# Patient Record
Sex: Female | Born: 1965 | Race: Black or African American | Hispanic: No | Marital: Single | Smoking: Former smoker
Health system: Southern US, Community
[De-identification: ages and names within clinical notes are randomized; demographics above are authoritative.]

## PROBLEM LIST (undated history)

## (undated) DIAGNOSIS — K2970 Gastritis, unspecified, without bleeding: Secondary | ICD-10-CM

## (undated) DIAGNOSIS — K589 Irritable bowel syndrome without diarrhea: Secondary | ICD-10-CM

## (undated) DIAGNOSIS — D869 Sarcoidosis, unspecified: Secondary | ICD-10-CM

## (undated) DIAGNOSIS — B9681 Helicobacter pylori [H. pylori] as the cause of diseases classified elsewhere: Secondary | ICD-10-CM

## (undated) DIAGNOSIS — R319 Hematuria, unspecified: Secondary | ICD-10-CM

## (undated) DIAGNOSIS — R5383 Other fatigue: Secondary | ICD-10-CM

## (undated) DIAGNOSIS — K219 Gastro-esophageal reflux disease without esophagitis: Secondary | ICD-10-CM

## (undated) DIAGNOSIS — I639 Cerebral infarction, unspecified: Secondary | ICD-10-CM

## (undated) DIAGNOSIS — N898 Other specified noninflammatory disorders of vagina: Secondary | ICD-10-CM

## (undated) DIAGNOSIS — I10 Essential (primary) hypertension: Secondary | ICD-10-CM

## (undated) DIAGNOSIS — N760 Acute vaginitis: Secondary | ICD-10-CM

## (undated) DIAGNOSIS — N83209 Unspecified ovarian cyst, unspecified side: Principal | ICD-10-CM

## (undated) DIAGNOSIS — R87810 Cervical high risk human papillomavirus (HPV) DNA test positive: Principal | ICD-10-CM

## (undated) DIAGNOSIS — K649 Unspecified hemorrhoids: Secondary | ICD-10-CM

## (undated) DIAGNOSIS — N189 Chronic kidney disease, unspecified: Secondary | ICD-10-CM

## (undated) DIAGNOSIS — K5900 Constipation, unspecified: Secondary | ICD-10-CM

## (undated) DIAGNOSIS — B9689 Other specified bacterial agents as the cause of diseases classified elsewhere: Secondary | ICD-10-CM

## (undated) DIAGNOSIS — E1340 Other specified diabetes mellitus with diabetic neuropathy, unspecified: Secondary | ICD-10-CM

## (undated) DIAGNOSIS — N939 Abnormal uterine and vaginal bleeding, unspecified: Principal | ICD-10-CM

## (undated) DIAGNOSIS — D219 Benign neoplasm of connective and other soft tissue, unspecified: Secondary | ICD-10-CM

## (undated) HISTORY — DX: Other specified noninflammatory disorders of vagina: N89.8

## (undated) HISTORY — PX: CHOLECYSTECTOMY: SHX55

## (undated) HISTORY — DX: Chronic kidney disease, unspecified: N18.9

## (undated) HISTORY — DX: Acute vaginitis: N76.0

## (undated) HISTORY — DX: Constipation, unspecified: K59.00

## (undated) HISTORY — DX: Cerebral infarction, unspecified: I63.9

## (undated) HISTORY — PX: HEMORRHOID SURGERY: SHX153

## (undated) HISTORY — DX: Hematuria, unspecified: R31.9

## (undated) HISTORY — DX: Unspecified ovarian cyst, unspecified side: N83.209

## (undated) HISTORY — DX: Benign neoplasm of connective and other soft tissue, unspecified: D21.9

## (undated) HISTORY — PX: TUBAL LIGATION: SHX77

## (undated) HISTORY — DX: Abnormal uterine and vaginal bleeding, unspecified: N93.9

## (undated) HISTORY — PX: ENDOMETRIAL ABLATION: SHX621

## (undated) HISTORY — DX: Unspecified hemorrhoids: K64.9

## (undated) HISTORY — DX: Other specified bacterial agents as the cause of diseases classified elsewhere: B96.89

## (undated) HISTORY — DX: Other fatigue: R53.83

## (undated) HISTORY — DX: Cervical high risk human papillomavirus (HPV) DNA test positive: R87.810

---

## 1999-01-04 ENCOUNTER — Ambulatory Visit: Admission: RE | Admit: 1999-01-04 | Discharge: 1999-01-04 | Payer: Self-pay | Admitting: Pulmonary Disease

## 2000-11-20 ENCOUNTER — Ambulatory Visit (HOSPITAL_COMMUNITY): Admission: RE | Admit: 2000-11-20 | Discharge: 2000-11-20 | Payer: Self-pay | Admitting: Pulmonary Disease

## 2001-08-12 ENCOUNTER — Ambulatory Visit (HOSPITAL_COMMUNITY): Admission: RE | Admit: 2001-08-12 | Discharge: 2001-08-12 | Payer: Self-pay | Admitting: Pulmonary Disease

## 2001-08-12 ENCOUNTER — Encounter: Payer: Self-pay | Admitting: Pulmonary Disease

## 2001-10-14 ENCOUNTER — Ambulatory Visit (HOSPITAL_COMMUNITY): Admission: RE | Admit: 2001-10-14 | Discharge: 2001-10-14 | Payer: Self-pay | Admitting: Pulmonary Disease

## 2001-10-14 ENCOUNTER — Encounter: Payer: Self-pay | Admitting: Pulmonary Disease

## 2001-12-15 ENCOUNTER — Encounter: Payer: Self-pay | Admitting: *Deleted

## 2001-12-15 ENCOUNTER — Emergency Department (HOSPITAL_COMMUNITY): Admission: EM | Admit: 2001-12-15 | Discharge: 2001-12-15 | Payer: Self-pay | Admitting: Emergency Medicine

## 2004-10-01 ENCOUNTER — Emergency Department (HOSPITAL_COMMUNITY): Admission: EM | Admit: 2004-10-01 | Discharge: 2004-10-01 | Payer: Self-pay | Admitting: *Deleted

## 2004-10-06 ENCOUNTER — Emergency Department (HOSPITAL_COMMUNITY): Admission: EM | Admit: 2004-10-06 | Discharge: 2004-10-06 | Payer: Self-pay | Admitting: Emergency Medicine

## 2005-01-11 ENCOUNTER — Other Ambulatory Visit: Admission: RE | Admit: 2005-01-11 | Discharge: 2005-01-11 | Payer: Self-pay | Admitting: Obstetrics & Gynecology

## 2005-01-31 ENCOUNTER — Ambulatory Visit (HOSPITAL_COMMUNITY): Admission: RE | Admit: 2005-01-31 | Discharge: 2005-01-31 | Payer: Self-pay | Admitting: Obstetrics & Gynecology

## 2005-12-12 ENCOUNTER — Ambulatory Visit (HOSPITAL_COMMUNITY): Admission: RE | Admit: 2005-12-12 | Discharge: 2005-12-12 | Payer: Self-pay | Admitting: Obstetrics and Gynecology

## 2005-12-18 ENCOUNTER — Emergency Department (HOSPITAL_COMMUNITY): Admission: EM | Admit: 2005-12-18 | Discharge: 2005-12-18 | Payer: Self-pay | Admitting: Emergency Medicine

## 2006-03-20 ENCOUNTER — Encounter (INDEPENDENT_AMBULATORY_CARE_PROVIDER_SITE_OTHER): Payer: Self-pay | Admitting: *Deleted

## 2006-03-20 ENCOUNTER — Ambulatory Visit (HOSPITAL_COMMUNITY): Admission: RE | Admit: 2006-03-20 | Discharge: 2006-03-20 | Payer: Self-pay | Admitting: Obstetrics & Gynecology

## 2006-12-16 ENCOUNTER — Ambulatory Visit (HOSPITAL_COMMUNITY): Admission: RE | Admit: 2006-12-16 | Discharge: 2006-12-16 | Payer: Self-pay | Admitting: Pulmonary Disease

## 2007-04-29 ENCOUNTER — Ambulatory Visit (HOSPITAL_COMMUNITY): Admission: RE | Admit: 2007-04-29 | Discharge: 2007-04-29 | Payer: Self-pay | Admitting: Optometry

## 2007-09-15 ENCOUNTER — Emergency Department (HOSPITAL_COMMUNITY): Admission: EM | Admit: 2007-09-15 | Discharge: 2007-09-15 | Payer: Self-pay | Admitting: Emergency Medicine

## 2007-11-03 ENCOUNTER — Other Ambulatory Visit: Admission: RE | Admit: 2007-11-03 | Discharge: 2007-11-03 | Payer: Self-pay | Admitting: Obstetrics and Gynecology

## 2007-12-17 ENCOUNTER — Ambulatory Visit (HOSPITAL_COMMUNITY): Admission: RE | Admit: 2007-12-17 | Discharge: 2007-12-17 | Payer: Self-pay | Admitting: Obstetrics and Gynecology

## 2008-12-21 ENCOUNTER — Ambulatory Visit (HOSPITAL_COMMUNITY): Admission: RE | Admit: 2008-12-21 | Discharge: 2008-12-21 | Payer: Self-pay | Admitting: Obstetrics and Gynecology

## 2009-01-31 ENCOUNTER — Other Ambulatory Visit: Admission: RE | Admit: 2009-01-31 | Discharge: 2009-01-31 | Payer: Self-pay | Admitting: Obstetrics & Gynecology

## 2009-12-22 ENCOUNTER — Ambulatory Visit (HOSPITAL_COMMUNITY): Admission: RE | Admit: 2009-12-22 | Discharge: 2009-12-22 | Payer: Self-pay | Admitting: Obstetrics & Gynecology

## 2010-02-06 ENCOUNTER — Other Ambulatory Visit: Admission: RE | Admit: 2010-02-06 | Discharge: 2010-02-06 | Payer: Self-pay | Admitting: Obstetrics & Gynecology

## 2010-05-16 ENCOUNTER — Emergency Department (HOSPITAL_COMMUNITY)
Admission: EM | Admit: 2010-05-16 | Discharge: 2010-05-16 | Disposition: A | Discharge status: Home / Self Care | Payer: Medicare Other | Attending: Emergency Medicine | Admitting: Emergency Medicine

## 2010-05-16 DIAGNOSIS — E7800 Pure hypercholesterolemia, unspecified: Secondary | ICD-10-CM | POA: Insufficient documentation

## 2010-05-16 DIAGNOSIS — R109 Unspecified abdominal pain: Secondary | ICD-10-CM | POA: Insufficient documentation

## 2010-05-16 DIAGNOSIS — K5289 Other specified noninfective gastroenteritis and colitis: Secondary | ICD-10-CM | POA: Insufficient documentation

## 2010-05-16 DIAGNOSIS — J99 Respiratory disorders in diseases classified elsewhere: Secondary | ICD-10-CM | POA: Insufficient documentation

## 2010-05-16 DIAGNOSIS — Z79899 Other long term (current) drug therapy: Secondary | ICD-10-CM | POA: Insufficient documentation

## 2010-05-16 DIAGNOSIS — R110 Nausea: Secondary | ICD-10-CM | POA: Insufficient documentation

## 2010-05-16 DIAGNOSIS — I10 Essential (primary) hypertension: Secondary | ICD-10-CM | POA: Insufficient documentation

## 2010-05-16 DIAGNOSIS — D869 Sarcoidosis, unspecified: Secondary | ICD-10-CM | POA: Insufficient documentation

## 2010-05-16 LAB — DIFFERENTIAL
Lymphs Abs: 2.1 10*3/uL (ref 0.7–4.0)
Monocytes Absolute: 0.7 10*3/uL (ref 0.1–1.0)
Monocytes Relative: 10 % (ref 3–12)
Neutro Abs: 4.5 10*3/uL (ref 1.7–7.7)
Neutrophils Relative %: 61 % (ref 43–77)

## 2010-05-16 LAB — URINALYSIS, ROUTINE W REFLEX MICROSCOPIC
Bilirubin Urine: NEGATIVE
Ketones, ur: NEGATIVE mg/dL
Nitrite: NEGATIVE
Specific Gravity, Urine: 1.025 (ref 1.005–1.030)
Urobilinogen, UA: 0.2 mg/dL (ref 0.0–1.0)

## 2010-05-16 LAB — BASIC METABOLIC PANEL
BUN: 6 mg/dL (ref 6–23)
CO2: 25 mEq/L (ref 19–32)
Chloride: 104 mEq/L (ref 96–112)
GFR calc Af Amer: >60 mL/min (ref >60)
Potassium: 3.7 mEq/L (ref 3.5–5.1)

## 2010-05-16 LAB — CBC
Hemoglobin: 13.6 g/dL (ref 12.0–15.0)
MCH: 29.1 pg (ref 26.0–34.0)
MCHC: 34.3 g/dL (ref 30.0–36.0)
MCV: 84.8 fL (ref 78.0–100.0)
RBC: 4.68 MIL/uL (ref 3.87–5.11)

## 2010-05-16 LAB — URINE MICROSCOPIC-ADD ON

## 2010-07-29 ENCOUNTER — Emergency Department (HOSPITAL_COMMUNITY)
Admission: EM | Admit: 2010-07-29 | Discharge: 2010-07-29 | Disposition: A | Discharge status: Home / Self Care | Payer: Medicare Other | Attending: Emergency Medicine | Admitting: Emergency Medicine

## 2010-07-29 DIAGNOSIS — IMO0002 Reserved for concepts with insufficient information to code with codable children: Secondary | ICD-10-CM | POA: Insufficient documentation

## 2010-07-29 DIAGNOSIS — I10 Essential (primary) hypertension: Secondary | ICD-10-CM | POA: Insufficient documentation

## 2010-07-29 DIAGNOSIS — E7800 Pure hypercholesterolemia, unspecified: Secondary | ICD-10-CM | POA: Insufficient documentation

## 2010-07-29 DIAGNOSIS — Z79899 Other long term (current) drug therapy: Secondary | ICD-10-CM | POA: Insufficient documentation

## 2010-07-29 DIAGNOSIS — D869 Sarcoidosis, unspecified: Secondary | ICD-10-CM | POA: Insufficient documentation

## 2010-07-29 DIAGNOSIS — N959 Unspecified menopausal and perimenopausal disorder: Secondary | ICD-10-CM | POA: Insufficient documentation

## 2010-07-29 DIAGNOSIS — N760 Acute vaginitis: Secondary | ICD-10-CM | POA: Insufficient documentation

## 2010-07-29 LAB — URINALYSIS, ROUTINE W REFLEX MICROSCOPIC
Bilirubin Urine: NEGATIVE
Glucose, UA: NEGATIVE mg/dL
Ketones, ur: NEGATIVE mg/dL
pH: 5.5 (ref 5.0–8.0)

## 2010-07-29 LAB — BASIC METABOLIC PANEL
BUN: 8 mg/dL (ref 6–23)
GFR calc non Af Amer: >60 mL/min (ref >60)
Potassium: 3.2 mEq/L — ABNORMAL LOW (ref 3.5–5.1)

## 2010-07-29 LAB — CBC
HCT: 36.5 % (ref 36.0–46.0)
Hemoglobin: 12.7 g/dL (ref 12.0–15.0)
MCV: 84.9 fL (ref 78.0–100.0)
Platelets: 228 10*3/uL (ref 150–400)
RBC: 4.3 MIL/uL (ref 3.87–5.11)
WBC: 6.9 10*3/uL (ref 4.0–10.5)

## 2010-07-29 LAB — DIFFERENTIAL
Eosinophils Absolute: 0 10*3/uL (ref 0.0–0.7)
Lymphocytes Relative: 24 % (ref 12–46)
Lymphs Abs: 1.7 10*3/uL (ref 0.7–4.0)
Neutro Abs: 4.5 10*3/uL (ref 1.7–7.7)
Neutrophils Relative %: 65 % (ref 43–77)

## 2010-07-29 LAB — RPR: RPR Ser Ql: NONREACTIVE

## 2010-07-29 LAB — WET PREP, GENITAL: WBC, Wet Prep HPF POC: NONE SEEN

## 2010-07-29 LAB — URINE MICROSCOPIC-ADD ON

## 2010-07-31 ENCOUNTER — Other Ambulatory Visit (HOSPITAL_COMMUNITY): Payer: Self-pay | Admitting: Optometry

## 2010-07-31 DIAGNOSIS — IMO0002 Reserved for concepts with insufficient information to code with codable children: Secondary | ICD-10-CM

## 2010-08-07 ENCOUNTER — Encounter (HOSPITAL_COMMUNITY)
Admission: RE | Admit: 2010-08-07 | Discharge: 2010-08-07 | Disposition: A | Discharge status: Home / Self Care | Payer: Medicare Other | Source: Ambulatory Visit | Attending: Optometry | Admitting: Optometry

## 2010-08-07 ENCOUNTER — Other Ambulatory Visit (HOSPITAL_COMMUNITY): Payer: Self-pay | Admitting: Optometry

## 2010-08-07 DIAGNOSIS — R222 Localized swelling, mass and lump, trunk: Secondary | ICD-10-CM | POA: Insufficient documentation

## 2010-08-07 DIAGNOSIS — J984 Other disorders of lung: Secondary | ICD-10-CM | POA: Insufficient documentation

## 2010-08-07 DIAGNOSIS — IMO0002 Reserved for concepts with insufficient information to code with codable children: Secondary | ICD-10-CM

## 2010-08-07 DIAGNOSIS — Z79899 Other long term (current) drug therapy: Secondary | ICD-10-CM | POA: Insufficient documentation

## 2010-08-07 DIAGNOSIS — M899 Disorder of bone, unspecified: Secondary | ICD-10-CM | POA: Insufficient documentation

## 2010-08-07 DIAGNOSIS — M7989 Other specified soft tissue disorders: Secondary | ICD-10-CM | POA: Insufficient documentation

## 2010-08-07 LAB — GLUCOSE, CAPILLARY: Glucose-Capillary: 97 mg/dL (ref 70–99)

## 2010-08-07 MED ORDER — FLUDEOXYGLUCOSE F - 18 (FDG) INJECTION
18.3000 | Freq: Once | INTRAVENOUS | Status: AC | PRN
  Administered 2010-08-07: 18.3 via INTRAVENOUS

## 2010-08-18 NOTE — Op Note (Signed)
[name], [name]            ACCOUNT NO.:  [id number]   MEDICAL RECORD NO.:  [id number]          PATIENT TYPE:  AMB   LOCATION:  DAY                           FACILITY:  [facility]   PHYSICIAN:  Lazaro Arms, M.D.   DATE OF BIRTH:  Oct 22, 1965   DATE OF PROCEDURE:  03/20/2006  DATE OF DISCHARGE:                               OPERATIVE REPORT   PREOPERATIVE DIAGNOSES:  1. Menometrorrhagia.  2. Dysmenorrhea.   POSTOPERATIVE DIAGNOSES:  1. Menometrorrhagia.  2. Dysmenorrhea.   PROCEDURE:  1. Hysteroscopy.  2. Dilatation and curettage.  3. Endometrial ablation.   SURGEON:  Lazaro Arms, M.D.   ANESTHESIA:  General endotracheal.   FINDINGS:  The patient had a normal endometrial cavity.  No fibroids, no  polyps.  No abnormalities.   DESCRIPTION OF PROCEDURE:  The patient was taken to the operating room  and placed in the supine position.  She underwent general endotracheal  anesthesia.  She was then placed in the dorsal lithotomy position and  prepped and draped in the usual sterile fashion.  A graded speculum was  placed.  The cervix was grasped.  The cervix was dilated serially to  __________ with the hysteroscope.  Hysteroscopy was performed and there  was a completely normal endometrial cavity.  A vigorous uterine  curettage was then performed and there was good uterine cry obtained in  all areas.  The ThermaChoice-3 endometrial ablation balloon was then  placed.  It required 32 mL of D-5-W to maintain a pressure to 192 mmHg.  It was heated to 87 degrees Celsius.  The total therapy time was 10  minutes and 9 seconds.  Fluid was removed at the end of the procedure.   The patient tolerated the procedure well.  She experienced minimal blood  loss and was taken to the recovery room in good stable condition.  All  counts were correct x3.      Lazaro Arms, M.D.  Electronically Signed     LHE/MEDQ  D:  03/20/2006  T:  03/20/2006  Job:  [id number]

## 2010-08-18 NOTE — Op Note (Signed)
[name], [name]            ACCOUNT NO.:  [id number]   MEDICAL RECORD NO.:  [id number]          PATIENT TYPE:  AMB   LOCATION:  DAY                           FACILITY:  [facility]   PHYSICIAN:  Lazaro Arms, M.D.   DATE OF BIRTH:  [birth year]   DATE OF PROCEDURE:  01/31/2005  DATE OF DISCHARGE:                                 OPERATIVE REPORT   PREOPERATIVE DIAGNOSIS:  Moderate cervical dysplasia.   POSTOPERATIVE DIAGNOSIS:  Moderate cervical dysplasia.   OPERATION PERFORMED:  Laser ablation of the cervix.   SURGEON:  Lazaro Arms, M.D.   ANESTHESIA:  MAC.   FINDINGS:  The patient had a colposcopic directed biopsy in the office that  came back moderate dysplasia. This certainly looked high grade and her Pap  smear showed high grade.  As a result we proceeded with laser ablation to be  most conservative.   DESCRIPTION OF PROCEDURE:  The patient was taken to the operating room and  placed in supine position where she underwent IV sedation. She was placed in  dorsal lithotomy position.  I did a paracervical block using 0.5% Marcaine.  3% acetic acid was placed.  The microscope was used and the holmium laser  was used to ablate the cervix to a depth of 5 to 7 mm laterally and 7 to 9  mm centrally in conical fashion. There was no bleeding.  The patient  tolerated the procedure well.  She was taken to recovery room in good and  stable condition.  All counts were correct.      Lazaro Arms, M.D.  Electronically Signed     LHE/MEDQ  D:  01/31/2005  T:  01/31/2005  Job:  [id number]

## 2010-08-18 NOTE — H&P (Signed)
[name], [name] NO.:  [id number]   MEDICAL RECORD NO.:  [id number]          PATIENT TYPE:  AMB   LOCATION:                                FACILITY:  [facility]   PHYSICIAN:  Lazaro Arms, M.D.   DATE OF BIRTH:  December 10, 1965   DATE OF ADMISSION:  DATE OF DISCHARGE:  LH                                HISTORY & PHYSICAL   HISTORY OF PRESENT ILLNESS:  [name] is a 45 year old African American  female, gravida 3, para 2, status post tubal ligation who had an abnormal  Pap smear obtained in our office in September.  It was an ASCUS Pap smear  but could not exclude high-grade dysplasia with positive HPV.  Subsequently  I did a colposcopy with directed biopsies on December 12, 2004 which  returned as moderate grade dysplasia.  However, to me it actually looked  more severe with punctation and mosaicism  and dense acetowhite changes.  As  a result we are going to proceed with the laser ablation of the cervix.   PAST MEDICAL HISTORY:  Negative.   PAST SURGICAL HISTORY:  Tubal ligation.   OB HISTORY:  Vaginal deliveries and a miscarriage.   ALLERGIES:  CODEINE.   MEDICATIONS:  None listed.   REVIEW OF SYSTEMS:  Otherwise negative.   PHYSICAL EXAMINATION:  HEENT:  Unremarkable.  NECK:  Thyroid normal.  LUNGS:  Clear.  HEART:  Regular rate and rhythm.  No murmur, rub, or gallop.  BREASTS:  Deferred.  ABDOMEN:  Benign.  No hepatosplenomegaly or masses.  GU:  Normal external genitalia.  Vagina pink, moist  without discharge.  PELVIC:  Cervix parous without  lesions.  Uterus normal size, shape and  contour.  Ovaries normal, nontender.  EXTREMITIES:  Warm with no edema.  NEUROLOGIC:  Grossly intact.   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   PLAN:  The patient is admitted for laser ablation of the cervix.  She  understands the risks, benefits, indications, alternatives and will proceed.      Lazaro Arms, M.D.  Electronically Signed     LHE/MEDQ  D:  01/30/2005  T:  01/31/2005  Job:  [id number]

## 2010-10-19 ENCOUNTER — Encounter: Payer: Self-pay | Admitting: *Deleted

## 2010-10-19 DIAGNOSIS — K649 Unspecified hemorrhoids: Secondary | ICD-10-CM | POA: Insufficient documentation

## 2010-10-19 NOTE — ED Notes (Signed)
Hemorrhoid pain

## 2010-10-20 ENCOUNTER — Emergency Department (HOSPITAL_COMMUNITY)
Admission: EM | Admit: 2010-10-20 | Discharge: 2010-10-20 | Discharge status: Against Medical Advice | Payer: Medicare Other | Attending: Emergency Medicine | Admitting: Emergency Medicine

## 2010-10-20 DIAGNOSIS — K644 Residual hemorrhoidal skin tags: Secondary | ICD-10-CM

## 2010-10-20 HISTORY — DX: Sarcoidosis, unspecified: D86.9

## 2010-10-20 HISTORY — DX: Essential (primary) hypertension: I10

## 2010-10-20 MED ORDER — HYDROCODONE-ACETAMINOPHEN 5-325 MG PO TABS
1.0000 | ORAL_TABLET | Freq: Once | ORAL | Status: AC
  Administered 2010-10-20: 1 via ORAL
  Filled 2010-10-20: qty 1

## 2010-10-20 MED ORDER — HYDROCORTISONE ACETATE 25 MG RE SUPP
25.0000 mg | Freq: Two times a day (BID) | RECTAL | Status: DC
  Administered 2010-10-20: 25 mg via RECTAL
  Filled 2010-10-20: qty 1

## 2010-10-20 MED ORDER — HYDROCORTISONE ACETATE 25 MG RE SUPP
RECTAL | Status: AC
  Filled 2010-10-20: qty 1

## 2010-10-20 NOTE — ED Provider Notes (Signed)
History     Chief Complaint  Patient presents with  . Hemorrhoids   HPI Comments: Patient with a h/o hemorrhoids who is here with increased pain and bleeding of external hemorrhoids. She has a Rx for anusol suppositories however has not used them in several days. Denies fever, chills, abdominal pain. States stools are not hard but are not diarrhea. Patient with annual colonoscopies for until recently for continued c/o pain.  The history is provided by the patient.    Past Medical History  Diagnosis Date  . Sarcoidosis   . Diabetes mellitus   . Hypertension   . Asthma     Past Surgical History  Procedure Date  . Tubal ligation   . Endometrial ablation   . Cholecystectomy     Family History  Problem Relation Age of Onset  . Diabetes Father     History  Substance Use Topics  . Smoking status: Former Games developer  . Smokeless tobacco: Not on file  . Alcohol Use: No    OB History    Grav Para Term Preterm Abortions TAB SAB Ect Mult Living                  Review of Systems  Gastrointestinal: Positive for rectal pain.  All other systems reviewed and are negative.    Physical Exam  BP 126/81  Pulse 112  Temp(Src) 98.3 F (36.8 C) (Oral)  Resp 16  Ht 5\' 6"  (1.676 m)  Wt 189 lb (85.73 kg)  BMI 30.51 kg/m2  SpO2 99%  LMP 10/18/2005  Physical Exam  Nursing note and vitals reviewed. Constitutional: She is oriented to person, place, and time. She appears well-developed and well-nourished.  HENT:  Head: Normocephalic and atraumatic.  Right Ear: External ear normal.  Left Ear: External ear normal.  Eyes: EOM are normal. Pupils are equal, round, and reactive to light.  Neck: Normal range of motion. Neck supple.  Cardiovascular: Regular rhythm and normal heart sounds.        tachycardia  Pulmonary/Chest: Effort normal and breath sounds normal.  Abdominal: Soft. Bowel sounds are normal.       External hemorrhoids. Irritated, no thrombosis.  Musculoskeletal: Normal  range of motion.  Neurological: She is alert and oriented to person, place, and time. She has normal reflexes.  Skin: Skin is warm and dry.    ED Course  Procedures  MDM       Nicoletta Dress. Colon Branch, MD 10/20/10 [phone]

## 2010-11-16 ENCOUNTER — Other Ambulatory Visit: Payer: Self-pay | Admitting: Obstetrics & Gynecology

## 2010-11-16 DIAGNOSIS — Z139 Encounter for screening, unspecified: Secondary | ICD-10-CM

## 2010-12-25 ENCOUNTER — Ambulatory Visit (HOSPITAL_COMMUNITY)
Admission: RE | Admit: 2010-12-25 | Discharge: 2010-12-25 | Disposition: A | Discharge status: Home / Self Care | Payer: Medicare Other | Source: Ambulatory Visit | Attending: Obstetrics & Gynecology | Admitting: Obstetrics & Gynecology

## 2010-12-25 DIAGNOSIS — Z1231 Encounter for screening mammogram for malignant neoplasm of breast: Secondary | ICD-10-CM | POA: Insufficient documentation

## 2010-12-25 DIAGNOSIS — Z139 Encounter for screening, unspecified: Secondary | ICD-10-CM

## 2010-12-28 LAB — COMPREHENSIVE METABOLIC PANEL
ALT: 36 — ABNORMAL HIGH
AST: 42 — ABNORMAL HIGH
Albumin: 3.6
Alkaline Phosphatase: 59
CO2: 29
Chloride: 103
GFR calc Af Amer: >60
GFR calc non Af Amer: >60
Potassium: 3.7
Sodium: 138
Total Bilirubin: 0.7

## 2010-12-28 LAB — CBC
MCV: 89.3
RBC: 4.41
WBC: 8.7

## 2010-12-28 LAB — DIFFERENTIAL
Basophils Absolute: 0
Eosinophils Absolute: 0
Eosinophils Relative: 0
Monocytes Absolute: 0.3

## 2010-12-28 LAB — D-DIMER, QUANTITATIVE: D-Dimer, Quant: 0.33

## 2011-02-09 ENCOUNTER — Other Ambulatory Visit: Payer: Self-pay | Admitting: Adult Health

## 2011-02-09 ENCOUNTER — Other Ambulatory Visit (HOSPITAL_COMMUNITY)
Admission: RE | Admit: 2011-02-09 | Discharge: 2011-02-09 | Disposition: A | Discharge status: Home / Self Care | Payer: Medicare Other | Source: Ambulatory Visit | Attending: Obstetrics and Gynecology | Admitting: Obstetrics and Gynecology

## 2011-02-09 DIAGNOSIS — Z124 Encounter for screening for malignant neoplasm of cervix: Secondary | ICD-10-CM | POA: Insufficient documentation

## 2011-04-13 DIAGNOSIS — D869 Sarcoidosis, unspecified: Secondary | ICD-10-CM | POA: Diagnosis not present

## 2011-04-16 DIAGNOSIS — H04129 Dry eye syndrome of unspecified lacrimal gland: Secondary | ICD-10-CM | POA: Diagnosis not present

## 2011-04-19 DIAGNOSIS — R222 Localized swelling, mass and lump, trunk: Secondary | ICD-10-CM | POA: Diagnosis not present

## 2011-04-19 DIAGNOSIS — J984 Other disorders of lung: Secondary | ICD-10-CM | POA: Diagnosis not present

## 2011-04-19 DIAGNOSIS — D152 Benign neoplasm of mediastinum: Secondary | ICD-10-CM | POA: Diagnosis not present

## 2011-04-19 DIAGNOSIS — R0609 Other forms of dyspnea: Secondary | ICD-10-CM | POA: Diagnosis not present

## 2011-04-19 DIAGNOSIS — R0602 Shortness of breath: Secondary | ICD-10-CM | POA: Diagnosis not present

## 2011-04-19 DIAGNOSIS — D869 Sarcoidosis, unspecified: Secondary | ICD-10-CM | POA: Diagnosis not present

## 2011-05-18 DIAGNOSIS — IMO0001 Reserved for inherently not codable concepts without codable children: Secondary | ICD-10-CM | POA: Diagnosis not present

## 2011-05-18 DIAGNOSIS — D869 Sarcoidosis, unspecified: Secondary | ICD-10-CM | POA: Diagnosis not present

## 2011-05-18 DIAGNOSIS — E782 Mixed hyperlipidemia: Secondary | ICD-10-CM | POA: Diagnosis not present

## 2011-05-18 DIAGNOSIS — K2970 Gastritis, unspecified, without bleeding: Secondary | ICD-10-CM | POA: Diagnosis not present

## 2011-05-18 DIAGNOSIS — I209 Angina pectoris, unspecified: Secondary | ICD-10-CM | POA: Diagnosis not present

## 2011-05-18 DIAGNOSIS — G932 Benign intracranial hypertension: Secondary | ICD-10-CM | POA: Diagnosis not present

## 2011-05-18 DIAGNOSIS — K644 Residual hemorrhoidal skin tags: Secondary | ICD-10-CM | POA: Diagnosis not present

## 2011-05-18 DIAGNOSIS — K2990 Gastroduodenitis, unspecified, without bleeding: Secondary | ICD-10-CM | POA: Diagnosis not present

## 2011-05-18 DIAGNOSIS — J209 Acute bronchitis, unspecified: Secondary | ICD-10-CM | POA: Diagnosis not present

## 2011-05-18 DIAGNOSIS — J984 Other disorders of lung: Secondary | ICD-10-CM | POA: Diagnosis not present

## 2011-05-22 DIAGNOSIS — F39 Unspecified mood [affective] disorder: Secondary | ICD-10-CM | POA: Diagnosis not present

## 2011-05-29 DIAGNOSIS — N938 Other specified abnormal uterine and vaginal bleeding: Secondary | ICD-10-CM | POA: Diagnosis not present

## 2011-05-29 DIAGNOSIS — D259 Leiomyoma of uterus, unspecified: Secondary | ICD-10-CM | POA: Diagnosis not present

## 2011-05-29 DIAGNOSIS — N949 Unspecified condition associated with female genital organs and menstrual cycle: Secondary | ICD-10-CM | POA: Diagnosis not present

## 2011-06-08 DIAGNOSIS — D869 Sarcoidosis, unspecified: Secondary | ICD-10-CM | POA: Diagnosis not present

## 2011-06-12 DIAGNOSIS — N949 Unspecified condition associated with female genital organs and menstrual cycle: Secondary | ICD-10-CM | POA: Diagnosis not present

## 2011-06-12 DIAGNOSIS — Z1389 Encounter for screening for other disorder: Secondary | ICD-10-CM | POA: Diagnosis not present

## 2011-06-12 DIAGNOSIS — N938 Other specified abnormal uterine and vaginal bleeding: Secondary | ICD-10-CM | POA: Diagnosis not present

## 2011-07-07 DIAGNOSIS — E119 Type 2 diabetes mellitus without complications: Secondary | ICD-10-CM | POA: Diagnosis not present

## 2011-07-07 DIAGNOSIS — H5710 Ocular pain, unspecified eye: Secondary | ICD-10-CM | POA: Diagnosis not present

## 2011-07-07 DIAGNOSIS — H04129 Dry eye syndrome of unspecified lacrimal gland: Secondary | ICD-10-CM | POA: Diagnosis not present

## 2011-07-09 DIAGNOSIS — H04129 Dry eye syndrome of unspecified lacrimal gland: Secondary | ICD-10-CM | POA: Diagnosis not present

## 2011-07-20 DIAGNOSIS — E782 Mixed hyperlipidemia: Secondary | ICD-10-CM | POA: Diagnosis not present

## 2011-07-20 DIAGNOSIS — IMO0001 Reserved for inherently not codable concepts without codable children: Secondary | ICD-10-CM | POA: Diagnosis not present

## 2011-07-20 DIAGNOSIS — G608 Other hereditary and idiopathic neuropathies: Secondary | ICD-10-CM | POA: Diagnosis not present

## 2011-07-24 DIAGNOSIS — N764 Abscess of vulva: Secondary | ICD-10-CM | POA: Diagnosis not present

## 2011-07-24 DIAGNOSIS — R319 Hematuria, unspecified: Secondary | ICD-10-CM | POA: Diagnosis not present

## 2011-07-24 DIAGNOSIS — N920 Excessive and frequent menstruation with regular cycle: Secondary | ICD-10-CM | POA: Diagnosis not present

## 2011-08-07 DIAGNOSIS — K921 Melena: Secondary | ICD-10-CM | POA: Diagnosis not present

## 2011-08-13 DIAGNOSIS — H02209 Unspecified lagophthalmos unspecified eye, unspecified eyelid: Secondary | ICD-10-CM | POA: Diagnosis not present

## 2011-08-13 DIAGNOSIS — H04129 Dry eye syndrome of unspecified lacrimal gland: Secondary | ICD-10-CM | POA: Diagnosis not present

## 2011-08-17 DIAGNOSIS — F411 Generalized anxiety disorder: Secondary | ICD-10-CM | POA: Diagnosis not present

## 2011-08-18 DIAGNOSIS — Z5320 Procedure and treatment not carried out because of patient's decision for unspecified reasons: Secondary | ICD-10-CM | POA: Diagnosis not present

## 2011-08-18 DIAGNOSIS — R21 Rash and other nonspecific skin eruption: Secondary | ICD-10-CM | POA: Diagnosis not present

## 2011-08-30 DIAGNOSIS — H04129 Dry eye syndrome of unspecified lacrimal gland: Secondary | ICD-10-CM | POA: Diagnosis not present

## 2011-08-30 DIAGNOSIS — H0289 Other specified disorders of eyelid: Secondary | ICD-10-CM | POA: Diagnosis not present

## 2011-08-30 DIAGNOSIS — H02209 Unspecified lagophthalmos unspecified eye, unspecified eyelid: Secondary | ICD-10-CM | POA: Diagnosis not present

## 2011-08-30 DIAGNOSIS — H02429 Myogenic ptosis of unspecified eyelid: Secondary | ICD-10-CM | POA: Diagnosis not present

## 2011-09-24 DIAGNOSIS — H04569 Stenosis of unspecified lacrimal punctum: Secondary | ICD-10-CM | POA: Diagnosis not present

## 2011-09-24 DIAGNOSIS — H04129 Dry eye syndrome of unspecified lacrimal gland: Secondary | ICD-10-CM | POA: Diagnosis not present

## 2011-10-09 DIAGNOSIS — Z9889 Other specified postprocedural states: Secondary | ICD-10-CM | POA: Diagnosis not present

## 2011-10-09 DIAGNOSIS — H04209 Unspecified epiphora, unspecified lacrimal gland: Secondary | ICD-10-CM | POA: Diagnosis not present

## 2011-10-09 DIAGNOSIS — Z48810 Encounter for surgical aftercare following surgery on the sense organs: Secondary | ICD-10-CM | POA: Diagnosis not present

## 2011-10-14 DIAGNOSIS — R0602 Shortness of breath: Secondary | ICD-10-CM | POA: Diagnosis not present

## 2011-10-14 DIAGNOSIS — Z87891 Personal history of nicotine dependence: Secondary | ICD-10-CM | POA: Diagnosis not present

## 2011-10-14 DIAGNOSIS — Z79899 Other long term (current) drug therapy: Secondary | ICD-10-CM | POA: Diagnosis not present

## 2011-10-14 DIAGNOSIS — Z7982 Long term (current) use of aspirin: Secondary | ICD-10-CM | POA: Diagnosis not present

## 2011-10-14 DIAGNOSIS — L272 Dermatitis due to ingested food: Secondary | ICD-10-CM | POA: Diagnosis not present

## 2011-10-19 DIAGNOSIS — J309 Allergic rhinitis, unspecified: Secondary | ICD-10-CM | POA: Diagnosis not present

## 2011-10-24 DIAGNOSIS — I10 Essential (primary) hypertension: Secondary | ICD-10-CM | POA: Diagnosis not present

## 2011-10-24 DIAGNOSIS — F411 Generalized anxiety disorder: Secondary | ICD-10-CM | POA: Diagnosis not present

## 2011-10-24 DIAGNOSIS — E119 Type 2 diabetes mellitus without complications: Secondary | ICD-10-CM | POA: Diagnosis not present

## 2011-10-24 DIAGNOSIS — R0602 Shortness of breath: Secondary | ICD-10-CM | POA: Diagnosis not present

## 2011-10-24 DIAGNOSIS — L259 Unspecified contact dermatitis, unspecified cause: Secondary | ICD-10-CM | POA: Diagnosis not present

## 2011-10-24 DIAGNOSIS — Z7982 Long term (current) use of aspirin: Secondary | ICD-10-CM | POA: Diagnosis not present

## 2011-10-24 DIAGNOSIS — Z79899 Other long term (current) drug therapy: Secondary | ICD-10-CM | POA: Diagnosis not present

## 2011-11-16 DIAGNOSIS — R21 Rash and other nonspecific skin eruption: Secondary | ICD-10-CM | POA: Diagnosis not present

## 2011-11-27 ENCOUNTER — Other Ambulatory Visit: Payer: Self-pay | Admitting: Obstetrics and Gynecology

## 2011-11-27 DIAGNOSIS — IMO0001 Reserved for inherently not codable concepts without codable children: Secondary | ICD-10-CM

## 2011-11-30 DIAGNOSIS — H04129 Dry eye syndrome of unspecified lacrimal gland: Secondary | ICD-10-CM | POA: Diagnosis not present

## 2011-11-30 DIAGNOSIS — H02209 Unspecified lagophthalmos unspecified eye, unspecified eyelid: Secondary | ICD-10-CM | POA: Diagnosis not present

## 2011-12-04 DIAGNOSIS — F411 Generalized anxiety disorder: Secondary | ICD-10-CM | POA: Diagnosis not present

## 2011-12-06 ENCOUNTER — Ambulatory Visit (HOSPITAL_COMMUNITY)
Admission: RE | Admit: 2011-12-06 | Discharge: 2011-12-06 | Disposition: A | Discharge status: Home / Self Care | Payer: Medicare Other | Source: Ambulatory Visit | Attending: Obstetrics and Gynecology | Admitting: Obstetrics and Gynecology

## 2011-12-06 DIAGNOSIS — IMO0001 Reserved for inherently not codable concepts without codable children: Secondary | ICD-10-CM

## 2011-12-06 DIAGNOSIS — Z1231 Encounter for screening mammogram for malignant neoplasm of breast: Secondary | ICD-10-CM | POA: Diagnosis not present

## 2011-12-15 ENCOUNTER — Encounter (HOSPITAL_COMMUNITY): Payer: Self-pay | Admitting: *Deleted

## 2011-12-15 ENCOUNTER — Emergency Department (HOSPITAL_COMMUNITY)
Admission: EM | Admit: 2011-12-15 | Discharge: 2011-12-16 | Disposition: A | Discharge status: Home / Self Care | Payer: Medicare Other | Attending: Emergency Medicine | Admitting: Emergency Medicine

## 2011-12-15 DIAGNOSIS — M5430 Sciatica, unspecified side: Secondary | ICD-10-CM | POA: Insufficient documentation

## 2011-12-15 DIAGNOSIS — I10 Essential (primary) hypertension: Secondary | ICD-10-CM | POA: Insufficient documentation

## 2011-12-15 DIAGNOSIS — Z87891 Personal history of nicotine dependence: Secondary | ICD-10-CM | POA: Insufficient documentation

## 2011-12-15 DIAGNOSIS — M545 Low back pain, unspecified: Secondary | ICD-10-CM | POA: Diagnosis not present

## 2011-12-15 DIAGNOSIS — M5440 Lumbago with sciatica, unspecified side: Secondary | ICD-10-CM

## 2011-12-15 DIAGNOSIS — E119 Type 2 diabetes mellitus without complications: Secondary | ICD-10-CM | POA: Diagnosis not present

## 2011-12-15 DIAGNOSIS — D869 Sarcoidosis, unspecified: Secondary | ICD-10-CM | POA: Insufficient documentation

## 2011-12-15 LAB — CBC WITH DIFFERENTIAL/PLATELET
Eosinophils Absolute: 0.1 10*3/uL (ref 0.0–0.7)
Hemoglobin: 11.8 g/dL — ABNORMAL LOW (ref 12.0–15.0)
Lymphocytes Relative: 40 % (ref 12–46)
Lymphs Abs: 2.7 10*3/uL (ref 0.7–4.0)
MCH: 29.2 pg (ref 26.0–34.0)
MCV: 83.9 fL (ref 78.0–100.0)
Monocytes Relative: 8 % (ref 3–12)
Neutrophils Relative %: 50 % (ref 43–77)
Platelets: 252 10*3/uL (ref 150–400)
RBC: 4.04 MIL/uL (ref 3.87–5.11)
WBC: 6.7 10*3/uL (ref 4.0–10.5)

## 2011-12-15 LAB — URINALYSIS, ROUTINE W REFLEX MICROSCOPIC
Bilirubin Urine: NEGATIVE
Hgb urine dipstick: NEGATIVE
Nitrite: NEGATIVE
Specific Gravity, Urine: 1.016 (ref 1.005–1.030)
Urobilinogen, UA: 1 mg/dL (ref 0.0–1.0)
pH: 5.5 (ref 5.0–8.0)

## 2011-12-15 LAB — COMPREHENSIVE METABOLIC PANEL
ALT: 34 U/L (ref 0–35)
AST: 31 U/L (ref 0–37)
Albumin: 3.8 g/dL (ref 3.5–5.2)
Alkaline Phosphatase: 41 U/L (ref 39–117)
BUN: 8 mg/dL (ref 6–23)
CO2: 23 mEq/L (ref 19–32)
Calcium: 9.5 mg/dL (ref 8.4–10.5)
Chloride: 107 mEq/L (ref 96–112)
Creatinine, Ser: 0.72 mg/dL (ref 0.50–1.10)
GFR calc Af Amer: >90 mL/min (ref >90)
GFR calc non Af Amer: >90 mL/min (ref >90)
Glucose, Bld: 119 mg/dL — ABNORMAL HIGH (ref 70–99)
Potassium: 3.9 mEq/L (ref 3.5–5.1)
Sodium: 141 mEq/L (ref 135–145)
Total Bilirubin: 0.2 mg/dL — ABNORMAL LOW (ref 0.3–1.2)
Total Protein: 7.6 g/dL (ref 6.0–8.3)

## 2011-12-15 MED ORDER — OXYCODONE-ACETAMINOPHEN 5-325 MG PO TABS
2.0000 | ORAL_TABLET | Freq: Once | ORAL | Status: AC
  Administered 2011-12-15: 2 via ORAL
  Filled 2011-12-15: qty 2

## 2011-12-15 MED ORDER — DIAZEPAM 5 MG PO TABS
5.0000 mg | ORAL_TABLET | Freq: Once | ORAL | Status: AC
  Administered 2011-12-15: 5 mg via ORAL
  Filled 2011-12-15: qty 1

## 2011-12-15 NOTE — ED Notes (Signed)
MD at bedside. 

## 2011-12-15 NOTE — ED Notes (Signed)
The pt is c/o lt sided abd and flank pain for 2 weeks.  She has sarcoidosis.  lmp none

## 2011-12-16 MED ORDER — OXYCODONE-ACETAMINOPHEN 5-325 MG PO TABS: 2.0000 | ORAL_TABLET | ORAL | Status: AC | PRN

## 2011-12-16 MED ORDER — DIAZEPAM 5 MG PO TABS: 5.0000 mg | ORAL_TABLET | Freq: Three times a day (TID) | ORAL | Status: AC | PRN

## 2011-12-17 NOTE — ED Provider Notes (Signed)
History     CSN: [id number]  Arrival date & time 12/15/11  2009   First MD Initiated Contact with Patient 12/15/11 2301      Chief Complaint  Patient presents with  . Abdominal Pain    (Consider location/radiation/quality/duration/timing/severity/associated sxs/prior treatment) HPI 46 year old female presents to emergency room complaining of left lower back pain ongoing for last 2 weeks. She denies any injury to the area, no new activities. Patient reports pain radiates into her left leg. Pain is worse when going upstairs forward trying to get out of a chair. She denies any bowel or bladder dysfunction, no difficulties with walking, no loss of function. She denies any anterior abdominal pain, no nausea no vomiting no dysuria.  Past Medical History  Diagnosis Date  . Sarcoidosis   . Diabetes mellitus   . Hypertension   . Asthma     Past Surgical History  Procedure Date  . Tubal ligation   . Endometrial ablation   . Cholecystectomy     Family History  Problem Relation Age of Onset  . Diabetes Father     History  Substance Use Topics  . Smoking status: Former Games developer  . Smokeless tobacco: Not on file  . Alcohol Use: No    OB History    Grav Para Term Preterm Abortions TAB SAB Ect Mult Living                  Review of Systems  All other systems reviewed and are negative.    Allergies  Shellfish allergy  Home Medications   Current Outpatient Rx  Name Route Sig Dispense Refill  . ASPIRIN EC 81 MG PO TBEC Oral Take 81 mg by mouth daily.    Marland Kitchen BENAZEPRIL HCL 10 MG PO TABS Oral Take 10 mg by mouth daily.     Marland Kitchen DOXEPIN HCL 25 MG PO CAPS Oral Take 25 mg by mouth at bedtime.    . FENOFIBRATE 145 MG PO TABS Oral Take 145 mg by mouth daily.     Marland Kitchen HYDROXYZINE PAMOATE 50 MG PO CAPS Oral Take 50 mg by mouth 3 (three) times daily.    . MEGESTROL ACETATE 40 MG PO TABS Oral Take 40 mg by mouth daily.    Marland Kitchen METFORMIN HCL 1000 MG PO TABS Oral Take 1,000 mg by mouth 2  (two) times daily with a meal.     . PRESCRIPTION MEDICATION Both Eyes Place 4 drops into both eyes 4 (four) times daily. Eye drops compounded at [facility] from patient's blood    . RANITIDINE HCL 150 MG PO TABS Oral Take 150 mg by mouth 2 (two) times daily.    Marland Kitchen DIAZEPAM 5 MG PO TABS Oral Take 1 tablet (5 mg total) by mouth every 8 (eight) hours as needed for anxiety (muscle spasm). 15 tablet 0  . OXYCODONE-ACETAMINOPHEN 5-325 MG PO TABS Oral Take 2 tablets by mouth every 4 (four) hours as needed for pain. 20 tablet 0    BP 121/95  Pulse 64  Temp 98.4 F (36.9 C) (Oral)  Resp 16  SpO2 100%  Physical Exam  Nursing note and vitals reviewed. Constitutional: She is oriented to person, place, and time. She appears well-developed and well-nourished.  HENT:  Head: Normocephalic and atraumatic.  Nose: Nose normal.  Mouth/Throat: Oropharynx is clear and moist.  Eyes: Conjunctivae normal and EOM are normal. Pupils are equal, round, and reactive to light.  Neck: Normal range of motion. Neck supple. No JVD  present. No tracheal deviation present. No thyromegaly present.  Cardiovascular: Normal rate, regular rhythm, normal heart sounds and intact distal pulses.  Exam reveals no gallop and no friction rub.   No murmur heard. Pulmonary/Chest: Effort normal and breath sounds normal. No stridor. No respiratory distress. She has no wheezes. She has no rales. She exhibits no tenderness.  Abdominal: Soft. Bowel sounds are normal. She exhibits no distension and no mass. There is no tenderness. There is no rebound and no guarding.  Musculoskeletal: Normal range of motion. She exhibits tenderness (Patient with tenderness to palpation in left SI joint as well as paraspinal lumbar region. Straight leg raise is positive on the left). She exhibits no edema.  Lymphadenopathy:    She has no cervical adenopathy.  Neurological: She is alert and oriented to person, place, and time. She has normal reflexes. No  cranial nerve deficit. She exhibits normal muscle tone. Coordination normal.  Skin: Skin is warm and dry. No rash noted. No erythema. No pallor.  Psychiatric: She has a normal mood and affect. Her behavior is normal. Judgment and thought content normal.    ED Course  Procedures (including critical care time)  Labs Reviewed  CBC WITH DIFFERENTIAL - Abnormal; Notable for the following:    Hemoglobin 11.8 (*)     HCT 33.9 (*)     All other components within normal limits  COMPREHENSIVE METABOLIC PANEL - Abnormal; Notable for the following:    Glucose, Bld 119 (*)     Total Bilirubin 0.2 (*)     All other components within normal limits  URINALYSIS, ROUTINE W REFLEX MICROSCOPIC  LAB REPORT - SCANNED   No results found.   1. Acute back pain with sciatica       MDM  46 year old female with left lower back pain, no abdominal pain, suspect sciatica. To followup with her primary care Dr. and/or orthopedics as needed for back pain        Olivia Mackie, MD 12/17/11 [phone]

## 2011-12-20 DIAGNOSIS — M5430 Sciatica, unspecified side: Secondary | ICD-10-CM | POA: Diagnosis not present

## 2011-12-21 DIAGNOSIS — D869 Sarcoidosis, unspecified: Secondary | ICD-10-CM | POA: Diagnosis not present

## 2012-01-03 DIAGNOSIS — R0609 Other forms of dyspnea: Secondary | ICD-10-CM | POA: Diagnosis not present

## 2012-01-03 DIAGNOSIS — R0989 Other specified symptoms and signs involving the circulatory and respiratory systems: Secondary | ICD-10-CM | POA: Diagnosis not present

## 2012-01-03 DIAGNOSIS — R0602 Shortness of breath: Secondary | ICD-10-CM | POA: Diagnosis not present

## 2012-01-18 DIAGNOSIS — I10 Essential (primary) hypertension: Secondary | ICD-10-CM | POA: Diagnosis not present

## 2012-01-18 DIAGNOSIS — Z79899 Other long term (current) drug therapy: Secondary | ICD-10-CM | POA: Diagnosis not present

## 2012-01-18 DIAGNOSIS — E119 Type 2 diabetes mellitus without complications: Secondary | ICD-10-CM | POA: Diagnosis not present

## 2012-01-18 DIAGNOSIS — Z7982 Long term (current) use of aspirin: Secondary | ICD-10-CM | POA: Diagnosis not present

## 2012-01-18 DIAGNOSIS — R109 Unspecified abdominal pain: Secondary | ICD-10-CM | POA: Diagnosis not present

## 2012-01-18 DIAGNOSIS — K6289 Other specified diseases of anus and rectum: Secondary | ICD-10-CM | POA: Diagnosis not present

## 2012-01-18 DIAGNOSIS — K921 Melena: Secondary | ICD-10-CM | POA: Diagnosis not present

## 2012-01-22 DIAGNOSIS — J329 Chronic sinusitis, unspecified: Secondary | ICD-10-CM | POA: Diagnosis not present

## 2012-01-22 DIAGNOSIS — Z8249 Family history of ischemic heart disease and other diseases of the circulatory system: Secondary | ICD-10-CM | POA: Diagnosis not present

## 2012-01-22 DIAGNOSIS — E785 Hyperlipidemia, unspecified: Secondary | ICD-10-CM | POA: Diagnosis not present

## 2012-01-22 DIAGNOSIS — Z7982 Long term (current) use of aspirin: Secondary | ICD-10-CM | POA: Diagnosis not present

## 2012-01-22 DIAGNOSIS — E119 Type 2 diabetes mellitus without complications: Secondary | ICD-10-CM | POA: Diagnosis not present

## 2012-01-22 DIAGNOSIS — D869 Sarcoidosis, unspecified: Secondary | ICD-10-CM | POA: Diagnosis not present

## 2012-01-22 DIAGNOSIS — R062 Wheezing: Secondary | ICD-10-CM | POA: Diagnosis not present

## 2012-01-22 DIAGNOSIS — I209 Angina pectoris, unspecified: Secondary | ICD-10-CM | POA: Diagnosis not present

## 2012-01-22 DIAGNOSIS — J984 Other disorders of lung: Secondary | ICD-10-CM | POA: Diagnosis not present

## 2012-01-22 DIAGNOSIS — Z2821 Immunization not carried out because of patient refusal: Secondary | ICD-10-CM | POA: Diagnosis not present

## 2012-01-22 DIAGNOSIS — R0602 Shortness of breath: Secondary | ICD-10-CM | POA: Diagnosis not present

## 2012-01-22 DIAGNOSIS — Z800 Family history of malignant neoplasm of digestive organs: Secondary | ICD-10-CM | POA: Diagnosis not present

## 2012-01-22 DIAGNOSIS — K449 Diaphragmatic hernia without obstruction or gangrene: Secondary | ICD-10-CM | POA: Diagnosis not present

## 2012-01-22 DIAGNOSIS — Z91013 Allergy to seafood: Secondary | ICD-10-CM | POA: Diagnosis not present

## 2012-01-22 DIAGNOSIS — I10 Essential (primary) hypertension: Secondary | ICD-10-CM | POA: Diagnosis not present

## 2012-01-22 DIAGNOSIS — Z79899 Other long term (current) drug therapy: Secondary | ICD-10-CM | POA: Diagnosis not present

## 2012-01-23 DIAGNOSIS — D869 Sarcoidosis, unspecified: Secondary | ICD-10-CM | POA: Diagnosis not present

## 2012-01-23 DIAGNOSIS — I10 Essential (primary) hypertension: Secondary | ICD-10-CM | POA: Diagnosis not present

## 2012-01-23 DIAGNOSIS — I209 Angina pectoris, unspecified: Secondary | ICD-10-CM | POA: Diagnosis not present

## 2012-01-23 DIAGNOSIS — K449 Diaphragmatic hernia without obstruction or gangrene: Secondary | ICD-10-CM | POA: Diagnosis not present

## 2012-01-23 DIAGNOSIS — J329 Chronic sinusitis, unspecified: Secondary | ICD-10-CM | POA: Diagnosis not present

## 2012-01-23 DIAGNOSIS — R0602 Shortness of breath: Secondary | ICD-10-CM | POA: Diagnosis not present

## 2012-01-23 DIAGNOSIS — E119 Type 2 diabetes mellitus without complications: Secondary | ICD-10-CM | POA: Diagnosis not present

## 2012-02-07 DIAGNOSIS — I209 Angina pectoris, unspecified: Secondary | ICD-10-CM | POA: Diagnosis not present

## 2012-02-07 DIAGNOSIS — E785 Hyperlipidemia, unspecified: Secondary | ICD-10-CM | POA: Diagnosis not present

## 2012-02-07 DIAGNOSIS — IMO0001 Reserved for inherently not codable concepts without codable children: Secondary | ICD-10-CM | POA: Diagnosis not present

## 2012-02-18 DIAGNOSIS — H04129 Dry eye syndrome of unspecified lacrimal gland: Secondary | ICD-10-CM | POA: Diagnosis not present

## 2012-02-18 DIAGNOSIS — H02209 Unspecified lagophthalmos unspecified eye, unspecified eyelid: Secondary | ICD-10-CM | POA: Diagnosis not present

## 2012-03-20 DIAGNOSIS — F411 Generalized anxiety disorder: Secondary | ICD-10-CM | POA: Diagnosis not present

## 2012-04-06 DIAGNOSIS — R112 Nausea with vomiting, unspecified: Secondary | ICD-10-CM | POA: Diagnosis not present

## 2012-04-06 DIAGNOSIS — Z5320 Procedure and treatment not carried out because of patient's decision for unspecified reasons: Secondary | ICD-10-CM | POA: Diagnosis not present

## 2012-04-06 DIAGNOSIS — R109 Unspecified abdominal pain: Secondary | ICD-10-CM | POA: Diagnosis not present

## 2012-04-07 DIAGNOSIS — S8010XA Contusion of unspecified lower leg, initial encounter: Secondary | ICD-10-CM | POA: Diagnosis not present

## 2012-04-18 DIAGNOSIS — H02209 Unspecified lagophthalmos unspecified eye, unspecified eyelid: Secondary | ICD-10-CM | POA: Diagnosis not present

## 2012-04-18 DIAGNOSIS — H04129 Dry eye syndrome of unspecified lacrimal gland: Secondary | ICD-10-CM | POA: Diagnosis not present

## 2012-04-18 DIAGNOSIS — H02409 Unspecified ptosis of unspecified eyelid: Secondary | ICD-10-CM | POA: Diagnosis not present

## 2012-04-30 DIAGNOSIS — M4800 Spinal stenosis, site unspecified: Secondary | ICD-10-CM | POA: Diagnosis not present

## 2012-04-30 DIAGNOSIS — R109 Unspecified abdominal pain: Secondary | ICD-10-CM | POA: Diagnosis not present

## 2012-04-30 DIAGNOSIS — R1032 Left lower quadrant pain: Secondary | ICD-10-CM | POA: Diagnosis not present

## 2012-04-30 DIAGNOSIS — N390 Urinary tract infection, site not specified: Secondary | ICD-10-CM | POA: Diagnosis not present

## 2012-05-20 DIAGNOSIS — N760 Acute vaginitis: Secondary | ICD-10-CM | POA: Diagnosis not present

## 2012-05-20 DIAGNOSIS — R319 Hematuria, unspecified: Secondary | ICD-10-CM | POA: Diagnosis not present

## 2012-05-20 DIAGNOSIS — N390 Urinary tract infection, site not specified: Secondary | ICD-10-CM | POA: Diagnosis not present

## 2012-05-20 DIAGNOSIS — N949 Unspecified condition associated with female genital organs and menstrual cycle: Secondary | ICD-10-CM | POA: Diagnosis not present

## 2012-06-03 DIAGNOSIS — N949 Unspecified condition associated with female genital organs and menstrual cycle: Secondary | ICD-10-CM | POA: Diagnosis not present

## 2012-06-03 DIAGNOSIS — D259 Leiomyoma of uterus, unspecified: Secondary | ICD-10-CM | POA: Diagnosis not present

## 2012-06-09 DIAGNOSIS — H04129 Dry eye syndrome of unspecified lacrimal gland: Secondary | ICD-10-CM | POA: Diagnosis not present

## 2012-06-09 DIAGNOSIS — H02209 Unspecified lagophthalmos unspecified eye, unspecified eyelid: Secondary | ICD-10-CM | POA: Diagnosis not present

## 2012-07-07 DIAGNOSIS — IMO0001 Reserved for inherently not codable concepts without codable children: Secondary | ICD-10-CM | POA: Diagnosis not present

## 2012-07-07 DIAGNOSIS — E782 Mixed hyperlipidemia: Secondary | ICD-10-CM | POA: Diagnosis not present

## 2012-07-07 DIAGNOSIS — G932 Benign intracranial hypertension: Secondary | ICD-10-CM | POA: Diagnosis not present

## 2012-07-07 DIAGNOSIS — M19019 Primary osteoarthritis, unspecified shoulder: Secondary | ICD-10-CM | POA: Diagnosis not present

## 2012-07-11 DIAGNOSIS — D869 Sarcoidosis, unspecified: Secondary | ICD-10-CM | POA: Diagnosis not present

## 2012-07-11 DIAGNOSIS — R079 Chest pain, unspecified: Secondary | ICD-10-CM | POA: Diagnosis not present

## 2012-07-16 DIAGNOSIS — D869 Sarcoidosis, unspecified: Secondary | ICD-10-CM | POA: Diagnosis not present

## 2012-08-11 DIAGNOSIS — H02209 Unspecified lagophthalmos unspecified eye, unspecified eyelid: Secondary | ICD-10-CM | POA: Diagnosis not present

## 2012-08-11 DIAGNOSIS — H04129 Dry eye syndrome of unspecified lacrimal gland: Secondary | ICD-10-CM | POA: Diagnosis not present

## 2012-08-11 DIAGNOSIS — D869 Sarcoidosis, unspecified: Secondary | ICD-10-CM | POA: Diagnosis not present

## 2012-08-22 DIAGNOSIS — H04039 Chronic enlargement of unspecified lacrimal gland: Secondary | ICD-10-CM | POA: Diagnosis not present

## 2012-08-22 DIAGNOSIS — D869 Sarcoidosis, unspecified: Secondary | ICD-10-CM | POA: Diagnosis not present

## 2012-09-04 DIAGNOSIS — H04039 Chronic enlargement of unspecified lacrimal gland: Secondary | ICD-10-CM | POA: Diagnosis not present

## 2012-09-05 DIAGNOSIS — M549 Dorsalgia, unspecified: Secondary | ICD-10-CM | POA: Diagnosis not present

## 2012-09-05 DIAGNOSIS — I10 Essential (primary) hypertension: Secondary | ICD-10-CM | POA: Diagnosis not present

## 2012-09-24 ENCOUNTER — Other Ambulatory Visit: Payer: Self-pay | Admitting: Adult Health

## 2012-10-06 DIAGNOSIS — H02209 Unspecified lagophthalmos unspecified eye, unspecified eyelid: Secondary | ICD-10-CM | POA: Diagnosis not present

## 2012-10-06 DIAGNOSIS — H04129 Dry eye syndrome of unspecified lacrimal gland: Secondary | ICD-10-CM | POA: Diagnosis not present

## 2012-10-06 DIAGNOSIS — D869 Sarcoidosis, unspecified: Secondary | ICD-10-CM | POA: Diagnosis not present

## 2012-10-26 ENCOUNTER — Other Ambulatory Visit: Payer: Self-pay | Admitting: Adult Health

## 2012-10-28 DIAGNOSIS — R69 Illness, unspecified: Secondary | ICD-10-CM | POA: Diagnosis not present

## 2012-11-04 ENCOUNTER — Telehealth: Payer: Self-pay | Admitting: Adult Health

## 2012-11-04 NOTE — Telephone Encounter (Signed)
Pt was supposed to be taking micronor and did not but she said she would

## 2012-11-10 ENCOUNTER — Other Ambulatory Visit: Payer: Self-pay | Admitting: Adult Health

## 2012-11-10 DIAGNOSIS — Z139 Encounter for screening, unspecified: Secondary | ICD-10-CM

## 2012-11-26 ENCOUNTER — Ambulatory Visit (INDEPENDENT_AMBULATORY_CARE_PROVIDER_SITE_OTHER): Payer: Medicare Other | Admitting: Adult Health

## 2012-11-26 ENCOUNTER — Encounter: Payer: Self-pay | Admitting: Adult Health

## 2012-11-26 VITALS — BP 120/78 | Ht 66.0 in | Wt 184.0 lb

## 2012-11-26 DIAGNOSIS — N939 Abnormal uterine and vaginal bleeding, unspecified: Secondary | ICD-10-CM | POA: Insufficient documentation

## 2012-11-26 DIAGNOSIS — A499 Bacterial infection, unspecified: Secondary | ICD-10-CM

## 2012-11-26 DIAGNOSIS — N926 Irregular menstruation, unspecified: Secondary | ICD-10-CM | POA: Diagnosis not present

## 2012-11-26 DIAGNOSIS — N760 Acute vaginitis: Secondary | ICD-10-CM

## 2012-11-26 DIAGNOSIS — Z3202 Encounter for pregnancy test, result negative: Secondary | ICD-10-CM | POA: Diagnosis not present

## 2012-11-26 DIAGNOSIS — B9689 Other specified bacterial agents as the cause of diseases classified elsewhere: Secondary | ICD-10-CM

## 2012-11-26 HISTORY — DX: Other specified bacterial agents as the cause of diseases classified elsewhere: N76.0

## 2012-11-26 HISTORY — DX: Other specified bacterial agents as the cause of diseases classified elsewhere: B96.89

## 2012-11-26 HISTORY — DX: Abnormal uterine and vaginal bleeding, unspecified: N93.9

## 2012-11-26 LAB — POCT WET PREP (WET MOUNT): WBC, Wet Prep HPF POC: POSITIVE

## 2012-11-26 LAB — POCT URINE PREGNANCY: Preg Test, Ur: NEGATIVE

## 2012-11-26 MED ORDER — METRONIDAZOLE 500 MG PO TABS: 500.0000 mg | ORAL_TABLET | Freq: Two times a day (BID) | ORAL | Status: DC

## 2012-11-26 NOTE — Progress Notes (Signed)
Subjective:     Patient ID: [name], female   DOB: [birth year], 47 y.o.   MRN: [id number]  HPI [name] is a 47 year old black female in complaining of spotting/bleeding on and off x 3 weeks, some cramps and itching.She is on mirconor for bleeding,she is sp BTL and ablation.  Review of Systems Positives in HPI Reviewed past medical,surgical, social and family history. Reviewed medications and allergies.     Objective:   Physical Exam BP 120/78  Ht 5\' 6"  (1.676 m)  Wt 184 lb (83.462 kg)  BMI 29.71 kg/m2  LMP 08/08/2014Urine pregnancy test is negative. Skin warm and dry.Pelvic: external genitalia is normal in appearance, vagina: tan to brown discharge with odor, cervix:smooth and bulbous, uterus: normal size, shape and contour, non tender, no masses felt, adnexa: no masses or tenderness noted. Wet prep: + for clue cells and +WBCs. GC/CHL obtained.     Assessment:     AUB BV    Plan:    Check GC/CHL Stop micronor after this pack Rx flagyl 500 mg 1 bid x 7 days, no alcohol no sex Follow up in 4 weeks, if persists will get [place] Review handout on BV

## 2012-11-26 NOTE — Patient Instructions (Addendum)
Bacterial Vaginosis Bacterial vaginosis (BV) is a vaginal infection where the normal balance of bacteria in the vagina is disrupted. The normal balance is then replaced by an overgrowth of certain bacteria. There are several different kinds of bacteria that can cause BV. BV is the most common vaginal infection in women of childbearing age. CAUSES   The cause of BV is not fully understood. BV develops when there is an increase or imbalance of harmful bacteria.  Some activities or behaviors can upset the normal balance of bacteria in the vagina and put women at increased risk including:  Having a new sex partner or multiple sex partners.  Douching.  Using an intrauterine device (IUD) for contraception.  It is not clear what role sexual activity plays in the development of BV. However, women that have never had sexual intercourse are rarely infected with BV. Women do not get BV from toilet seats, bedding, swimming pools or from touching objects around them.  SYMPTOMS   Grey vaginal discharge.  A fish-like odor with discharge, especially after sexual intercourse.  Itching or burning of the vagina and vulva.  Burning or pain with urination.  Some women have no signs or symptoms at all. DIAGNOSIS  Your caregiver must examine the vagina for signs of BV. Your caregiver will perform lab tests and look at the sample of vaginal fluid through a microscope. They will look for bacteria and abnormal cells (clue cells), a pH test higher than 4.5, and a positive amine test all associated with BV.  RISKS AND COMPLICATIONS   Pelvic inflammatory disease (PID).  Infections following gynecology surgery.  Developing HIV.  Developing herpes virus. TREATMENT  Sometimes BV will clear up without treatment. However, all women with symptoms of BV should be treated to avoid complications, especially if gynecology surgery is planned. Female partners generally do not need to be treated. However, BV may spread  between female sex partners so treatment is helpful in preventing a recurrence of BV.   BV may be treated with antibiotics. The antibiotics come in either pill or vaginal cream forms. Either can be used with nonpregnant or pregnant women, but the recommended dosages differ. These antibiotics are not harmful to the baby.  BV can recur after treatment. If this happens, a second round of antibiotics will often be prescribed.  Treatment is important for pregnant women. If not treated, BV can cause a premature delivery, especially for a pregnant woman who had a premature birth in the past. All pregnant women who have symptoms of BV should be checked and treated.  For chronic reoccurrence of BV, treatment with a type of prescribed gel vaginally twice a week is helpful. HOME CARE INSTRUCTIONS   Finish all medication as directed by your caregiver.  Do not have sex until treatment is completed.  Tell your sexual partner that you have a vaginal infection. They should see their caregiver and be treated if they have problems, such as a mild rash or itching.  Practice safe sex. Use condoms. Only have 1 sex partner. PREVENTION  Basic prevention steps can help reduce the risk of upsetting the natural balance of bacteria in the vagina and developing BV:  Do not have sexual intercourse (be abstinent).  Do not douche.  Use all of the medicine prescribed for treatment of BV, even if the signs and symptoms go away.  Tell your sex partner if you have BV. That way, they can be treated, if needed, to prevent reoccurrence. SEEK MEDICAL CARE IF:     Your symptoms are not improving after 3 days of treatment.  You have increased discharge, pain, or fever. MAKE SURE YOU:   Understand these instructions.  Will watch your condition.  Will get help right away if you are not doing well or get worse. FOR MORE INFORMATION  Division of STD Prevention (DSTDP), Centers for Disease Control and Prevention:  [employer] American Social Health Association (ASHA): www.ashastd.org  Document Released: 03/19/2005 Document Revised: 06/11/2011 Document Reviewed: 09/09/2008 [facility] Patient Information 2014 [place], [place]. No sex no alcohol take flagyl Follow up in 4 weeks Stop mironor after this pack

## 2012-11-27 LAB — GC/CHLAMYDIA PROBE AMP: CT Probe RNA: NEGATIVE

## 2012-12-02 ENCOUNTER — Telehealth: Payer: Self-pay | Admitting: Adult Health

## 2012-12-02 NOTE — Telephone Encounter (Signed)
Just finish the micro nor and see if bleeding returns or not.

## 2012-12-02 NOTE — Telephone Encounter (Signed)
Pt states saw Cyril Mourning, NP thought she was going to start pt on a different BCP after finishing current pack of Micronor but pt not sure.

## 2012-12-05 DIAGNOSIS — IMO0001 Reserved for inherently not codable concepts without codable children: Secondary | ICD-10-CM | POA: Diagnosis not present

## 2012-12-05 DIAGNOSIS — I209 Angina pectoris, unspecified: Secondary | ICD-10-CM | POA: Diagnosis not present

## 2012-12-05 DIAGNOSIS — E782 Mixed hyperlipidemia: Secondary | ICD-10-CM | POA: Diagnosis not present

## 2012-12-08 ENCOUNTER — Ambulatory Visit (HOSPITAL_COMMUNITY)
Admission: RE | Admit: 2012-12-08 | Discharge: 2012-12-08 | Disposition: A | Discharge status: Home / Self Care | Payer: Medicare Other | Source: Ambulatory Visit | Attending: Adult Health | Admitting: Adult Health

## 2012-12-08 DIAGNOSIS — Z1231 Encounter for screening mammogram for malignant neoplasm of breast: Secondary | ICD-10-CM | POA: Diagnosis not present

## 2012-12-08 DIAGNOSIS — Z139 Encounter for screening, unspecified: Secondary | ICD-10-CM

## 2012-12-10 DIAGNOSIS — R209 Unspecified disturbances of skin sensation: Secondary | ICD-10-CM | POA: Diagnosis not present

## 2012-12-10 DIAGNOSIS — R079 Chest pain, unspecified: Secondary | ICD-10-CM | POA: Diagnosis not present

## 2012-12-24 ENCOUNTER — Ambulatory Visit (INDEPENDENT_AMBULATORY_CARE_PROVIDER_SITE_OTHER): Payer: Medicare Other | Admitting: Adult Health

## 2012-12-24 ENCOUNTER — Encounter: Payer: Self-pay | Admitting: Adult Health

## 2012-12-24 VITALS — BP 128/80 | Ht 66.0 in | Wt 182.0 lb

## 2012-12-24 DIAGNOSIS — N949 Unspecified condition associated with female genital organs and menstrual cycle: Secondary | ICD-10-CM | POA: Diagnosis not present

## 2012-12-24 DIAGNOSIS — N939 Abnormal uterine and vaginal bleeding, unspecified: Secondary | ICD-10-CM

## 2012-12-24 DIAGNOSIS — R5383 Other fatigue: Secondary | ICD-10-CM | POA: Diagnosis not present

## 2012-12-24 DIAGNOSIS — N926 Irregular menstruation, unspecified: Secondary | ICD-10-CM | POA: Diagnosis not present

## 2012-12-24 DIAGNOSIS — R5381 Other malaise: Secondary | ICD-10-CM | POA: Diagnosis not present

## 2012-12-24 HISTORY — DX: Other fatigue: R53.83

## 2012-12-24 LAB — COMPREHENSIVE METABOLIC PANEL
ALT: 32 U/L (ref 0–35)
AST: 30 U/L (ref 0–37)
Alkaline Phosphatase: 45 U/L (ref 39–117)
BUN: 12 mg/dL (ref 6–23)
Creat: 0.71 mg/dL (ref 0.50–1.10)

## 2012-12-24 LAB — CBC
HCT: 35.2 % — ABNORMAL LOW (ref 36.0–46.0)
MCHC: 34.4 g/dL (ref 30.0–36.0)
MCV: 85.6 fL (ref 78.0–100.0)
Platelets: 301 10*3/uL (ref 150–400)
RDW: 12.8 % (ref 11.5–15.5)

## 2012-12-24 NOTE — Progress Notes (Signed)
Subjective:     Patient ID: Zyion Leidner, female   DOB: 1965/09/15, 47 y.o.   MRN: [id number]  HPI Kaleigh is back complaining of bleeding again and low pelvic pain. She has fatigue and still has some itching.She has history of constipation and said she takes citrate of mag but it is making her feel sick now.The bleeding did stop after stopping micronor and now it is back and is heavy at times.And she is having hot flashes.  Review of Systems See HPI Reviewed past medical,surgical, social and family history. Reviewed medications and allergies.     Objective:   Physical Exam BP 128/80  Ht 5\' 6"  (1.676 m)  Wt 182 lb (82.555 kg)  BMI 29.39 kg/m2  LMP 12/22/2012   Skin warm and dry.Pelvic: external genitalia is normal in appearance, vagina: has dark period like blood with out odor, cervix:smooth and bulbous, uterus: normal size, shape and contour,  tender, no masses felt, adnexa: no masses, tender LLQ tenderness noted. Discussed if [place] normal may try megace.   Assessment:     AUB sp ablation Fatigue  Pelvic pain History constipation    Plan:     Check CBC,CMP,TSH and FSH Return in 8 days for [place] and see me Try 1/2 cup applesauce,1/2 cup real oatmeal and 1/2 cup prune juice

## 2012-12-24 NOTE — Patient Instructions (Addendum)
Pelvic Pain Pelvic pain is pain below the belly button and located between your hips. Acute pain may last a few hours or days. Chronic pelvic pain may last weeks and months. The cause may be different for different types of pain. The pain may be dull or sharp, mild or severe and can interfere with your daily activities. Write down and tell your caregiver:   Exactly where the pain is located.  If it comes and goes or is there all the time.  When it happens (with sex, urination, bowel movement, etc.)  If the pain is related to your menstrual period or stress. Your caregiver will take a full history and do a complete physical exam and Pap test. CAUSES   Painful menstrual periods (dysmenorrhea).  Normal ovulation (Mittelschmertz) that occurs in the middle of the menstrual cycle every month.  The pelvic organs get engorged with blood just before the menstrual period (pelvic congestive syndrome).  Scar tissue from an infection or past surgery (pelvic adhesions).  Cancer of the female pelvic organs. When there is pain with cancer, it has been there for a long time.  The lining of the uterus (endometrium) abnormally grows in places like the pelvis and on the pelvic organs (endometriosis).  A form of endometriosis with the lining of the uterus present inside of the muscle tissue of the uterus (adenomyosis).  Fibroid tumor (noncancerous) in the uterus.  Bladder problems such as infection, bladder spasms of the muscle tissue of the bladder.  Intestinal problems (irritable bowel syndrome, colitis, an ulcer or gastrointestinal infection).  Polyps of the cervix or uterus.  Pregnancy in the tube (ectopic pregnancy).  The opening of the cervix is too small for the menstrual blood to flow through it (cervical stenosis).  Physical or sexual abuse (past or present).  Musculo-skeletal problems from poor posture, problems with the vertebrae of the lower back or the uterine pelvic muscles falling  (prolapse).  Psychological problems such as depression or stress.  IUD (intrauterine device) in the uterus. DIAGNOSIS  Tests to make a diagnosis depends on the type, location, severity and what causes the pain to occur. Tests that may be needed include:  Blood tests.  Urine tests  Ultrasound.  X-rays.  CT Scan.  MRI.  Laparoscopy.  Major surgery. TREATMENT  Treatment will depend on the cause of the pain, which includes:  Prescription or over-the-counter pain medication.  Antibiotics.  Birth control pills.  Hormone treatment.  Nerve blocking injections.  Physical therapy.  Antidepressants.  Counseling with a psychiatrist or psychologist.  Minor or major surgery. HOME CARE INSTRUCTIONS   Only take over-the-counter or prescription medicines for pain, discomfort or fever as directed by your caregiver.  Follow your caregiver's advice to treat your pain.  Rest.  Avoid sexual intercourse if it causes the pain.  Apply warm or cold compresses (which ever works best) to the pain area.  Do relaxation exercises such as yoga or meditation.  Try acupuncture.  Avoid stressful situations.  Try group therapy.  If the pain is because of a stomach/intestinal upset, drink clear liquids, eat a bland light food diet until the symptoms go away. SEEK MEDICAL CARE IF:   You need stronger prescription pain medication.  You develop pain with sexual intercourse.  You have pain with urination.  You develop a temperature of 102 F (38.9 C) with the pain.  You are still in pain after 4 hours of taking prescription medication for the pain.  You need depression medication.    Your IUD is causing pain and you want it removed. SEEK IMMEDIATE MEDICAL CARE IF:  You develop very severe pain or tenderness.  You faint, have chills, severe weakness or dehydration.  You develop heavy vaginal bleeding or passing solid tissue.  You develop a temperature of 102 F (38.9 C)  with the pain.  You have blood in the urine.  You are being physically or sexually abused.  You have uncontrolled vomiting and diarrhea.  You are depressed and afraid of harming yourself or someone else. Document Released: 04/26/2004 Document Revised: 06/11/2011 Document Reviewed: 01/22/2008 [facility] Patient Information 2013 [place], [place]. Follow up in 8 days for [place]  Bring meds to visit

## 2012-12-25 ENCOUNTER — Telehealth: Payer: Self-pay | Admitting: Adult Health

## 2012-12-25 NOTE — Telephone Encounter (Signed)
Pt aware of labs  

## 2012-12-26 DIAGNOSIS — D869 Sarcoidosis, unspecified: Secondary | ICD-10-CM | POA: Diagnosis not present

## 2013-01-01 ENCOUNTER — Encounter: Payer: Self-pay | Admitting: Gastroenterology

## 2013-01-01 ENCOUNTER — Ambulatory Visit (INDEPENDENT_AMBULATORY_CARE_PROVIDER_SITE_OTHER): Payer: Medicare Other | Admitting: Adult Health

## 2013-01-01 ENCOUNTER — Encounter: Payer: Self-pay | Admitting: Adult Health

## 2013-01-01 ENCOUNTER — Ambulatory Visit (INDEPENDENT_AMBULATORY_CARE_PROVIDER_SITE_OTHER): Payer: Medicare Other

## 2013-01-01 VITALS — BP 120/82 | Ht 66.0 in | Wt 182.0 lb

## 2013-01-01 DIAGNOSIS — N949 Unspecified condition associated with female genital organs and menstrual cycle: Secondary | ICD-10-CM

## 2013-01-01 DIAGNOSIS — K649 Unspecified hemorrhoids: Secondary | ICD-10-CM

## 2013-01-01 DIAGNOSIS — N939 Abnormal uterine and vaginal bleeding, unspecified: Secondary | ICD-10-CM

## 2013-01-01 DIAGNOSIS — N926 Irregular menstruation, unspecified: Secondary | ICD-10-CM

## 2013-01-01 DIAGNOSIS — Z1212 Encounter for screening for malignant neoplasm of rectum: Secondary | ICD-10-CM

## 2013-01-01 DIAGNOSIS — D259 Leiomyoma of uterus, unspecified: Secondary | ICD-10-CM | POA: Diagnosis not present

## 2013-01-01 DIAGNOSIS — D219 Benign neoplasm of connective and other soft tissue, unspecified: Secondary | ICD-10-CM

## 2013-01-01 DIAGNOSIS — N83209 Unspecified ovarian cyst, unspecified side: Secondary | ICD-10-CM | POA: Diagnosis not present

## 2013-01-01 HISTORY — DX: Unspecified hemorrhoids: K64.9

## 2013-01-01 HISTORY — DX: Unspecified ovarian cyst, unspecified side: N83.209

## 2013-01-01 HISTORY — DX: Benign neoplasm of connective and other soft tissue, unspecified: D21.9

## 2013-01-01 LAB — HEMOCCULT GUIAC POC 1CARD (OFFICE): Fecal Occult Blood, POC: NEGATIVE

## 2013-01-01 NOTE — Progress Notes (Signed)
Subjective:     Patient ID: [name], female   DOB: [birth year], 46 y.o.   MRN: [id number]  HPI [name] is a 47 year old black female in for [place] for AUB.  She is complaining of a? Hemorrhoid.  Review of Systems See HPI Reviewed past medical,surgical, social and family history. Reviewed medications and allergies.      Objective:   Physical Exam BP 120/82  Ht 5\' 6"  (1.676 m)  Wt 182 lb (82.555 kg)  BMI 29.39 kg/m2  LMP 12/22/2012   On rectal exam has skin tag and internal hemorrhoid,hemoccult was negative, but she has seen blood in past and has had colonoscopy before.Reviewed [place] with pt has multiple fibroids and complex right ovarian cyst.  Assessment:     Right complex ovarian cyst Fibroids Hemorrhoids History AUB    Plan:      Follow up in 6 weeks for [place] Refer to Dr Jena Gauss for hemorrhoid banding Use preparation H Review handouts on fibroids,hemorrhoids and ovarian cyst Call with any abnormal bleeding

## 2013-01-01 NOTE — Patient Instructions (Addendum)
Fibroids Fibroids are lumps (tumors) that can occur any place in a woman's body. These lumps are not cancerous. Fibroids vary in size, weight, and where they grow. HOME CARE Do not take aspirin. Write down the number of pads or tampons you use during your period. Tell your doctor. This can help determine the best treatment for you. GET HELP RIGHT AWAY IF: You have pain in your lower belly (abdomen) that is not helped with medicine. You have cramps that are not helped with medicine. You have more bleeding between or during your period. You feel lightheaded or pass out (faint). Your lower belly pain gets worse. MAKE SURE YOU: Understand these instructions. Will watch your condition. Will get help right away if you are not doing well or get worse. Document Released: 04/21/2010 Document Revised: 06/11/2011 Document Reviewed: 04/21/2010 [facility] Patient Information 2014 [place], [place]. Hemorrhoids Hemorrhoids are swollen veins around the rectum or anus. There are two types of hemorrhoids:  Internal hemorrhoids. These occur in the veins just inside the rectum. They may poke through to the outside and become irritated and painful. External hemorrhoids. These occur in the veins outside the anus and can be felt as a painful swelling or hard lump near the anus. CAUSES Pregnancy.  Obesity.  Constipation or diarrhea.  Straining to have a bowel movement.  Sitting for long periods on the toilet. Heavy lifting or other activity that caused you to strain. Anal intercourse. SYMPTOMS  Pain.  Anal itching or irritation.  Rectal bleeding.  Fecal leakage.  Anal swelling.  One or more lumps around the anus.  DIAGNOSIS  Your caregiver may be able to diagnose hemorrhoids by visual examination. Other examinations or tests that may be performed include:  Examination of the rectal area with a gloved hand (digital rectal exam).  Examination of anal canal using a small tube (scope).  A blood  test if you have lost a significant amount of blood. A test to look inside the colon (sigmoidoscopy or colonoscopy). TREATMENT Most hemorrhoids can be treated at home. However, if symptoms do not seem to be getting better or if you have a lot of rectal bleeding, your caregiver may perform a procedure to help make the hemorrhoids get smaller or remove them completely. Possible treatments include:  Placing a rubber band at the base of the hemorrhoid to cut off the circulation (rubber band ligation).  Injecting a chemical to shrink the hemorrhoid (sclerotherapy).  Using a tool to burn the hemorrhoid (infrared light therapy).  Surgically removing the hemorrhoid (hemorrhoidectomy).  Stapling the hemorrhoid to block blood flow to the tissue (hemorrhoid stapling).  HOME CARE INSTRUCTIONS  Eat foods with fiber, such as whole grains, beans, nuts, fruits, and vegetables. Ask your doctor about taking products with added fiber in them (fibersupplements). Increase fluid intake. Drink enough water and fluids to keep your urine clear or pale yellow.  Exercise regularly.  Go to the bathroom when you have the urge to have a bowel movement. Do not wait.  Avoid straining to have bowel movements.  Keep the anal area dry and clean. Use wet toilet paper or moist towelettes after a bowel movement.  Medicated creams and suppositories may be used or applied as directed.  Only take over-the-counter or prescription medicines as directed by your caregiver.  Take warm sitz baths for 15 20 minutes, 3 4 times a day to ease pain and discomfort.  Place ice packs on the hemorrhoids if they are tender and swollen. Using ice packs between  sitz baths may be helpful.  Put ice in a plastic bag.  Place a towel between your skin and the bag.  Leave the ice on for 15 20 minutes, 3 4 times a day.  Do not use a donut-shaped pillow or sit on the toilet for long periods. This increases blood pooling and pain.  SEEK  MEDICAL CARE IF: You have increasing pain and swelling that is not controlled by treatment or medicine. You have uncontrolled bleeding. You have difficulty or you are unable to have a bowel movement. You have pain or inflammation outside the area of the hemorrhoids. MAKE SURE YOU: Understand these instructions. Will watch your condition. Will get help right away if you are not doing well or get worse. Document Released: 03/16/2000 Document Revised: 03/05/2012 Document Reviewed: 01/22/2012 [facility] Patient Information 2014 [place], [place]. Ovarian Cyst The ovaries are small organs that are on each side of the uterus. The ovaries are the organs that produce the female hormones, estrogen and progesterone. An ovarian cyst is a sac filled with fluid that can vary in its size. It is normal for a small cyst to form in women who are in the childbearing age and who have menstrual periods. This type of cyst is called a follicle cyst that becomes an ovulation cyst (corpus luteum cyst) after it produces the women's egg. It later goes away on its own if the woman does not become pregnant. There are other kinds of ovarian cysts that may cause problems and may need to be treated. The most serious problem is a cyst with cancer. It should be noted that menopausal women who have an ovarian cyst are at a higher risk of it being a cancer cyst. They should be evaluated very quickly, thoroughly and followed closely. This is especially true in menopausal women because of the high rate of ovarian cancer in women in menopause. CAUSES AND TYPES OF OVARIAN CYSTS:  FUNCTIONAL CYST: The follicle/corpus luteum cyst is a functional cyst that occurs every month during ovulation with the menstrual cycle. They go away with the next menstrual cycle if the woman does not get pregnant. Usually, there are no symptoms with a functional cyst.  ENDOMETRIOMA CYST: This cyst develops from the lining of the uterus tissue. This cyst gets in or  on the ovary. It grows every month from the bleeding during the menstrual period. It is also called a "chocolate cyst" because it becomes filled with blood that turns brown. This cyst can cause pain in the lower abdomen during intercourse and with your menstrual period.  CYSTADENOMA CYST: This cyst develops from the cells on the outside of the ovary. They usually are not cancerous. They can get very big and cause lower abdomen pain and pain with intercourse. This type of cyst can twist on itself, cut off its blood supply and cause severe pain. It also can easily rupture and cause a lot of pain.  DERMOID CYST: This type of cyst is sometimes found in both ovaries. They are found to have different kinds of body tissue in the cyst. The tissue includes skin, teeth, hair, and/or cartilage. They usually do not have symptoms unless they get very big. Dermoid cysts are rarely cancerous.  POLYCYSTIC OVARY: This is a rare condition with hormone problems that produces many small cysts on both ovaries. The cysts are follicle-like cysts that never produce an egg and become a corpus luteum. It can cause an increase in body weight, infertility, acne, increase in body and facial  hair and lack of menstrual periods or rare menstrual periods. Many women with this problem develop type 2 diabetes. The exact cause of this problem is unknown. A polycystic ovary is rarely cancerous.  THECA LUTEIN CYST: Occurs when too much hormone (human chorionic gonadotropin) is produced and over-stimulates the ovaries to produce an egg. They are frequently seen when doctors stimulate the ovaries for invitro-fertilization (test tube babies).  LUTEOMA CYST: This cyst is seen during pregnancy. Rarely it can cause an obstruction to the birth canal during labor and delivery. They usually go away after delivery. SYMPTOMS   Pelvic pain or pressure.  Pain during sexual intercourse.  Increasing girth (swelling) of the abdomen.  Abnormal  menstrual periods.  Increasing pain with menstrual periods.  You stop having menstrual periods and you are not pregnant. DIAGNOSIS  The diagnosis can be made during:  Routine or annual pelvic examination (common).  Ultrasound.  X-ray of the pelvis.  CT Scan.  MRI.  Blood tests. TREATMENT   Treatment may only be to follow the cyst monthly for 2 to 3 months with your caregiver. Many go away on their own, especially functional cysts.  May be aspirated (drained) with a long needle with ultrasound, or by laparoscopy (inserting a tube into the pelvis through a small incision).  The whole cyst can be removed by laparoscopy.  Sometimes the cyst may need to be removed through an incision in the lower abdomen.  Hormone treatment is sometimes used to help dissolve certain cysts.  Birth control pills are sometimes used to help dissolve certain cysts. HOME CARE INSTRUCTIONS  Follow your caregiver's advice regarding:  Medicine.  Follow up visits to evaluate and treat the cyst.  You may need to come back or make an appointment with another caregiver, to find the exact cause of your cyst, if your caregiver is not a gynecologist.  Get your yearly and recommended pelvic examinations and Pap tests.  Let your caregiver know if you have had an ovarian cyst in the past. SEEK MEDICAL CARE IF:   Your periods are late, irregular, they stop, or are painful.  Your stomach (abdomen) or pelvic pain does not go away.  Your stomach becomes larger or swollen.  You have pressure on your bladder or trouble emptying your bladder completely.  You have painful sexual intercourse.  You have feelings of fullness, pressure, or discomfort in your stomach.  You lose weight for no apparent reason.  You feel generally ill.  You become constipated.  You lose your appetite.  You develop acne.  You have an increase in body and facial hair.  You are gaining weight, without changing your  exercise and eating habits.  You think you are pregnant. SEEK IMMEDIATE MEDICAL CARE IF:   You have increasing abdominal pain.  You feel sick to your stomach (nausea) and/or vomit.  You develop a fever that comes on suddenly.  You develop abdominal pain during a bowel movement.  Your menstrual periods become heavier than usual. Document Released: 03/19/2005 Document Revised: 06/11/2011 Document Reviewed: 01/20/2009 [facility] Patient Information 2014 [place], [place]. Follow up in 6 weeks for [place] Refer to Dr Jena Gauss

## 2013-01-03 ENCOUNTER — Emergency Department (HOSPITAL_COMMUNITY): Payer: Medicare Other

## 2013-01-03 ENCOUNTER — Encounter (HOSPITAL_COMMUNITY): Payer: Self-pay

## 2013-01-03 ENCOUNTER — Emergency Department (HOSPITAL_COMMUNITY)
Admission: EM | Admit: 2013-01-03 | Discharge: 2013-01-03 | Disposition: A | Discharge status: Home / Self Care | Payer: Medicare Other | Attending: Emergency Medicine | Admitting: Emergency Medicine

## 2013-01-03 DIAGNOSIS — Z8742 Personal history of other diseases of the female genital tract: Secondary | ICD-10-CM | POA: Diagnosis not present

## 2013-01-03 DIAGNOSIS — Z7982 Long term (current) use of aspirin: Secondary | ICD-10-CM | POA: Insufficient documentation

## 2013-01-03 DIAGNOSIS — Z87448 Personal history of other diseases of urinary system: Secondary | ICD-10-CM | POA: Insufficient documentation

## 2013-01-03 DIAGNOSIS — Z79899 Other long term (current) drug therapy: Secondary | ICD-10-CM | POA: Insufficient documentation

## 2013-01-03 DIAGNOSIS — J45909 Unspecified asthma, uncomplicated: Secondary | ICD-10-CM | POA: Insufficient documentation

## 2013-01-03 DIAGNOSIS — R109 Unspecified abdominal pain: Secondary | ICD-10-CM | POA: Diagnosis not present

## 2013-01-03 DIAGNOSIS — R110 Nausea: Secondary | ICD-10-CM | POA: Insufficient documentation

## 2013-01-03 DIAGNOSIS — N83201 Unspecified ovarian cyst, right side: Secondary | ICD-10-CM

## 2013-01-03 DIAGNOSIS — Z87891 Personal history of nicotine dependence: Secondary | ICD-10-CM | POA: Diagnosis not present

## 2013-01-03 DIAGNOSIS — R1031 Right lower quadrant pain: Secondary | ICD-10-CM | POA: Diagnosis not present

## 2013-01-03 DIAGNOSIS — N83209 Unspecified ovarian cyst, unspecified side: Secondary | ICD-10-CM | POA: Insufficient documentation

## 2013-01-03 DIAGNOSIS — Z8619 Personal history of other infectious and parasitic diseases: Secondary | ICD-10-CM | POA: Diagnosis not present

## 2013-01-03 DIAGNOSIS — I10 Essential (primary) hypertension: Secondary | ICD-10-CM | POA: Insufficient documentation

## 2013-01-03 DIAGNOSIS — E119 Type 2 diabetes mellitus without complications: Secondary | ICD-10-CM | POA: Insufficient documentation

## 2013-01-03 DIAGNOSIS — Z791 Long term (current) use of non-steroidal anti-inflammatories (NSAID): Secondary | ICD-10-CM | POA: Insufficient documentation

## 2013-01-03 LAB — CBC WITH DIFFERENTIAL/PLATELET
Basophils Absolute: 0 10*3/uL (ref 0.0–0.1)
Basophils Relative: 1 % (ref 0–1)
Eosinophils Absolute: 0.1 10*3/uL (ref 0.0–0.7)
MCH: 29.8 pg (ref 26.0–34.0)
MCHC: 34.8 g/dL (ref 30.0–36.0)
Monocytes Relative: 8 % (ref 3–12)
Neutrophils Relative %: 55 % (ref 43–77)
Platelets: 255 10*3/uL (ref 150–400)
RDW: 11.9 % (ref 11.5–15.5)

## 2013-01-03 LAB — COMPREHENSIVE METABOLIC PANEL
ALT: 48 U/L — ABNORMAL HIGH (ref 0–35)
AST: 43 U/L — ABNORMAL HIGH (ref 0–37)
Calcium: 9.7 mg/dL (ref 8.4–10.5)
Creatinine, Ser: 0.81 mg/dL (ref 0.50–1.10)
GFR calc Af Amer: >90 mL/min (ref >90)
GFR calc non Af Amer: 84 mL/min — ABNORMAL LOW (ref >90)
Potassium: 3.2 mEq/L — ABNORMAL LOW (ref 3.5–5.1)
Sodium: 137 mEq/L (ref 135–145)
Total Bilirubin: 0.3 mg/dL (ref 0.3–1.2)

## 2013-01-03 MED ORDER — OXYCODONE-ACETAMINOPHEN 5-325 MG PO TABS: 2.0000 | ORAL_TABLET | ORAL | Status: DC | PRN

## 2013-01-03 MED ORDER — ONDANSETRON HCL 4 MG/2ML IJ SOLN
4.0000 mg | Freq: Once | INTRAMUSCULAR | Status: AC
  Administered 2013-01-03: 4 mg via INTRAVENOUS
  Filled 2013-01-03: qty 2

## 2013-01-03 MED ORDER — HYDROMORPHONE HCL PF 1 MG/ML IJ SOLN
1.0000 mg | Freq: Once | INTRAMUSCULAR | Status: AC
  Administered 2013-01-03: 1 mg via INTRAVENOUS
  Filled 2013-01-03: qty 1

## 2013-01-03 MED ORDER — PROMETHAZINE HCL 25 MG PO TABS: 25.0000 mg | ORAL_TABLET | Freq: Four times a day (QID) | ORAL | Status: DC | PRN

## 2013-01-03 MED ORDER — IOHEXOL 300 MG/ML  SOLN
100.0000 mL | Freq: Once | INTRAMUSCULAR | Status: AC | PRN
  Administered 2013-01-03: 100 mL via INTRAVENOUS

## 2013-01-03 MED ORDER — MORPHINE SULFATE 4 MG/ML IJ SOLN
4.0000 mg | Freq: Once | INTRAMUSCULAR | Status: AC
  Administered 2013-01-03: 4 mg via INTRAVENOUS
  Filled 2013-01-03: qty 1

## 2013-01-03 MED ORDER — IOHEXOL 300 MG/ML  SOLN
50.0000 mL | Freq: Once | INTRAMUSCULAR | Status: AC | PRN
  Administered 2013-01-03: 50 mL via INTRAVENOUS

## 2013-01-03 MED ORDER — SODIUM CHLORIDE 0.9 % IV BOLUS (SEPSIS)
500.0000 mL | Freq: Once | INTRAVENOUS | Status: AC
  Administered 2013-01-03: 500 mL via INTRAVENOUS

## 2013-01-03 NOTE — ED Notes (Signed)
Lower abdominal pain and pain around umbilical area, feels like it is pulling per pt.

## 2013-01-03 NOTE — ED Provider Notes (Signed)
CSN: [id number]     Arrival date & time 01/03/13  1904 History  This chart was scribed for Donnetta Hutching, MD by Ardelia Mems, ED Scribe. This patient was seen in room APA01/APA01 and the patient's care was started at 7:35 PM.   Chief Complaint  Patient presents with  . Abdominal Pain  . Nausea    The history is provided by the patient. No language interpreter was used.    HPI Comments: [name] is a 47 y.o. female with a history of abnormal uterine bleeding, fibroids, HTN and DM who presents to the Emergency Department complaining of constant, moderate lower abdominal pain over the past couple weeks which worsened yesterday. She describes her pain as soreness and as a "pulling" sensation. She was evaluated for this pain by her OB-GYN 2 days ago, and had an [place]. The US revealed multiple uterine fibroids and an avascular complex cystic structure in her right ovary. She states that she was told to follow-up on 11/13 to see if these symptoms had worsened or improved. She has a history of tubal ligation, endometrial ablation and cholecystectomy.  She denies vaginal bleeding, vaginal discharge or any other symtpoms.  PCP- Dr. Lilyan Punt   Past Medical History  Diagnosis Date  . Sarcoidosis   . Diabetes mellitus   . Hypertension   . Asthma   . Abnormal uterine bleeding (AUB) 11/26/2012  . BV (bacterial vaginosis) 11/26/2012  . Fatigue 12/24/2012  . Other and unspecified ovarian cyst 01/01/2013  . Fibroids 01/01/2013  . Hemorrhoid 01/01/2013   Past Surgical History  Procedure Laterality Date  . Tubal ligation    . Endometrial ablation    . Cholecystectomy     Family History  Problem Relation Age of Onset  . Diabetes Father   . Cancer Mother     throat  . Cancer Brother     lung   History  Substance Use Topics  . Smoking status: Former Games developer  . Smokeless tobacco: Never Used  . Alcohol Use: No   OB History   Grav Para Term Preterm Abortions TAB SAB Ect Mult Living   2 2         2      Review of Systems A complete 10 system review of systems was obtained and all systems are negative except as noted in the HPI and PMH.   Allergies  Shellfish allergy  Home Medications   Current Outpatient Rx  Name  Route  Sig  Dispense  Refill  . aspirin EC 81 MG tablet   Oral   Take 325 mg by mouth daily.          Marland Kitchen atorvastatin (LIPITOR) 20 MG tablet   Oral   Take 20 mg by mouth daily.         . benazepril (LOTENSIN) 10 MG tablet   Oral   Take 10 mg by mouth daily.          . diclofenac (VOLTAREN) 75 MG EC tablet   Oral   Take 75 mg by mouth 2 (two) times daily.         . fenofibrate (TRICOR) 145 MG tablet   Oral   Take 145 mg by mouth daily.          Marland Kitchen gabapentin (NEURONTIN) 300 MG capsule   Oral   Take 300 mg by mouth 3 (three) times daily.         . hydrOXYzine (VISTARIL) 50 MG capsule   Oral  Take 50 mg by mouth 3 (three) times daily.         . metFORMIN (GLUCOPHAGE) 1000 MG tablet   Oral   Take 1,000 mg by mouth 2 (two) times daily with a meal.          . norethindrone (MICRONOR,CAMILA,ERRIN) 0.35 MG tablet   Oral   Take 1 tablet by mouth daily.         Marland Kitchen PRESCRIPTION MEDICATION   Both Eyes   Place 4 drops into both eyes 4 (four) times daily. Eye drops compounded at [facility] from patient's blood         . ranitidine (ZANTAC) 150 MG tablet   Oral   Take 150 mg by mouth 2 (two) times daily.          Triage Vitals: BP 113/46  Pulse 98  Temp(Src) 98.6 F (37 C) (Oral)  Resp 20  Ht 5\' 6"  (1.676 m)  Wt 182 lb (82.555 kg)  BMI 29.39 kg/m2  SpO2 98%  LMP 12/22/2012  Physical Exam  Nursing note and vitals reviewed. Constitutional: She is oriented to person, place, and time. She appears well-developed and well-nourished.  HENT:  Head: Normocephalic and atraumatic.  Eyes: Conjunctivae and EOM are normal. Pupils are equal, round, and reactive to light.  Neck: Normal range of motion. Neck supple.   Cardiovascular: Normal rate, regular rhythm and normal heart sounds.   Pulmonary/Chest: Effort normal and breath sounds normal.  Abdominal: Soft. Bowel sounds are normal. There is tenderness.  Minimal lower abdominal tenderness.  Musculoskeletal: Normal range of motion.  Neurological: She is alert and oriented to person, place, and time.  Skin: Skin is warm and dry.  Psychiatric: She has a normal mood and affect.    ED Course  Procedures (including critical care time)  DIAGNOSTIC STUDIES: Oxygen Saturation is 98% on RA, normal by my interpretation.    COORDINATION OF CARE: 7:41 PM- Discussed plan for pt to have a CT, and advised pt of the length of time which this may take to obtain. Pt advised of plan for treatment and pt agrees.  Medications  sodium chloride 0.9 % bolus 500 mL (0 mLs Intravenous Stopped 01/03/13 2303)  morphine 4 MG/ML injection 4 mg (4 mg Intravenous Given 01/03/13 2003)  ondansetron (ZOFRAN) injection 4 mg (4 mg Intravenous Given 01/03/13 2003)  iohexol (OMNIPAQUE) 300 MG/ML solution 50 mL (50 mLs Intravenous Contrast Given 01/03/13 2130)  iohexol (OMNIPAQUE) 300 MG/ML solution 100 mL (100 mLs Intravenous Contrast Given 01/03/13 2131)  HYDROmorphone (DILAUDID) injection 1 mg (1 mg Intravenous Given 01/03/13 2121)  ondansetron (ZOFRAN) injection 4 mg (4 mg Intravenous Given 01/03/13 2120)   Labs Review Labs Reviewed  CBC WITH DIFFERENTIAL - Abnormal; Notable for the following:    HCT 35.1 (*)    All other components within normal limits  COMPREHENSIVE METABOLIC PANEL - Abnormal; Notable for the following:    Potassium 3.2 (*)    Glucose, Bld 112 (*)    AST 43 (*)    ALT 48 (*)    GFR calc non Af Amer 84 (*)    All other components within normal limits  LIPASE, BLOOD   Imaging Review Ct Abdomen Pelvis W Contrast  01/03/2013   CLINICAL DATA:  Right lower quadrant pain  EXAM: CT ABDOMEN AND PELVIS WITH CONTRAST  TECHNIQUE: Multidetector CT imaging of the  abdomen and pelvis was performed using the standard protocol following bolus administration of intravenous contrast.  CONTRAST:  50mL OMNIPAQUE IOHEXOL 300 MG/ML SOLN, OMNIPAQUE IOHEXOL 300 MG/ML SOLN  COMPARISON:  11/30/2010  FINDINGS: Scarring is noted in the right lung base stable from the prior exam. No acute infiltrate or effusion is seen.  The gallbladder is been surgically removed. The liver, spleen, adrenal glands and pancreas are within normal limits. The kidneys are well visualized bilaterally and demonstrate a normal enhancement pattern. Delayed images demonstrate normal contrast excretion bilaterally. No renal calculi are seen. No obstructive changes are noted. The appendix is within normal limits. 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 2.5 cm predominately hypodense lesion is noted within the right ovary consistent with ovarian cyst. No free pelvic fluid is noted. No gross bony abnormality is noted.  IMPRESSION: Hypodensity within the right ovary likely representing a cyst. This may contribute to the patient's discomfort. No other focal abnormality is noted.   Electronically Signed   By: Alcide Clever M.D.   On: 01/03/2013 21:58    MDM  No diagnosis found. No acute abdomen. Discussed right ovarian pathology with patient and her female friend.  She understands to get gynecological followup for this.  Discharge medications Percocet and Phenergan 25 mg   I personally performed the services described in this documentation, which was scribed in my presence. The recorded information has been reviewed and is accurate.     Donnetta Hutching, MD 01/04/13 [phone]

## 2013-01-05 DIAGNOSIS — H04129 Dry eye syndrome of unspecified lacrimal gland: Secondary | ICD-10-CM | POA: Diagnosis not present

## 2013-01-14 ENCOUNTER — Ambulatory Visit (INDEPENDENT_AMBULATORY_CARE_PROVIDER_SITE_OTHER): Payer: Medicare Other | Admitting: Gastroenterology

## 2013-01-14 ENCOUNTER — Encounter (INDEPENDENT_AMBULATORY_CARE_PROVIDER_SITE_OTHER): Payer: Self-pay

## 2013-01-14 ENCOUNTER — Encounter: Payer: Self-pay | Admitting: Gastroenterology

## 2013-01-14 VITALS — BP 127/77 | HR 99 | Temp 97.5°F | Ht 66.0 in | Wt 181.2 lb

## 2013-01-14 DIAGNOSIS — K649 Unspecified hemorrhoids: Secondary | ICD-10-CM | POA: Diagnosis not present

## 2013-01-14 DIAGNOSIS — K6289 Other specified diseases of anus and rectum: Secondary | ICD-10-CM | POA: Diagnosis not present

## 2013-01-14 DIAGNOSIS — R945 Abnormal results of liver function studies: Secondary | ICD-10-CM | POA: Insufficient documentation

## 2013-01-14 DIAGNOSIS — K219 Gastro-esophageal reflux disease without esophagitis: Secondary | ICD-10-CM | POA: Diagnosis not present

## 2013-01-14 DIAGNOSIS — R7989 Other specified abnormal findings of blood chemistry: Secondary | ICD-10-CM | POA: Diagnosis not present

## 2013-01-14 LAB — HEPATIC FUNCTION PANEL
ALT: 32 U/L (ref 0–35)
Albumin: 4.4 g/dL (ref 3.5–5.2)
Alkaline Phosphatase: 65 U/L (ref 39–117)
Indirect Bilirubin: 0.3 mg/dL (ref 0.0–0.9)
Total Protein: 7.5 g/dL (ref 6.0–8.3)

## 2013-01-14 MED ORDER — LUBIPROSTONE 24 MCG PO CAPS: 24.0000 ug | ORAL_CAPSULE | Freq: Two times a day (BID) | ORAL | Status: DC

## 2013-01-14 MED ORDER — OMEPRAZOLE 20 MG PO CPDR: 20.0000 mg | DELAYED_RELEASE_CAPSULE | Freq: Every day | ORAL | Status: DC

## 2013-01-14 MED ORDER — LIDOCAINE-HYDROCORTISONE ACE 3-0.5 % RE CREA: 1.0000 | TOPICAL_CREAM | Freq: Two times a day (BID) | RECTAL | Status: DC

## 2013-01-14 NOTE — Assessment & Plan Note (Signed)
?  secondary to Lipitor. Need to rule out viral hepatitis. Liver normal appearing on recent CT.

## 2013-01-14 NOTE — Assessment & Plan Note (Signed)
Heartburn controlled but complains of nocturnal regurgitation and early AM vomiting. Trial of omeprazole 20mg  daily. Stop Zantac.

## 2013-01-14 NOTE — Progress Notes (Signed)
cc'd to pcp 

## 2013-01-14 NOTE — Progress Notes (Signed)
Primary Care Physician:  Toma Deiters, MD  Primary Gastroenterologist:  Jonette Eva, MD   Chief Complaint  Patient presents with  . Hemorrhoids    HPI:  [name] is a 47 y.o. female here for consideration of hemorrhoid banding. She reports intermittent rectal pain and brbpr. Has chronic constipation. BM 1 per week. Has tried magnesium citrate in past. Stools not hard. Some rectal pain off/on not always related to BMs. Keeps pain in the stomach, lower abdomen. Appetite okay. Some acid reflux but no burning. Has been on Zantac for couple of years but doesn't feel like it helps. No dysphagia. Complains of nausea related to medications. Oxycodone given by ER recently for abdominal pain. Usually just takes Diclofenac as needed for leg pain. Reports several colonoscopies in the past. Saw surgeon once for skin tag in the perianal area but no intervention recommended.   Recent ED evaluation for abdominal pain. CT and labs as below. Intermittent elevation of AST/ALT without prior work-up. Liver ok on CT. ?related to lipitor.    Current Outpatient Prescriptions  Medication Sig Dispense Refill  . aspirin EC 81 MG tablet Take 325 mg by mouth daily.       Marland Kitchen atorvastatin (LIPITOR) 20 MG tablet Take 20 mg by mouth at bedtime.       . benazepril (LOTENSIN) 10 MG tablet Take 10 mg by mouth daily.       . diclofenac (VOLTAREN) 75 MG EC tablet Take 75 mg by mouth at bedtime. Prescribed one tablet twice daily      . fenofibrate (TRICOR) 145 MG tablet Take 145 mg by mouth daily.       Marland Kitchen gabapentin (NEURONTIN) 300 MG capsule Take 300 mg by mouth 3 (three) times daily.      . hydrOXYzine (VISTARIL) 50 MG capsule Take 50 mg by mouth daily as needed.       . metFORMIN (GLUCOPHAGE) 1000 MG tablet Take 1,000 mg by mouth 2 (two) times daily with a meal.       . oxyCODONE-acetaminophen (PERCOCET) 5-325 MG per tablet Take 2 tablets by mouth every 4 (four) hours as needed for pain.  20 tablet  0  . PRESCRIPTION  MEDICATION Place 4 drops into both eyes 4 (four) times daily. Eye drops compounded at  [facility] from patient's blood      . promethazine (PHENERGAN) 25 MG tablet Take 1 tablet (25 mg total) by mouth every 6 (six) hours as needed for nausea.  20 tablet  0  . ranitidine (ZANTAC) 150 MG tablet Take 150 mg by mouth 2 (two) times daily.       No current facility-administered medications for this visit.    Allergies as of 01/14/2013 - Review Complete 01/14/2013  Allergen Reaction Noted  . Shellfish allergy Anaphylaxis 12/15/2011    Past Medical History  Diagnosis Date  . Sarcoidosis   . Diabetes mellitus   . Hypertension   . Asthma   . Abnormal uterine bleeding (AUB) 11/26/2012  . BV (bacterial vaginosis) 11/26/2012  . Fatigue 12/24/2012  . Other and unspecified ovarian cyst 01/01/2013  . Fibroids 01/01/2013  . Hemorrhoid 01/01/2013    Past Surgical History  Procedure Laterality Date  . Tubal ligation    . Endometrial ablation    . Cholecystectomy      Family History  Problem Relation Age of Onset  . Diabetes Father   . Cancer Mother     throat  . Cancer Brother     lung  .  Colon cancer Neg Hx   . Breast cancer Cousin     History   Social History  . Marital Status: Single    Spouse Name: N/A    Number of Children: 2  . Years of Education: N/A   Occupational History  .     Social History Main Topics  . Smoking status: Former Games developer  . Smokeless tobacco: Never Used  . Alcohol Use: No  . Drug Use: No  . Sexual Activity: Not Currently   Other Topics Concern  . Not on file   Social History Narrative  . No narrative on file      ROS:  General: Negative for anorexia, weight loss, fever, chills, fatigue, weakness. Eyes: Negative for vision changes.  ENT: Negative for hoarseness, difficulty swallowing , nasal congestion. CV: Negative for chest pain, angina, palpitations, dyspnea on exertion, peripheral edema.  Respiratory: Negative for dyspnea at rest,  dyspnea on exertion, cough, sputum, wheezing.  GI: See history of present illness. GU:  Negative for dysuria, hematuria, urinary incontinence, urinary frequency, nocturnal urination.  MS: + for joint pain. No low back pain.  Derm: Negative for rash or itching.  Neuro: Negative for weakness, abnormal sensation, seizure, frequent headaches, memory loss, confusion.  Psych: Negative for anxiety, depression, suicidal ideation, hallucinations.  Endo: Negative for unusual weight change.  Heme: Negative for bruising or bleeding. Allergy: Negative for rash or hives.    Physical Examination:  BP 127/77  Pulse 99  Temp(Src) 97.5 F (36.4 C) (Oral)  Ht 5\' 6"  (1.676 m)  Wt 181 lb 3.2 oz (82.192 kg)  BMI 29.26 kg/m2  LMP 12/22/2012   General: Well-nourished, well-developed in no acute distress.  Head: Normocephalic, atraumatic.   Eyes: Conjunctiva pink, no icterus. Mouth: Oropharyngeal mucosa moist and pink , no lesions erythema or exudate. Neck: Supple without thyromegaly, masses, or lymphadenopathy.  Lungs: Clear to auscultation bilaterally.  Heart: Regular rate and rhythm, no murmurs rubs or gallops.  Abdomen: Bowel sounds are normal, nontender, nondistended, no hepatosplenomegaly or masses, no abdominal bruits or    hernia , no rebound or guarding.   Rectal: Thin, long (1/2 inch) skin tag noted at 6 oclock. Tenderness noted internally at same location. Could not carry out complete rectal exam due to discomfort. Did not feel mass or impaction.  Extremities: No lower extremity edema. No clubbing or deformities.  Neuro: Alert and oriented x 4 , grossly normal neurologically.  Skin: Warm and dry, no rash or jaundice.   Psych: Alert and cooperative, normal mood and affect.  Labs: Lab Results  Component Value Date   WBC 6.2 01/03/2013   HGB 12.2 01/03/2013   HCT 35.1* 01/03/2013   MCV 85.8 01/03/2013   PLT 255 01/03/2013   Lab Results  Component Value Date   CREATININE 0.81 01/03/2013    BUN 9 01/03/2013   NA 137 01/03/2013   K 3.2* 01/03/2013   CL 101 01/03/2013   CO2 24 01/03/2013   Lab Results  Component Value Date   ALT 48* 01/03/2013   AST 43* 01/03/2013   ALKPHOS 54 01/03/2013   BILITOT 0.3 01/03/2013   Lab Results  Component Value Date   LIPASE 25 01/03/2013   Hem negative 20-Jan-2013.  Imaging Studies: US Transvaginal Non-ob  2013-01-20   GYNECOLOGIC SONOGRAM   Darci Lykins is a 47 y.o. G2P2 LMP 12/22/2012 for a pelvic sonogram for  AUB, pelvic pain.  Uterus  10.2 x 7.3 x 5.4 cm, with multiple fibroids  noted, largest=47mm  Endometrium          3.5 mm, asymmetrical due to fibroids   Right ovary             5.8 x 4.7 x 4.7 cm, with 5.1 x 4.6 x 4.4 avascular  complex cystic mass noted with internal debris and septation noted   Left ovary                2.4 x 1.1 x 1.0 cm,   No free fluid noted   Technician Comments:  Fibroids as noted, Rt complex Ovarian cystic mass as noted    Chari Manning 01/01/2013 12:00 PM     Ct Abdomen Pelvis W Contrast  01/03/2013   CLINICAL DATA:  Right lower quadrant pain  EXAM: CT ABDOMEN AND PELVIS WITH CONTRAST  TECHNIQUE: Multidetector CT imaging of the abdomen and pelvis was performed using the standard protocol following bolus administration of intravenous contrast.  CONTRAST:  50mL OMNIPAQUE IOHEXOL 300 MG/ML SOLN, OMNIPAQUE IOHEXOL 300 MG/ML SOLN  COMPARISON:  11/30/2010  FINDINGS: Scarring is noted in the right lung base stable from the prior exam. No acute infiltrate or effusion is seen.  The gallbladder is been surgically removed. The liver, spleen, adrenal glands and pancreas are within normal limits. The kidneys are well visualized bilaterally and demonstrate a normal enhancement pattern. Delayed images demonstrate normal contrast excretion bilaterally. No renal calculi are seen. No obstructive changes are noted. The appendix is within normal limits. 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  2.5 cm predominately hypodense lesion is noted within the right ovary consistent with ovarian cyst. No free pelvic fluid is noted. No gross bony abnormality is noted.  IMPRESSION: Hypodensity within the right ovary likely representing a cyst. This may contribute to the patient's discomfort. No other focal abnormality is noted.   Electronically Signed   By: Alcide Clever M.D.   On: 01/03/2013 21:58

## 2013-01-14 NOTE — Assessment & Plan Note (Signed)
Rectal pain in setting of infrequent BMs. Skin tag noted on DRE and posterior tenderness at location of tag. 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 Trial of Anamantle. Effectively manage constipation. Start Amitiza BID. Review prior TCS reports. Further recommendations to be made with regards to possible hemorrhoid banding.

## 2013-01-14 NOTE — Patient Instructions (Addendum)
1. Once I review your past colonoscopy reports, we can make arrangements for hemorrhoid banding. 2. Start omeprazole 20mg  before breakfast daily for heartburn/acid reflux. RX sent to your pharmacy. 3. Stop Zantac. 4. Start Amitiza twice daily with food for constipation. RX sent to your pharmacy. 5. Try Anamantle cream twice a day for your rectal pain. RX sent to your pharmacy. 6. It is very important they we keep your stools soft and avoid straining when having a bowel movement.   Gastroesophageal Reflux Disease, Adult Gastroesophageal reflux disease (GERD) happens when acid from your stomach flows up into the esophagus. When acid comes in contact with the esophagus, the acid causes soreness (inflammation) in the esophagus. Over time, GERD may create small holes (ulcers) in the lining of the esophagus. CAUSES   Increased body weight. This puts pressure on the stomach, making acid rise from the stomach into the esophagus.  Smoking. This increases acid production in the stomach.  Drinking alcohol. This causes decreased pressure in the lower esophageal sphincter (valve or ring of muscle between the esophagus and stomach), allowing acid from the stomach into the esophagus.  Late evening meals and a full stomach. This increases pressure and acid production in the stomach.  A malformed lower esophageal sphincter. Sometimes, no cause is found. SYMPTOMS   Burning pain in the lower part of the mid-chest behind the breastbone and in the mid-stomach area. This may occur twice a week or more often.  Trouble swallowing.  Sore throat.  Dry cough.  Asthma-like symptoms including chest tightness, shortness of breath, or wheezing. DIAGNOSIS  Your caregiver may be able to diagnose GERD based on your symptoms. In some cases, X-rays and other tests may be done to check for complications or to check the condition of your stomach and esophagus. TREATMENT  Your caregiver may recommend  over-the-counter or prescription medicines to help decrease acid production. Ask your caregiver before starting or adding any new medicines.  HOME CARE INSTRUCTIONS   Change the factors that you can control. Ask your caregiver for guidance concerning weight loss, quitting smoking, and alcohol consumption.  Avoid foods and drinks that make your symptoms worse, such as:  Caffeine or alcoholic drinks.  Chocolate.  Peppermint or mint flavorings.  Garlic and onions.  Spicy foods.  Citrus fruits, such as oranges, lemons, or limes.  Tomato-based foods such as sauce, chili, salsa, and pizza.  Fried and fatty foods.  Avoid lying down for the 3 hours prior to your bedtime or prior to taking a nap.  Eat small, frequent meals instead of large meals.  Wear loose-fitting clothing. Do not wear anything tight around your waist that causes pressure on your stomach.  Raise the head of your bed 6 to 8 inches with wood blocks to help you sleep. Extra pillows will not help.  Only take over-the-counter or prescription medicines for pain, discomfort, or fever as directed by your caregiver.  Do not take aspirin, ibuprofen, or other nonsteroidal anti-inflammatory drugs (NSAIDs). SEEK IMMEDIATE MEDICAL CARE IF:   You have pain in your arms, neck, jaw, teeth, or back.  Your pain increases or changes in intensity or duration.  You develop nausea, vomiting, or sweating (diaphoresis).  You develop shortness of breath, or you faint.  Your vomit is green, yellow, black, or looks like coffee grounds or blood.  Your stool is red, bloody, or black. These symptoms could be signs of other problems, such as heart disease, gastric bleeding, or esophageal bleeding. MAKE SURE YOU:  Understand these instructions.  Will watch your condition.  Will get help right away if you are not doing well or get worse. Document Released: 12/27/2004 Document Revised: 06/11/2011 Document Reviewed:  10/06/2010 [facility] Patient Information 2014 [place], [place].

## 2013-01-15 LAB — HEPATITIS C ANTIBODY: HCV Ab: NEGATIVE

## 2013-01-19 DIAGNOSIS — A09 Infectious gastroenteritis and colitis, unspecified: Secondary | ICD-10-CM | POA: Diagnosis not present

## 2013-01-19 NOTE — Progress Notes (Signed)
Quick Note:  LFTs now normal. May be up at times related to medication ie Lipitor. Negative for viral hepatitis. Continue to monitor once per year with PCP. ______

## 2013-01-22 NOTE — Progress Notes (Signed)
Quick Note:  Letter mailed for pt to call for results. No phone number listed. ______

## 2013-01-27 ENCOUNTER — Telehealth: Payer: Self-pay | Admitting: *Deleted

## 2013-01-27 NOTE — Telephone Encounter (Signed)
Pt called returning phone call from letter please advise [phone]

## 2013-01-28 NOTE — Telephone Encounter (Signed)
She needs effective treatment of her constipation and anal fissure. Was she able to get the Amitiza. If not, we can try for Linzess. Let me know.   I have not seen any records yet regarding her prior colonoscopies. I need to see those prior to deciding about hemorrhoid banding. Please request again.

## 2013-01-28 NOTE — Telephone Encounter (Signed)
Pt had been mailed a letter to call for lab results ( no phone number was listed, but she has updated this info).  She called and was informed of results and she said she could not get some of her medication, insurance would not cover it. Also, the rectal cream was not covered. ( Per Raynelle Fanning, she spoke to the pharmacist about the Anamantle and the pt will just have to pay for that.   Pt also asked if Verlon Au had decided if she could have the hemorrhoid banding. Please advise!

## 2013-01-28 NOTE — Telephone Encounter (Signed)
Records should be from [facility].

## 2013-01-30 MED ORDER — LINACLOTIDE 290 MCG PO CAPS: 290.0000 ug | ORAL_CAPSULE | Freq: Every day | ORAL | Status: DC

## 2013-01-30 NOTE — Telephone Encounter (Signed)
Called and [facility] for pt. Told her to Stop the Amitiza and start Linzess. Rx sent in and call and let me know that she got the message.  Darl Pikes, can you request these records in Chelsey's absence? Thanks!

## 2013-01-30 NOTE — Telephone Encounter (Signed)
I called [facility] to requested records and Graciella Belton will be faxing those shorty.

## 2013-01-30 NOTE — Telephone Encounter (Signed)
I called pt. She said she is taking the Amitza 24 mcg bid with food. She still gets a little nauseated with it. Still does not have a good BM every day.Skips some days.   She said her insurance would not cover the Anusol and the pharmacist told her they would cover Protosol.  She is aware that we will try again to get the records from [facility] for [place] to review to make a decision on the hemorrhoid banding.

## 2013-01-30 NOTE — Telephone Encounter (Signed)
Let's get someone to request the records today since [name] is not there.  Lets stop the Amitiza and try Linzess daily. I sent RX to her pharmacy.

## 2013-01-30 NOTE — Telephone Encounter (Signed)
PT called and left VM that she did get my message.

## 2013-02-02 MED ORDER — NITROGLYCERIN 0.4 % RE OINT: 1.0000 [in_us] | TOPICAL_OINTMENT | Freq: Two times a day (BID) | RECTAL | Status: DC

## 2013-02-02 MED ORDER — HYDROCORTISONE 2.5 % RE CREA: TOPICAL_CREAM | Freq: Two times a day (BID) | RECTAL | Status: DC

## 2013-02-02 NOTE — Progress Notes (Signed)
Pt is aware of OV/banding on 11/19 at 215 with SF

## 2013-02-02 NOTE — Telephone Encounter (Signed)
Please see Dr. Darrick Penna addendum to my OV noted dated 01/14/13. Also see my instructions regarding proctosol and NTG paste.

## 2013-02-02 NOTE — Addendum Note (Signed)
Addended by: Tiffany Kocher on: 02/02/2013 10:49 AM   Modules accepted: Orders

## 2013-02-02 NOTE — Telephone Encounter (Signed)
Called and informed pt. She is aware and aware she will be called to schedule the OV with SF also for possible hemorrhoid banding.

## 2013-02-02 NOTE — Progress Notes (Signed)
PT is aware.

## 2013-02-02 NOTE — Progress Notes (Signed)
Please arrange for OV with [facility] only for anoscopy with possible [facility] banding if internal hemorrhoids seen.

## 2013-02-02 NOTE — Progress Notes (Addendum)
REVIEWED. APPT FOR ANOSCOPY FOLLOWED BY [facility] BANDING IF INTERNAL HEMORRHOIDS ARE SEEN.

## 2013-02-02 NOTE — Telephone Encounter (Signed)
Reviewed TCS from 07/2009 by Dr. Linna Darner, reported to be normal without rectal abnormalities.  I suspect her current pain/problem may be due to anal fissure.  Let's start her on the Proctosol but add nitrogylcerin paste too. I will see if Dr. Darrick Penna wants to bring her in for anoscopy and possible banding.

## 2013-02-04 ENCOUNTER — Telehealth: Payer: Self-pay

## 2013-02-04 NOTE — Telephone Encounter (Signed)
Pt called and said that one of her medicines was going to cost over $500.00. She did not know which one. I called pharmacist at [facility] in [place] and was told it was the nitroglycerin. They have sent paperwork for PA . ( pharmacist pt just wanted to know what it would cost straight out . They will not know what cost will be until the PA is done.

## 2013-02-07 ENCOUNTER — Emergency Department (HOSPITAL_COMMUNITY)
Admission: EM | Admit: 2013-02-07 | Discharge: 2013-02-07 | Disposition: A | Discharge status: Home / Self Care | Payer: Medicare Other | Attending: Emergency Medicine | Admitting: Emergency Medicine

## 2013-02-07 ENCOUNTER — Encounter (HOSPITAL_COMMUNITY): Payer: Self-pay | Admitting: Emergency Medicine

## 2013-02-07 ENCOUNTER — Emergency Department (HOSPITAL_COMMUNITY): Payer: Medicare Other

## 2013-02-07 DIAGNOSIS — Z7982 Long term (current) use of aspirin: Secondary | ICD-10-CM | POA: Diagnosis not present

## 2013-02-07 DIAGNOSIS — J45909 Unspecified asthma, uncomplicated: Secondary | ICD-10-CM | POA: Diagnosis not present

## 2013-02-07 DIAGNOSIS — R059 Cough, unspecified: Secondary | ICD-10-CM | POA: Diagnosis not present

## 2013-02-07 DIAGNOSIS — R05 Cough: Secondary | ICD-10-CM | POA: Diagnosis not present

## 2013-02-07 DIAGNOSIS — J209 Acute bronchitis, unspecified: Secondary | ICD-10-CM | POA: Insufficient documentation

## 2013-02-07 DIAGNOSIS — I10 Essential (primary) hypertension: Secondary | ICD-10-CM | POA: Diagnosis not present

## 2013-02-07 DIAGNOSIS — IMO0001 Reserved for inherently not codable concepts without codable children: Secondary | ICD-10-CM | POA: Diagnosis not present

## 2013-02-07 DIAGNOSIS — Z8619 Personal history of other infectious and parasitic diseases: Secondary | ICD-10-CM | POA: Diagnosis not present

## 2013-02-07 DIAGNOSIS — Z87891 Personal history of nicotine dependence: Secondary | ICD-10-CM | POA: Diagnosis not present

## 2013-02-07 DIAGNOSIS — Z79899 Other long term (current) drug therapy: Secondary | ICD-10-CM | POA: Diagnosis not present

## 2013-02-07 DIAGNOSIS — E119 Type 2 diabetes mellitus without complications: Secondary | ICD-10-CM | POA: Insufficient documentation

## 2013-02-07 DIAGNOSIS — N898 Other specified noninflammatory disorders of vagina: Secondary | ICD-10-CM | POA: Insufficient documentation

## 2013-02-07 DIAGNOSIS — R1110 Vomiting, unspecified: Secondary | ICD-10-CM | POA: Diagnosis not present

## 2013-02-07 DIAGNOSIS — IMO0002 Reserved for concepts with insufficient information to code with codable children: Secondary | ICD-10-CM | POA: Insufficient documentation

## 2013-02-07 DIAGNOSIS — J40 Bronchitis, not specified as acute or chronic: Secondary | ICD-10-CM

## 2013-02-07 DIAGNOSIS — J069 Acute upper respiratory infection, unspecified: Secondary | ICD-10-CM | POA: Insufficient documentation

## 2013-02-07 DIAGNOSIS — M2550 Pain in unspecified joint: Secondary | ICD-10-CM | POA: Insufficient documentation

## 2013-02-07 DIAGNOSIS — Z791 Long term (current) use of non-steroidal anti-inflammatories (NSAID): Secondary | ICD-10-CM | POA: Diagnosis not present

## 2013-02-07 DIAGNOSIS — R0989 Other specified symptoms and signs involving the circulatory and respiratory systems: Secondary | ICD-10-CM | POA: Diagnosis not present

## 2013-02-07 LAB — COMPREHENSIVE METABOLIC PANEL
AST: 21 U/L (ref 0–37)
Albumin: 4 g/dL (ref 3.5–5.2)
Alkaline Phosphatase: 58 U/L (ref 39–117)
BUN: 11 mg/dL (ref 6–23)
Calcium: 9.9 mg/dL (ref 8.4–10.5)
Chloride: 102 mEq/L (ref 96–112)
Creatinine, Ser: 0.72 mg/dL (ref 0.50–1.10)
GFR calc non Af Amer: >90 mL/min (ref >90)
Sodium: 138 mEq/L (ref 135–145)
Total Protein: 7.9 g/dL (ref 6.0–8.3)

## 2013-02-07 LAB — CBC WITH DIFFERENTIAL/PLATELET
Basophils Relative: 0 % (ref 0–1)
Eosinophils Absolute: 0.1 10*3/uL (ref 0.0–0.7)
Eosinophils Relative: 1 % (ref 0–5)
HCT: 33.4 % — ABNORMAL LOW (ref 36.0–46.0)
Hemoglobin: 11.5 g/dL — ABNORMAL LOW (ref 12.0–15.0)
MCH: 29.3 pg (ref 26.0–34.0)
MCHC: 34.4 g/dL (ref 30.0–36.0)
Monocytes Absolute: 0.5 10*3/uL (ref 0.1–1.0)
Monocytes Relative: 7 % (ref 3–12)
Neutro Abs: 4.5 10*3/uL (ref 1.7–7.7)
Neutrophils Relative %: 62 % (ref 43–77)
Platelets: 270 10*3/uL (ref 150–400)
RDW: 11.7 % (ref 11.5–15.5)

## 2013-02-07 LAB — GLUCOSE, CAPILLARY: Glucose-Capillary: 90 mg/dL (ref 70–99)

## 2013-02-07 MED ORDER — IPRATROPIUM BROMIDE 0.02 % IN SOLN
0.5000 mg | Freq: Once | RESPIRATORY_TRACT | Status: AC
  Administered 2013-02-07: 0.5 mg via RESPIRATORY_TRACT
  Filled 2013-02-07: qty 2.5

## 2013-02-07 MED ORDER — ONDANSETRON 4 MG PO TBDP
4.0000 mg | ORAL_TABLET | Freq: Once | ORAL | Status: AC
  Administered 2013-02-07: 4 mg via ORAL
  Filled 2013-02-07: qty 1

## 2013-02-07 MED ORDER — PHENYLEPH-DIPHENHYD-CODEINE 7.5-10-7.5 MG/5ML PO SYRP: 5.0000 mL | ORAL_SOLUTION | Freq: Four times a day (QID) | ORAL | Status: DC

## 2013-02-07 MED ORDER — ALBUTEROL SULFATE (5 MG/ML) 0.5% IN NEBU
2.5000 mg | INHALATION_SOLUTION | Freq: Once | RESPIRATORY_TRACT | Status: AC
  Administered 2013-02-07: 2.5 mg via RESPIRATORY_TRACT
  Filled 2013-02-07: qty 0.5

## 2013-02-07 MED ORDER — PREDNISONE 50 MG PO TABS
60.0000 mg | ORAL_TABLET | Freq: Once | ORAL | Status: AC
  Administered 2013-02-07: 60 mg via ORAL
  Filled 2013-02-07 (×2): qty 1

## 2013-02-07 MED ORDER — HYDROCOD POLST-CHLORPHEN POLST 10-8 MG/5ML PO LQCR
5.0000 mL | Freq: Once | ORAL | Status: AC
  Administered 2013-02-07: 5 mL via ORAL
  Filled 2013-02-07: qty 5

## 2013-02-07 MED ORDER — AZITHROMYCIN 250 MG PO TABS: ORAL_TABLET | ORAL | Status: DC

## 2013-02-07 MED ORDER — PREDNISONE 10 MG PO TABS: ORAL_TABLET | ORAL | Status: DC

## 2013-02-07 MED ORDER — BENZONATATE 100 MG PO CAPS: 100.0000 mg | ORAL_CAPSULE | Freq: Three times a day (TID) | ORAL | Status: DC

## 2013-02-07 NOTE — ED Notes (Signed)
Productive cough, green in color which began Sunday. Pt also states cough is so bad at times that it causes her to vomit. ALso states vomiting without coughing. States "aching" to left side.

## 2013-02-07 NOTE — Progress Notes (Signed)
Direct incoming call from [id number]- [phone]. Pharmacist Rozell Searing reports the Phenyleph / Diphenhyd is not in stock and needs a call back to clarify what she should prescribe instead. Case Manager RN explained she will give request to the PA and they will give a return call with instructions.Pharmacist verbalizes her understanding.Medication request sheet filled out  And patient demographics verified in EPIC.

## 2013-02-07 NOTE — ED Notes (Signed)
Cough since Monday w/n/v/d at least 2 x day.  Unable to keep anything down.  Relates chest discomfort with deep breathing, feels "sore".  C/O generalized abdominal pain.  Denies nausea at present.

## 2013-02-07 NOTE — ED Provider Notes (Signed)
CSN: [id number]     Arrival date & time 02/07/13  0757 History   First MD Initiated Contact with Patient 02/07/13 [phone]     Chief Complaint  Patient presents with  . Cough  . Emesis   (Consider location/radiation/quality/duration/timing/severity/associated sxs/prior Treatment) Patient is a 47 y.o. female presenting with cough and vomiting. The history is provided by the patient.  Cough Cough characteristics:  Productive Sputum characteristics:  Green Severity:  Moderate Onset quality:  Gradual Duration:  6 days Timing:  Intermittent Progression:  Worsening Chronicity:  New Smoker: no   Context: weather changes   Relieved by:  Nothing Worsened by:  Nothing tried Associated symptoms: myalgias, sinus congestion and wheezing   Associated symptoms: no chest pain, no eye discharge, no rash and no shortness of breath   Risk factors: no recent travel   Emesis Associated symptoms: arthralgias and myalgias   Associated symptoms: no abdominal pain     Past Medical History  Diagnosis Date  . Sarcoidosis   . Diabetes mellitus   . Hypertension   . Asthma   . Abnormal uterine bleeding (AUB) 11/26/2012  . BV (bacterial vaginosis) 11/26/2012  . Fatigue 12/24/2012  . Other and unspecified ovarian cyst 01/01/2013  . Fibroids 01/01/2013  . Hemorrhoid 01/01/2013   Past Surgical History  Procedure Laterality Date  . Tubal ligation    . Endometrial ablation    . Cholecystectomy     Family History  Problem Relation Age of Onset  . Diabetes Father   . Cancer Mother     throat  . Cancer Brother     lung  . Colon cancer Neg Hx   . Breast cancer Cousin    History  Substance Use Topics  . Smoking status: Former Games developer  . Smokeless tobacco: Never Used  . Alcohol Use: No   OB History   Grav Para Term Preterm Abortions TAB SAB Ect Mult Living   2 2        2      Review of Systems  Constitutional: Negative for activity change.       All ROS Neg except as noted in HPI  HENT: Negative  for nosebleeds.   Eyes: Negative for photophobia and discharge.  Respiratory: Positive for cough and wheezing. Negative for shortness of breath.   Cardiovascular: Negative for chest pain and palpitations.  Gastrointestinal: Positive for vomiting. Negative for abdominal pain and blood in stool.  Genitourinary: Positive for vaginal bleeding. Negative for dysuria, frequency and hematuria.  Musculoskeletal: Positive for arthralgias and myalgias. Negative for back pain and neck pain.  Skin: Negative.  Negative for rash.  Neurological: Negative for dizziness, seizures and speech difficulty.  Psychiatric/Behavioral: Negative for hallucinations and confusion.    Allergies  Shellfish allergy  Home Medications   Current Outpatient Rx  Name  Route  Sig  Dispense  Refill  . aspirin EC 81 MG tablet   Oral   Take 325 mg by mouth daily.          Marland Kitchen atorvastatin (LIPITOR) 20 MG tablet   Oral   Take 20 mg by mouth at bedtime.          . benazepril (LOTENSIN) 10 MG tablet   Oral   Take 10 mg by mouth daily.          . diclofenac (VOLTAREN) 75 MG EC tablet   Oral   Take 75 mg by mouth at bedtime. Prescribed one tablet twice daily         .  fenofibrate (TRICOR) 145 MG tablet   Oral   Take 145 mg by mouth daily.          Marland Kitchen gabapentin (NEURONTIN) 300 MG capsule   Oral   Take 300 mg by mouth 3 (three) times daily.         . hydrocortisone (ANUSOL-HC) 2.5 % rectal cream   Rectal   Place rectally 2 (two) times daily.   30 g   1   . hydrOXYzine (VISTARIL) 50 MG capsule   Oral   Take 50 mg by mouth daily as needed.          . lidocaine-hydrocortisone (ANAMANTEL HC) 3-0.5 % CREA   Rectal   Place 1 Applicatorful rectally 2 (two) times daily. For three weeks.   30 g   0   . Linaclotide (LINZESS) 290 MCG CAPS capsule   Oral   Take 1 capsule (290 mcg total) by mouth daily before breakfast.   30 capsule   11   . metFORMIN (GLUCOPHAGE) 1000 MG tablet   Oral   Take 1,000  mg by mouth 2 (two) times daily with a meal.          . Nitroglycerin 0.4 % OINT   Rectal   Place 1 inch rectally 2 (two) times daily. Apply 1 inch of ointment to a gloved finger and insert rectally twice a day.   30 g   0   . omeprazole (PRILOSEC) 20 MG capsule   Oral   Take 1 capsule (20 mg total) by mouth daily before breakfast.   30 capsule   5   . oxyCODONE-acetaminophen (PERCOCET) 5-325 MG per tablet   Oral   Take 2 tablets by mouth every 4 (four) hours as needed for pain.   20 tablet   0   . PRESCRIPTION MEDICATION   Both Eyes   Place 4 drops into both eyes 4 (four) times daily. Eye drops compounded at [facility] from patient's blood         . promethazine (PHENERGAN) 25 MG tablet   Oral   Take 1 tablet (25 mg total) by mouth every 6 (six) hours as needed for nausea.   20 tablet   0    BP 118/59  Pulse 83  Temp(Src) 97.9 F (36.6 C) (Oral)  Resp 16  Ht 5\' 6"  (1.676 m)  Wt 180 lb (81.647 kg)  BMI 29.07 kg/m2  SpO2 100%  LMP 11/19/2012 Physical Exam  Nursing note and vitals reviewed. Constitutional: She is oriented to person, place, and time. She appears well-developed and well-nourished.  Non-toxic appearance.  HENT:  Head: Normocephalic.  Right Ear: Tympanic membrane and external ear normal.  Left Ear: Tympanic membrane and external ear normal.  Nasal congestion  Eyes: EOM and lids are normal. Pupils are equal, round, and reactive to light.  Neck: Normal range of motion. Neck supple. Carotid bruit is not present.  Cardiovascular: Normal rate, regular rhythm, normal heart sounds, intact distal pulses and normal pulses.   Pulmonary/Chest: No respiratory distress. She has wheezes. She has rhonchi.  Abdominal: Soft. Bowel sounds are normal. There is no tenderness. There is no guarding.  Musculoskeletal: Normal range of motion.  Lymphadenopathy:       Head (right side): No submandibular adenopathy present.       Head (left side): No submandibular  adenopathy present.    She has no cervical adenopathy.  Neurological: She is alert and oriented to person, place, and time. She has  normal strength. No cranial nerve deficit or sensory deficit.  Skin: Skin is warm and dry. No rash noted.  Psychiatric: She has a normal mood and affect. Her speech is normal.    ED Course  Procedures (including critical care time) Labs Review Labs Reviewed - No data to display Imaging Review No results found. PUlse Ox 100% on room air. WNL by my interpretation. EKG Interpretation   None       MDM  No diagnosis found. *I have reviewed nursing notes, vital signs, and all appropriate lab and imaging results for this patient.**  Labs non-acute. Chest xray is negative for pneumonia. Pt speaks in complete sentences. Breathing better after meds. Feel that it is safe for pt to be d/c home. Rx for Endal, prednisone, and azithromycin.  Kathie Dike, PA-C 02/07/13 1005

## 2013-02-07 NOTE — ED Provider Notes (Signed)
Medical screening examination/treatment/procedure(s) were performed by non-physician practitioner and as supervising physician I was immediately available for consultation/collaboration.  EKG Interpretation   None         Layla Maw Jahlisa Rossitto, DO 02/07/13 1606

## 2013-02-12 ENCOUNTER — Encounter: Payer: Self-pay | Admitting: Adult Health

## 2013-02-12 ENCOUNTER — Ambulatory Visit (INDEPENDENT_AMBULATORY_CARE_PROVIDER_SITE_OTHER): Payer: Medicare Other

## 2013-02-12 ENCOUNTER — Ambulatory Visit (INDEPENDENT_AMBULATORY_CARE_PROVIDER_SITE_OTHER): Payer: Medicare Other | Admitting: Adult Health

## 2013-02-12 ENCOUNTER — Other Ambulatory Visit: Payer: Self-pay | Admitting: Adult Health

## 2013-02-12 VITALS — BP 120/64 | Ht 66.0 in | Wt 176.0 lb

## 2013-02-12 DIAGNOSIS — N898 Other specified noninflammatory disorders of vagina: Secondary | ICD-10-CM

## 2013-02-12 DIAGNOSIS — N83209 Unspecified ovarian cyst, unspecified side: Secondary | ICD-10-CM

## 2013-02-12 DIAGNOSIS — B379 Candidiasis, unspecified: Secondary | ICD-10-CM | POA: Diagnosis not present

## 2013-02-12 DIAGNOSIS — D219 Benign neoplasm of connective and other soft tissue, unspecified: Secondary | ICD-10-CM

## 2013-02-12 DIAGNOSIS — D259 Leiomyoma of uterus, unspecified: Secondary | ICD-10-CM | POA: Diagnosis not present

## 2013-02-12 DIAGNOSIS — Z8742 Personal history of other diseases of the female genital tract: Secondary | ICD-10-CM | POA: Diagnosis not present

## 2013-02-12 HISTORY — DX: Other specified noninflammatory disorders of vagina: N89.8

## 2013-02-12 LAB — POCT WET PREP (WET MOUNT)

## 2013-02-12 MED ORDER — FLUCONAZOLE 150 MG PO TABS: ORAL_TABLET | ORAL | Status: DC

## 2013-02-12 NOTE — Progress Notes (Signed)
Subjective:     Patient ID: Anira Senegal, female   DOB: [birth year], 47 y.o.   MRN: [id number]  HPI Tiffini is a 47 year old black female in for [place] to see if ovarian cyst resolved.She is complaining of vaginal itch and burning has had antibiotics recently for URI.She has seen GI and has follow up scheduled for banding.   Review of Systems See HPI Reviewed past medical,surgical, social and family history. Reviewed medications and allergies.     Objective:   Physical Exam BP 120/64  Ht 5\' 6"  (1.676 m)  Wt 176 lb (79.833 kg)  BMI 28.42 kg/m2  LMP 12/22/2012 Skin warm and dry.Pelvic: external genitalia is normal in appearance, vagina: white discharge without odor, cervix:smooth and bulbous, uterus: normal size, shape and contour, non tender, no masses felt, adnexa: no masses or tenderness noted. Wet prep: + for yeast Reviewed [place] with pt, has fibroids as before but cyst resolved  Assessment:     History of ovarian cyst-resolved Vaginal discharge Yeast Fibroids     Plan:     Rx diflucan 150 mg 1 now and 1 in 3 days with 1 refill, do not take lipitor on day takes diflucan Follow up in 1 month for pap and physical Review handout on yeast Get flu shot when well

## 2013-02-12 NOTE — Patient Instructions (Signed)
Monilial Vaginitis Vaginitis in a soreness, swelling and redness (inflammation) of the vagina and vulva. Monilial vaginitis is not a sexually transmitted infection. CAUSES  Yeast vaginitis is caused by yeast (candida) that is normally found in your vagina. With a yeast infection, the candida has overgrown in number to a point that upsets the chemical balance. SYMPTOMS   White, thick vaginal discharge.  Swelling, itching, redness and irritation of the vagina and possibly the lips of the vagina (vulva).  Burning or painful urination.  Painful intercourse. DIAGNOSIS  Things that may contribute to monilial vaginitis are:  Postmenopausal and virginal states.  Pregnancy.  Infections.  Being tired, sick or stressed, especially if you had monilial vaginitis in the past.  Diabetes. Good control will help lower the chance.  Birth control pills.  Tight fitting garments.  Using bubble bath, feminine sprays, douches or deodorant tampons.  Taking certain medications that kill germs (antibiotics).  Sporadic recurrence can occur if you become ill. TREATMENT  Your caregiver will give you medication.  There are several kinds of anti monilial vaginal creams and suppositories specific for monilial vaginitis. For recurrent yeast infections, use a suppository or cream in the vagina 2 times a week, or as directed.  Anti-monilial or steroid cream for the itching or irritation of the vulva may also be used. Get your caregiver's permission.  Painting the vagina with methylene blue solution may help if the monilial cream does not work.  Eating yogurt may help prevent monilial vaginitis. HOME CARE INSTRUCTIONS   Finish all medication as prescribed.  Do not have sex until treatment is completed or after your caregiver tells you it is okay.  Take warm sitz baths.  Do not douche.  Do not use tampons, especially scented ones.  Wear cotton underwear.  Avoid tight pants and panty  hose.  Tell your sexual partner that you have a yeast infection. They should go to their caregiver if they have symptoms such as mild rash or itching.  Your sexual partner should be treated as well if your infection is difficult to eliminate.  Practice safer sex. Use condoms.  Some vaginal medications cause latex condoms to fail. Vaginal medications that harm condoms are:  Cleocin cream.  Butoconazole (Femstat).  Terconazole (Terazol) vaginal suppository.  Miconazole (Monistat) (may be purchased over the counter). SEEK MEDICAL CARE IF:   You have a temperature by mouth above 102 F (38.9 C).  The infection is getting worse after 2 days of treatment.  The infection is not getting better after 3 days of treatment.  You develop blisters in or around your vagina.  You develop vaginal bleeding, and it is not your menstrual period.  You have pain when you urinate.  You develop intestinal problems.  You have pain with sexual intercourse. Document Released: 12/27/2004 Document Revised: 06/11/2011 Document Reviewed: 09/10/2008 [facility] Patient Information 2014 [place], [place]. Do no take atorvastatin the day you take diflucan Follow up prn

## 2013-02-17 NOTE — Telephone Encounter (Signed)
Working on PA

## 2013-02-18 ENCOUNTER — Ambulatory Visit (INDEPENDENT_AMBULATORY_CARE_PROVIDER_SITE_OTHER): Payer: Medicare Other | Admitting: Gastroenterology

## 2013-02-18 ENCOUNTER — Encounter: Payer: Self-pay | Admitting: Gastroenterology

## 2013-02-18 VITALS — BP 120/77 | HR 97 | Temp 97.5°F | Ht 66.0 in | Wt 175.6 lb

## 2013-02-18 DIAGNOSIS — K648 Other hemorrhoids: Secondary | ICD-10-CM | POA: Diagnosis not present

## 2013-02-18 NOTE — Progress Notes (Signed)
SYMPTOMS: HAS RECTAL BLEEDING, PRESSURE, PAIN, ITCHING, AND BURNING. HAS TRIED SUPP AND CREAMS WHICH HELPS BUT SX COMES BACK. HAD TCS BY DR. ANWAR < 5 YEARS AGO.  CONSTIPATION: NO DIARRHEA: NO  STRAINS WITH BMs: YES  TIME SPENT ON TOILET: 20 MINS TISSUE POKES OUT OF RECTUM: YES- GOES BACK BY ITSELF FIBER SUPPLEMENTS: NO  GLASSES OF WATER/DAY: 6-8: NO   ADDITIONAL QUESTIONS:  LATEX ALLERGY: NO PREGNANT: NO ERECTILE DYSFUNCTION MEDS OR NITRATES: NO ANTICOAGULATION/ANTIPLATELET MEDS: NO DIAGNOSED WITH CROHN'S DISEASE, PROCTITIS, PORTAL HTN, OR ANAL/RECTAL CA: NO TAKING IMMUNOSUPPRESSANTS/XRT: NO  PLAN: 1. ANOSCOPY->[facility] BANDING    PROCEDURE TECHNIQUE: BENEFITS RISK EXPLAINED TO PT. ANOSCOPY PERFORMED. BULGING INTERNAL HEMORRHOID COLUMN IN THE R POSTERIOR, LEFT LATERAL, AND ANTERIOR COLUMNS. ONE [facility] BAND PLACED IN RIGHT POSTERIOR POSITION. POST-BANDING RECTAL EXAM REVEALED GOOD PLACEMENT. EXAM NON-TENDER.    Primary Care Physician:  Toma Deiters, MD Primary Gastroenterologist:  Dr. Darrick Penna  Past Medical History  Diagnosis Date  . Sarcoidosis > 5 YEARS  . Diabetes mellitus   . Hypertension   . Asthma   . Abnormal uterine bleeding (AUB) 11/26/2012  . BV (bacterial vaginosis) 11/26/2012  . Fatigue 12/24/2012  . Other and unspecified ovarian cyst 01/01/2013  . Fibroids 01/01/2013  . Hemorrhoid 01/01/2013  . Vaginal discharge 02/12/2013    +yeast   Past Surgical History  Procedure Laterality Date  . Tubal ligation    . Endometrial ablation    . Cholecystectomy      Prior to Admission medications   Medication Sig Start Date End Date Taking? Authorizing Provider  aspirin EC 81 MG tablet Take 325 mg by mouth daily.    Yes Historical Provider, MD  atorvastatin (LIPITOR) 20 MG tablet Take 20 mg by mouth at bedtime.    Yes Historical Provider, MD  fenofibrate (TRICOR) 145 MG tablet Take 145 mg by mouth daily.    Yes Historical Provider, MD  gabapentin (NEURONTIN) 300 MG  capsule Take 300 mg by mouth 3 (three) times daily.   Yes Historical Provider, MD  metFORMIN (GLUCOPHAGE) 1000 MG tablet Take 1,000 mg by mouth 2 (two) times daily with a meal.    Yes Historical Provider, MD  ranitidine (ZANTAC) 150 MG tablet Take 150 mg by mouth 2 (two) times daily.   Yes Historical Provider, MD  benazepril (LOTENSIN) 10 MG tablet Take 10 mg by mouth daily.     Historical Provider, MD  benzonatate (TESSALON) 100 MG capsule Take 1 capsule (100 mg total) by mouth every 8 (eight) hours. 02/07/13   Irish Elders, NP  Dextromethorphan-Guaifenesin (MUCINEX DM MAXIMUM STRENGTH) 60-1200 MG TB12 Take 1 tablet by mouth 2 (two) times daily.    Historical Provider, MD  diclofenac (VOLTAREN) 75 MG EC tablet Take 75 mg by mouth 2 (two) times daily as needed for mild pain.     Historical Provider, MD  promethazine (PHENERGAN) 25 MG tablet Take 1 tablet (25 mg total) by mouth every 6 (six) hours as needed for nausea. 01/03/13   Donnetta Hutching, MD    Allergies as of 02/18/2013 - Review Complete 02/18/2013  Allergen Reaction Noted  . Shellfish allergy Anaphylaxis 12/15/2011    Family History  Problem Relation Age of Onset  . Diabetes Father   . Cancer Mother     throat  . Cancer Brother     lung  . Colon cancer Neg Hx   . Breast cancer Cousin     History   Social History  . Marital Status:  Single    Spouse Name: N/A    Number of Children: 2  . Years of Education: N/A   Occupational History  .     Social History Main Topics  . Smoking status: Former Games developer  . Smokeless tobacco: Never Used     Comment: Quit  5 years  . Alcohol Use: No  . Drug Use: No  . Sexual Activity: Not Currently   Review of Systems: See HPI, otherwise negative ROS   Physical Exam: BP 120/77  Pulse 97  Temp(Src) 97.5 F (36.4 C) (Oral)  Ht 5\' 6"  (1.676 m)  Wt 175 lb 9.6 oz (79.652 kg)  BMI 28.36 kg/m2  LMP 12/22/2012 General:   Alert,  pleasant and cooperative in NAD Head:  Normocephalic and  atraumatic. Neck:  Supple; Lungs:  Clear throughout to auscultation.    Heart:  Regular rate and rhythm. Abdomen:  Soft, nontender and nondistended. Normal bowel sounds, without guarding, and without rebound.   Neurologic:  Alert and  oriented x4;  grossly normal neurologically.  Impression/Plan:

## 2013-02-18 NOTE — Progress Notes (Signed)
Cc PCP 

## 2013-02-18 NOTE — Patient Instructions (Signed)
FOLLOW A HIGH FIBER DIET. AVOID ITEMS THAT CAUSE BLOATING AND GAS. SEE INFO BELOW.  DRINK WATER TO KEEP YOUR URINE LIGHT YELLOW.  USE FIBER POWDER OR 1 PACKET ONCE DAILY FOR 3 DAYS THEN TWICE DAILY FOR 3 DAYS THEN THREE TIMES A DAY. AVOID HIGHER DOSES IF IT CAUSES BLOATING & GAS.  SIT FOR LESS THAN 5 MINUTES ON THE COMMODE.  YOU MAY USE HEMORRHOID CREAM.  FOLLOW UP DEC 3 FOR ADDITIONAL BANDING. TO COMPLETELY RESOLVE YOUR SYMPTOMS YOU WILL NEED 3-4 VISITS.     High-Fiber Diet A high-fiber diet changes your normal diet to include more whole grains, legumes, fruits, and vegetables. Changes in the diet involve replacing refined carbohydrates with unrefined foods. The calorie level of the diet is essentially unchanged. The Dietary Reference Intake (recommended amount) for adult males is 38 grams per day. For adult females, it is 25 grams per day. Pregnant and lactating women should consume 28 grams of fiber per day. Fiber is the intact part of a plant that is not broken down during digestion. Functional fiber is fiber that has been isolated from the plant to provide a beneficial effect in the body. PURPOSE  Increase stool bulk.   Ease and regulate bowel movements.   Lower cholesterol.  INDICATIONS THAT YOU NEED MORE FIBER  Constipation and hemorrhoids.   Uncomplicated diverticulosis (intestine condition) and irritable bowel syndrome.   Weight management.   As a protective measure against hardening of the arteries (atherosclerosis), diabetes, and cancer.   DO NOT USE WITH:  Acute diverticulitis (intestine infection).   Partial small bowel obstructions.   Complicated diverticular disease involving bleeding, rupture (perforation), or abscess (boil, furuncle).   Presence of autonomic neuropathy (nerve damage) or gastroparesis (stomach cannot empty itself).    GUIDELINES FOR INCREASING FIBER IN THE DIET  Start adding fiber to the diet slowly. A gradual increase of about 5 more  grams (2 slices of whole-wheat bread, 2 servings of most fruits or vegetables, or 1 bowl of high-fiber cereal) per day is best. Too rapid an increase in fiber may result in constipation, flatulence, and bloating.   Drink enough water and fluids to keep your urine clear or pale yellow. Water, juice, or caffeine-free drinks are recommended. Not drinking enough fluid may cause constipation.   Eat a variety of high-fiber foods rather than one type of fiber.   Try to increase your intake of fiber through using high-fiber foods rather than fiber pills or supplements that contain small amounts of fiber.   The goal is to change the types of food eaten. Do not supplement your present diet with high-fiber foods, but replace foods in your present diet.    INCLUDE A VARIETY OF FIBER SOURCES  Replace refined and processed grains with whole grains, canned fruits with fresh fruits, and incorporate other fiber sources. White rice, white breads, and most bakery goods contain little or no fiber.   Brown whole-grain rice, buckwheat oats, and many fruits and vegetables are all good sources of fiber. These include: broccoli, Brussels sprouts, cabbage, cauliflower, beets, sweet potatoes, white potatoes (skin on), carrots, tomatoes, eggplant, squash, berries, fresh fruits, and dried fruits.   Cereals appear to be the richest source of fiber. Cereal fiber is found in whole grains and bran. Bran is the fiber-rich outer coat of cereal grain, which is largely removed in refining. In whole-grain cereals, the bran remains. In breakfast cereals, the largest amount of fiber is found in those with "bran" in their names.  The fiber content is sometimes indicated on the label.   You may need to include additional fruits and vegetables each day.   In baking, for 1 cup white flour, you may use the following substitutions:   1 cup whole-wheat flour minus 2 tablespoons.   1/2 cup white flour plus 1/2 cup whole-wheat flour.

## 2013-02-18 NOTE — Assessment & Plan Note (Signed)
COMPLICATED BY RECTAL BLEEDING/PAIN.  ANTICIPATE PT WILL NEED 3 MAYBE 4 BANDS. R ANT BAND PLACEMENT IN 2 WEEKS DRINK WATER EAT FIBER R ANT [facility] DEC 3

## 2013-03-04 ENCOUNTER — Encounter: Payer: Self-pay | Admitting: Gastroenterology

## 2013-03-04 ENCOUNTER — Ambulatory Visit (INDEPENDENT_AMBULATORY_CARE_PROVIDER_SITE_OTHER): Payer: Medicare Other | Admitting: Gastroenterology

## 2013-03-04 VITALS — BP 112/69 | HR 94 | Temp 97.7°F | Ht 66.0 in | Wt 173.8 lb

## 2013-03-04 DIAGNOSIS — K648 Other hemorrhoids: Secondary | ICD-10-CM

## 2013-03-04 DIAGNOSIS — K603 Anal fistula, unspecified: Secondary | ICD-10-CM | POA: Insufficient documentation

## 2013-03-04 DIAGNOSIS — K649 Unspecified hemorrhoids: Secondary | ICD-10-CM

## 2013-03-04 NOTE — Patient Instructions (Signed)
USE CREAM FOR RECTAL PAIN.  FOLLOW A HIGH FIBER DIET. AVOID ITEMS THAT CAUSE BLOATING AND GAS. SEE INFO BELOW.  DRINK WATER TO KEEP HER URINE LIGHT YELLOW.  USE FIBER POWDER OR 1 PACKET ONCE DAILY FOR 3 DAYS THEN TWICE DAILY FOR 3 DAYS THEN THREE TIMES A DAY. AVOID HIGHER DOSES IF IT CAUSES BLOATING & GAS.  SIT FOR LESS THAN 5 MINUTES ON THE COMMODE.  FOLLOW UP IN Mar 18, 2013.    High-Fiber Diet A high-fiber diet changes your normal diet to include more whole grains, legumes, fruits, and vegetables. Changes in the diet involve replacing refined carbohydrates with unrefined foods. The calorie level of the diet is essentially unchanged. The Dietary Reference Intake (recommended amount) for adult males is 38 grams per day. For adult females, it is 25 grams per day. Pregnant and lactating women should consume 28 grams of fiber per day. Fiber is the intact part of a plant that is not broken down during digestion. Functional fiber is fiber that has been isolated from the plant to provide a beneficial effect in the body. PURPOSE  Increase stool bulk.   Ease and regulate bowel movements.   Lower cholesterol.  INDICATIONS THAT YOU NEED MORE FIBER  Constipation and hemorrhoids.   Uncomplicated diverticulosis (intestine condition) and irritable bowel syndrome.   Weight management.   As a protective measure against hardening of the arteries (atherosclerosis), diabetes, and cancer.   DO NOT USE WITH:  Acute diverticulitis (intestine infection).   Partial small bowel obstructions.   Complicated diverticular disease involving bleeding, rupture (perforation), or abscess (boil, furuncle).   Presence of autonomic neuropathy (nerve damage) or gastroparesis (stomach cannot empty itself).    GUIDELINES FOR INCREASING FIBER IN THE DIET  Start adding fiber to the diet slowly. A gradual increase of about 5 more grams (2 slices of whole-wheat bread, 2 servings of most fruits or vegetables, or  1 bowl of high-fiber cereal) per day is best. Too rapid an increase in fiber may result in constipation, flatulence, and bloating.   Drink enough water and fluids to keep your urine clear or pale yellow. Water, juice, or caffeine-free drinks are recommended. Not drinking enough fluid may cause constipation.   Eat a variety of high-fiber foods rather than one type of fiber.   Try to increase your intake of fiber through using high-fiber foods rather than fiber pills or supplements that contain small amounts of fiber.   The goal is to change the types of food eaten. Do not supplement your present diet with high-fiber foods, but replace foods in your present diet.    INCLUDE A VARIETY OF FIBER SOURCES  Replace refined and processed grains with whole grains, canned fruits with fresh fruits, and incorporate other fiber sources. White rice, white breads, and most bakery goods contain little or no fiber.   Brown whole-grain rice, buckwheat oats, and many fruits and vegetables are all good sources of fiber. These include: broccoli, Brussels sprouts, cabbage, cauliflower, beets, sweet potatoes, white potatoes (skin on), carrots, tomatoes, eggplant, squash, berries, fresh fruits, and dried fruits.   Cereals appear to be the richest source of fiber. Cereal fiber is found in whole grains and bran. Bran is the fiber-rich outer coat of cereal grain, which is largely removed in refining. In whole-grain cereals, the bran remains. In breakfast cereals, the largest amount of fiber is found in those with "bran" in their names. The fiber content is sometimes indicated on the label.   You  may need to include additional fruits and vegetables each day.   In baking, for 1 cup white flour, you may use the following substitutions:   1 cup whole-wheat flour minus 2 tablespoons.   1/2 cup white flour plus 1/2 cup whole-wheat flour.

## 2013-03-04 NOTE — Progress Notes (Addendum)
SYMPTOMS: RECTAL BLEEDING: SAME, PAIN/PRESSURE:SAME, ITCHING/BURNING: SAME. PT COULD NOT AFFORD RECTIV.  CONSTIPATION: NO DIARRHEA: no  STRAINS WITH BMs: NO  TIME SPENT ON TOILET: 15 MINS TISSUE POKES OUT OF RECTUM: YES- GOES BACK BY ITSELF FIBER SUPPLEMENTS: NOT YET GLASSES OF WATER/DAY: 6-8-NOT YET   ADDITIONAL QUESTIONS:  LATEX ALLERGY: NO PREGNANT: NO ERECTILE DYSFUNCTION MEDS OR NITRATES: NO ANTICOAGULATION/ANTIPLATELET MEDS: NO DIAGNOSED WITH CROHN'S DISEASE, PROCTITIS, PORTAL HTN, OR ANAL/RECTAL CA: NO TAKING IMMUNOSUPPRESSANTS/XRT: NO  Plan  1.R ANT CRH BANDING TODAY 2. L LATERAL BANDING WITH LIDOCAINE IN 2-3 WEEKS   PROCEDURE TECHNIQUE: BENEFITS RISK EXPLAINED TO PT.  RECTAL EXAM TENDER IN THE ANT AND POS MIDLINE OF THE ANAL CANAL. ONE [facility] BAND PLACED IN RIGHT ANTERIOR POSITION. POST-BANDING RECTAL EXAM REVEALED GOOD PLACEMENT. EXAM NON-TENDER.

## 2013-03-05 NOTE — Assessment & Plan Note (Signed)
SX NOT IDEALLY CONTROLLED.  PT CAN'T AFFORD RECTIV. MAY USE RECTAL CREAM PRN. EXPLAINED TO PT THAT HER SX WILL BE PROLONGED WITHOUT RECTIV AND SHE MAY STILL SEE RECTAL BLEEDING AND HAVE RECTAL PAIN IF HER ANAL FISSURE CLOSES SLOWLY.

## 2013-03-05 NOTE — Assessment & Plan Note (Signed)
L LAT BAND IN 2 WEEKS 

## 2013-03-16 ENCOUNTER — Encounter: Payer: Self-pay | Admitting: Adult Health

## 2013-03-16 ENCOUNTER — Ambulatory Visit (INDEPENDENT_AMBULATORY_CARE_PROVIDER_SITE_OTHER): Payer: Medicare Other | Admitting: Adult Health

## 2013-03-16 ENCOUNTER — Other Ambulatory Visit (HOSPITAL_COMMUNITY)
Admission: RE | Admit: 2013-03-16 | Discharge: 2013-03-16 | Disposition: A | Discharge status: Home / Self Care | Payer: Medicare Other | Source: Ambulatory Visit | Attending: Adult Health | Admitting: Adult Health

## 2013-03-16 ENCOUNTER — Encounter (INDEPENDENT_AMBULATORY_CARE_PROVIDER_SITE_OTHER): Payer: Self-pay

## 2013-03-16 VITALS — BP 124/76 | HR 74 | Ht 66.0 in | Wt 174.0 lb

## 2013-03-16 DIAGNOSIS — Z01419 Encounter for gynecological examination (general) (routine) without abnormal findings: Secondary | ICD-10-CM

## 2013-03-16 DIAGNOSIS — D219 Benign neoplasm of connective and other soft tissue, unspecified: Secondary | ICD-10-CM

## 2013-03-16 DIAGNOSIS — Z1151 Encounter for screening for human papillomavirus (HPV): Secondary | ICD-10-CM | POA: Insufficient documentation

## 2013-03-16 DIAGNOSIS — H109 Unspecified conjunctivitis: Secondary | ICD-10-CM | POA: Diagnosis not present

## 2013-03-16 DIAGNOSIS — H04129 Dry eye syndrome of unspecified lacrimal gland: Secondary | ICD-10-CM | POA: Diagnosis not present

## 2013-03-16 DIAGNOSIS — Z124 Encounter for screening for malignant neoplasm of cervix: Secondary | ICD-10-CM

## 2013-03-16 DIAGNOSIS — K649 Unspecified hemorrhoids: Secondary | ICD-10-CM

## 2013-03-16 DIAGNOSIS — H02209 Unspecified lagophthalmos unspecified eye, unspecified eyelid: Secondary | ICD-10-CM | POA: Diagnosis not present

## 2013-03-16 MED ORDER — IBUPROFEN 800 MG PO TABS: 800.0000 mg | ORAL_TABLET | Freq: Three times a day (TID) | ORAL | Status: DC | PRN

## 2013-03-16 NOTE — Progress Notes (Signed)
Patient ID: [name], female   DOB: [birth year], 47 y.o.   MRN: [id number] History of Present Illness: [name] is a 47 year ol black female in for a pap and physical.She has a red swollen painful eye and has appt with eye MD this pm.Has had hemorrhoid banding by Dr Darrick Penna and has to go back Wednesday.   Current Medications, Allergies, Past Medical History, Past Surgical History, Family History and Social History were reviewed in [employer] record.     Review of Systems: Patient denies any headaches, blurred vision, shortness of breath, chest pain, abdominal pain, problems with bowel movements, urination, or intercourse. No joint pain or mood swings.    Physical Exam:BP 124/76  Pulse 74  Ht 5\' 6"  (1.676 m)  Wt 174 lb (78.926 kg)  BMI 28.10 kg/m2 General:  Well developed, well nourished, no acute distress Skin:  Warm and dry Neck:  Midline trachea, normal thyroid Lungs; Clear to auscultation bilaterally Breast:  No dominant palpable mass, retraction, or nipple discharge Cardiovascular: Regular rate and rhythm Abdomen:  Soft, non tender, no hepatosplenomegaly Pelvic:  External genitalia is normal in appearance.  The vagina is normal in appearance.  The cervix is bulbous.Pap with HPV performed  Uterus is felt to be enlarged in size, shape, and contour.  No                adnexal masses or tenderness noted. Rectal:deferred due to recent hemorrhoid banding Extremities:  No swelling or varicosities noted Psych:  No mood changes, alert and cooperative, seems happy   Impression: Yearly gyn exam Fibroids Hemorrhoids     Plan: Physical in 1 year Mammogram yearly  Colonoscopy per GI Rx Motrin 800 mg #60 1 every 8 hours prn wit 1 refill Follow up prn problems

## 2013-03-16 NOTE — Patient Instructions (Signed)
Physical in 1 year Mammogram yearly Colonoscopy as per GI 

## 2013-03-18 ENCOUNTER — Encounter: Payer: Self-pay | Admitting: Gastroenterology

## 2013-03-18 ENCOUNTER — Ambulatory Visit (INDEPENDENT_AMBULATORY_CARE_PROVIDER_SITE_OTHER): Payer: Medicare Other | Admitting: Gastroenterology

## 2013-03-18 ENCOUNTER — Encounter (INDEPENDENT_AMBULATORY_CARE_PROVIDER_SITE_OTHER): Payer: Self-pay

## 2013-03-18 VITALS — BP 125/73 | HR 101 | Temp 96.8°F | Ht 66.0 in | Wt 173.8 lb

## 2013-03-18 DIAGNOSIS — K648 Other hemorrhoids: Secondary | ICD-10-CM

## 2013-03-18 NOTE — Progress Notes (Addendum)
Patient ID: [name], female   DOB: 01-Mar-1966, 47 y.o.   MRN: [id number] SYMPTOMS: HAS RECTAL PAIN BUT NO RECTAL BLEEDING, PRESSURE,  ITCHING, BURNING. STILL HAVING TROUBLE WITH CONSTIPATION. UNABLE TO OBTAIN NTG OINTMENT. TROUBLE WITH R EYE INFECTION.  CONSTIPATION: NO DIARRHEA: NO  STRAINS WITH BMs: NO  TIME SPENT ON TOILET: 5 MINS TISSUE POKES OUT OF RECTUM: NO FIBER SUPPLEMENTS: YES GLASSES OF WATER/DAY: 6-8: trying    ADDITIONAL QUESTIONS:  LATEX ALLERGY: NO PREGNANT: NO ERECTILE DYSFUNCTION MEDS OR NITRATES: NO ANTICOAGULATION/ANTIPLATELET MEDS: NO DIAGNOSED WITH CROHN'S DISEASE, PROCTITIS, PORTAL HTN, OR ANAL/RECTAL CA: NO TAKING IMMUNOSUPPRESSANTS/XRT: NO   Plan  1. [facility] BANDING TODAY. 2. OPV APR 2015 E15 RECTAL PAIN/CONSTIPATION  PROCEDURE TECHNIQUE: BENEFITS RISK EXPLAINED TO PT. APPLIED SML AMOUNT NTG 0.125%/LIDOCAINE OINTMENT 5% TO ANAL CANAL WITH COTTON TIP APPLICATOR PRIOR TO CRH BANDING AND LIDOCAINE OINTMENT 5% WITH RECTAL EXAMS BEFORE AND AFTER DEVICE. ONE [facility] BAND PLACED IN LEFT LATERAL POSITION. POST-BANDING RECTAL EXAM REVEALED GOOD PLACEMENT. EXAM NON-TENDER.   Current Outpatient Prescriptions  Medication Sig Dispense Refill  . aspirin EC 81 MG tablet Take 325 mg by mouth daily.       Marland Kitchen atorvastatin (LIPITOR) 20 MG tablet Take 20 mg by mouth at bedtime.       . benazepril (LOTENSIN) 10 MG tablet Take 10 mg by mouth daily.       . fenofibrate (TRICOR) 145 MG tablet Take 145 mg by mouth daily.       Marland Kitchen gabapentin (NEURONTIN) 300 MG capsule Take 300 mg by mouth 3 (three) times daily.      . metFORMIN (GLUCOPHAGE) 1000 MG tablet Take 1,000 mg by mouth 2 (two) times daily with a meal.       . promethazine (PHENERGAN) 25 MG tablet Take 1 tablet (25 mg total) by mouth every 6 (six) hours as needed for nausea.  20 tablet  0  . ranitidine (ZANTAC) 150 MG tablet Take 150 mg by mouth 2 (two) times daily.      . benzonatate (TESSALON) 100 MG capsule Take 1  capsule (100 mg total) by mouth every 8 (eight) hours.  21 capsule  0  . Dextromethorphan-Guaifenesin (MUCINEX DM MAXIMUM STRENGTH) 60-1200 MG TB12 Take 1 tablet by mouth 2 (two) times daily.      . diclofenac (VOLTAREN) 75 MG EC tablet Take 75 mg by mouth 2 (two) times daily as needed for mild pain.       Marland Kitchen ibuprofen (ADVIL,MOTRIN) 800 MG tablet Take 1 tablet (800 mg total) by mouth every 8 (eight) hours as needed.  60 tablet  1

## 2013-03-18 NOTE — Progress Notes (Signed)
Reminder in epic °

## 2013-03-18 NOTE — Assessment & Plan Note (Signed)
SX IMPROVED BUT NOT COMPLETELY RESOLVED MOST LIKELY DUE TO CHRONIC ANAL FISSURE. PT CAN'T AFFORD NTG OINTMENT.  USE CREAM FOR RECTAL PAIN. FOLLOW A HIGH FIBER DIET. AVOID ITEMS THAT CAUSE BLOATING AND GAS.  DRINK WATER TO KEEP HER URINE LIGHT YELLOW.  USE FIBER POWDER OR 1 PACKET THREE TIMES A DAY. AVOID HIGHER DOSES IF IT CAUSES BLOATING & GAS.  CONTINUE SIT FOR LESS THAN 5 MINUTES ON THE COMMODE.  FOLLOW UP IN MAR 2015.

## 2013-03-18 NOTE — Patient Instructions (Addendum)
USE CREAM FOR RECTAL PAIN. TO COMPLETELY RELIVE YOUR RECTAL PAIN, YOU NEED THE RECTIV.   FOLLOW A HIGH FIBER DIET. AVOID ITEMS THAT CAUSE BLOATING AND GAS.   DRINK WATER TO KEEP HER URINE LIGHT YELLOW.   USE FIBER POWDER OR 1 PACKET THREE TIMES A DAY. AVOID HIGHER DOSES IF IT CAUSES BLOATING & GAS.   CONTINUE SIT FOR LESS THAN 5 MINUTES ON THE COMMODE.   FOLLOW UP IN MAR 2015.

## 2013-03-18 NOTE — Assessment & Plan Note (Signed)
EXPLAINED TO PT RECTAL PAIN WILL CONTINUE LONGER IF SHE DOES NOT OBTAIN NTG OINTMENT.  ENCOURAGED PT TO TRY TO OBTAIN RECTIV. OPV MAR 2015

## 2013-03-21 DIAGNOSIS — H1089 Other conjunctivitis: Secondary | ICD-10-CM | POA: Diagnosis not present

## 2013-03-21 DIAGNOSIS — H5710 Ocular pain, unspecified eye: Secondary | ICD-10-CM | POA: Diagnosis not present

## 2013-03-21 DIAGNOSIS — H5789 Other specified disorders of eye and adnexa: Secondary | ICD-10-CM | POA: Diagnosis not present

## 2013-03-21 DIAGNOSIS — A499 Bacterial infection, unspecified: Secondary | ICD-10-CM | POA: Diagnosis not present

## 2013-03-23 DIAGNOSIS — Z0000 Encounter for general adult medical examination without abnormal findings: Secondary | ICD-10-CM | POA: Diagnosis not present

## 2013-03-23 DIAGNOSIS — I10 Essential (primary) hypertension: Secondary | ICD-10-CM | POA: Diagnosis not present

## 2013-03-24 DIAGNOSIS — Z23 Encounter for immunization: Secondary | ICD-10-CM | POA: Diagnosis not present

## 2013-03-31 DIAGNOSIS — H04129 Dry eye syndrome of unspecified lacrimal gland: Secondary | ICD-10-CM | POA: Diagnosis not present

## 2013-03-31 DIAGNOSIS — H109 Unspecified conjunctivitis: Secondary | ICD-10-CM | POA: Diagnosis not present

## 2013-03-31 DIAGNOSIS — H01009 Unspecified blepharitis unspecified eye, unspecified eyelid: Secondary | ICD-10-CM | POA: Diagnosis not present

## 2013-04-20 DIAGNOSIS — N760 Acute vaginitis: Secondary | ICD-10-CM | POA: Diagnosis not present

## 2013-04-20 DIAGNOSIS — R300 Dysuria: Secondary | ICD-10-CM | POA: Diagnosis not present

## 2013-05-25 DIAGNOSIS — I10 Essential (primary) hypertension: Secondary | ICD-10-CM | POA: Diagnosis not present

## 2013-05-27 DIAGNOSIS — IMO0001 Reserved for inherently not codable concepts without codable children: Secondary | ICD-10-CM | POA: Diagnosis not present

## 2013-05-27 DIAGNOSIS — G932 Benign intracranial hypertension: Secondary | ICD-10-CM | POA: Diagnosis not present

## 2013-05-27 DIAGNOSIS — E782 Mixed hyperlipidemia: Secondary | ICD-10-CM | POA: Diagnosis not present

## 2013-05-28 ENCOUNTER — Emergency Department (HOSPITAL_COMMUNITY)
Admission: EM | Admit: 2013-05-28 | Discharge: 2013-05-28 | Discharge status: Against Medical Advice | Payer: Medicare Other | Attending: Emergency Medicine | Admitting: Emergency Medicine

## 2013-05-28 ENCOUNTER — Encounter (HOSPITAL_COMMUNITY): Payer: Self-pay | Admitting: Emergency Medicine

## 2013-05-28 DIAGNOSIS — Z87891 Personal history of nicotine dependence: Secondary | ICD-10-CM | POA: Insufficient documentation

## 2013-05-28 DIAGNOSIS — R0602 Shortness of breath: Secondary | ICD-10-CM | POA: Diagnosis not present

## 2013-05-28 DIAGNOSIS — R5381 Other malaise: Secondary | ICD-10-CM | POA: Diagnosis not present

## 2013-05-28 DIAGNOSIS — I10 Essential (primary) hypertension: Secondary | ICD-10-CM | POA: Diagnosis not present

## 2013-05-28 DIAGNOSIS — R52 Pain, unspecified: Secondary | ICD-10-CM | POA: Diagnosis not present

## 2013-05-28 DIAGNOSIS — E119 Type 2 diabetes mellitus without complications: Secondary | ICD-10-CM | POA: Insufficient documentation

## 2013-05-28 DIAGNOSIS — J45909 Unspecified asthma, uncomplicated: Secondary | ICD-10-CM | POA: Diagnosis not present

## 2013-05-28 DIAGNOSIS — R5383 Other fatigue: Secondary | ICD-10-CM | POA: Diagnosis not present

## 2013-05-28 NOTE — ED Notes (Signed)
Pt reports generalized body aches and generalized weakness since early this morning. Pt also reports intermittent SOB.

## 2013-06-15 ENCOUNTER — Ambulatory Visit (INDEPENDENT_AMBULATORY_CARE_PROVIDER_SITE_OTHER): Payer: Medicare Other | Admitting: Adult Health

## 2013-06-15 ENCOUNTER — Encounter: Payer: Self-pay | Admitting: Adult Health

## 2013-06-15 ENCOUNTER — Encounter (INDEPENDENT_AMBULATORY_CARE_PROVIDER_SITE_OTHER): Payer: Self-pay

## 2013-06-15 VITALS — BP 120/80 | Ht 66.0 in | Wt 173.0 lb

## 2013-06-15 DIAGNOSIS — N926 Irregular menstruation, unspecified: Secondary | ICD-10-CM | POA: Diagnosis not present

## 2013-06-15 DIAGNOSIS — N760 Acute vaginitis: Secondary | ICD-10-CM | POA: Diagnosis not present

## 2013-06-15 DIAGNOSIS — N939 Abnormal uterine and vaginal bleeding, unspecified: Secondary | ICD-10-CM

## 2013-06-15 DIAGNOSIS — Z113 Encounter for screening for infections with a predominantly sexual mode of transmission: Secondary | ICD-10-CM

## 2013-06-15 NOTE — Patient Instructions (Signed)
Keep period calendar Call in am

## 2013-06-15 NOTE — Progress Notes (Signed)
Subjective:     Patient ID: [name], female   DOB: Dec 10, 1965, 48 y.o.   MRN: [id number]  HPI [name] is a 48 year old black female sp ablation, LMP 12/22/12 and started bleeding yesterday kinda heavy with cramps, has had sex with condoms, said she had vaginal irritation about 2 weeks ago and used OTC monistat.Has history of fibroids.  Review of Systems See HPI Reviewed past medical,surgical, social and family history. Reviewed medications and allergies.     Objective:   Physical Exam BP 120/80  Ht 5\' 6"  (1.676 m)  Wt 173 lb (78.472 kg)  BMI 27.94 kg/m2  LMP 06/14/2013   Skin warm and dry.Pelvic: external genitalia is normal in appearance, vagina: period like blood without odor, cervix:smooth and bulbous, uterus: enlarged,mildly tender, adnexa: no masses or tenderness noted.  GC/CHL obtained. Discussed that need to check for STD.  Assessment:     AUB   STD screening History of vaginal irritation  Plan:     Check GC/CHL will follow up in am for results Keep period calendar

## 2013-06-16 ENCOUNTER — Telehealth: Payer: Self-pay | Admitting: Adult Health

## 2013-06-16 LAB — GC/CHLAMYDIA PROBE AMP
CT PROBE, AMP APTIMA: NEGATIVE
GC PROBE AMP APTIMA: NEGATIVE

## 2013-06-16 NOTE — Telephone Encounter (Signed)
Pt aware labs negative.  

## 2013-06-19 DIAGNOSIS — K219 Gastro-esophageal reflux disease without esophagitis: Secondary | ICD-10-CM | POA: Diagnosis not present

## 2013-06-21 DIAGNOSIS — R112 Nausea with vomiting, unspecified: Secondary | ICD-10-CM | POA: Diagnosis not present

## 2013-06-21 DIAGNOSIS — Z8249 Family history of ischemic heart disease and other diseases of the circulatory system: Secondary | ICD-10-CM | POA: Diagnosis not present

## 2013-06-21 DIAGNOSIS — K449 Diaphragmatic hernia without obstruction or gangrene: Secondary | ICD-10-CM | POA: Diagnosis not present

## 2013-06-21 DIAGNOSIS — Z79899 Other long term (current) drug therapy: Secondary | ICD-10-CM | POA: Diagnosis not present

## 2013-06-21 DIAGNOSIS — K5289 Other specified noninfective gastroenteritis and colitis: Secondary | ICD-10-CM | POA: Diagnosis not present

## 2013-06-21 DIAGNOSIS — J984 Other disorders of lung: Secondary | ICD-10-CM | POA: Diagnosis not present

## 2013-06-21 DIAGNOSIS — I10 Essential (primary) hypertension: Secondary | ICD-10-CM | POA: Diagnosis not present

## 2013-06-21 DIAGNOSIS — E119 Type 2 diabetes mellitus without complications: Secondary | ICD-10-CM | POA: Diagnosis not present

## 2013-06-21 DIAGNOSIS — E785 Hyperlipidemia, unspecified: Secondary | ICD-10-CM | POA: Diagnosis not present

## 2013-06-21 DIAGNOSIS — Z7982 Long term (current) use of aspirin: Secondary | ICD-10-CM | POA: Diagnosis not present

## 2013-06-21 DIAGNOSIS — D869 Sarcoidosis, unspecified: Secondary | ICD-10-CM | POA: Diagnosis not present

## 2013-06-22 DIAGNOSIS — K5289 Other specified noninfective gastroenteritis and colitis: Secondary | ICD-10-CM | POA: Diagnosis not present

## 2013-06-22 DIAGNOSIS — I10 Essential (primary) hypertension: Secondary | ICD-10-CM | POA: Diagnosis not present

## 2013-06-22 DIAGNOSIS — E119 Type 2 diabetes mellitus without complications: Secondary | ICD-10-CM | POA: Diagnosis not present

## 2013-06-22 DIAGNOSIS — K449 Diaphragmatic hernia without obstruction or gangrene: Secondary | ICD-10-CM | POA: Diagnosis not present

## 2013-06-22 DIAGNOSIS — E785 Hyperlipidemia, unspecified: Secondary | ICD-10-CM | POA: Diagnosis not present

## 2013-06-22 DIAGNOSIS — D869 Sarcoidosis, unspecified: Secondary | ICD-10-CM | POA: Diagnosis not present

## 2013-06-22 DIAGNOSIS — R112 Nausea with vomiting, unspecified: Secondary | ICD-10-CM | POA: Diagnosis not present

## 2013-06-23 DIAGNOSIS — D869 Sarcoidosis, unspecified: Secondary | ICD-10-CM | POA: Diagnosis not present

## 2013-06-23 DIAGNOSIS — K219 Gastro-esophageal reflux disease without esophagitis: Secondary | ICD-10-CM | POA: Diagnosis not present

## 2013-06-29 DIAGNOSIS — IMO0001 Reserved for inherently not codable concepts without codable children: Secondary | ICD-10-CM | POA: Diagnosis not present

## 2013-06-29 DIAGNOSIS — E782 Mixed hyperlipidemia: Secondary | ICD-10-CM | POA: Diagnosis not present

## 2013-07-06 DIAGNOSIS — H04129 Dry eye syndrome of unspecified lacrimal gland: Secondary | ICD-10-CM | POA: Diagnosis not present

## 2013-07-06 DIAGNOSIS — D869 Sarcoidosis, unspecified: Secondary | ICD-10-CM | POA: Diagnosis not present

## 2013-07-16 DIAGNOSIS — D869 Sarcoidosis, unspecified: Secondary | ICD-10-CM | POA: Diagnosis not present

## 2013-07-16 DIAGNOSIS — R0602 Shortness of breath: Secondary | ICD-10-CM | POA: Diagnosis not present

## 2013-07-27 ENCOUNTER — Telehealth: Payer: Self-pay

## 2013-07-27 NOTE — Telephone Encounter (Signed)
Pt is calling because she is having rectal bleeding and she said that the ER will not do anything for her. Her stomach is swallowing with some abd pain. Please advise [phone]

## 2013-07-28 NOTE — Telephone Encounter (Signed)
Pt is out of town now and will be back on Friday. She will call Friday to touch base with [place]. She said that the bleeding has stop for now. She had a hemorrhoid banding done in Dec 2014. She did not think she would have anymore rectal bleed after the banding.

## 2013-07-28 NOTE — Telephone Encounter (Signed)
Needs OV.  

## 2013-07-29 ENCOUNTER — Ambulatory Visit: Payer: Medicare Other | Admitting: Gastroenterology

## 2013-07-29 NOTE — Telephone Encounter (Signed)
Noted  

## 2013-07-31 NOTE — Telephone Encounter (Signed)
PLEASE CALL PT. HER LAST TCS WAS 4 YRS AGO. SHE NEEDS TO HAVE A TCS TO EVALUATE HER RECTAL BLEEDING. IF SHE STILL HAS HEMORRHOIDS, THEN SHE MAY HAVE ADDITIONAL BANDING  AT THAT TIME.

## 2013-08-03 NOTE — Telephone Encounter (Signed)
Route to DS 

## 2013-08-03 NOTE — Telephone Encounter (Signed)
TRIAGE PT FOR TCS.

## 2013-08-03 NOTE — Telephone Encounter (Signed)
Do you want her to come in a urgent spot with you or wait until June for an appointment or do you want [place] to update everything over the phone. Please advise

## 2013-08-10 NOTE — Telephone Encounter (Signed)
LMOM to call.

## 2013-08-11 ENCOUNTER — Telehealth: Payer: Self-pay

## 2013-08-11 NOTE — Telephone Encounter (Signed)
MOVI PREP SPLIT DOSING, FULL LIQUID WITH BREAKFAST.   CONTINUE GLUCOPHAGE.

## 2013-08-11 NOTE — Telephone Encounter (Signed)
Gastroenterology Pre-Procedure Review  Request Date: 08/11/2013 Requesting Physician: Dr. Oneida Alar  PATIENT REVIEW QUESTIONS: The patient responded to the following health history questions as indicated:    PT SCHEDULED FOR COLONOSCOPY PER DR. FIELDS FOR RECTAL BLEED  1. Diabetes Melitis: YES 2. Joint replacements in the past 12 months: no 3. Major health problems in the past 3 months: no 4. Has an artificial valve or MVP: no 5. Has a defibrillator: no 6. Has been advised in past to take antibiotics in advance of a procedure like teeth cleaning: no    MEDICATIONS & ALLERGIES:    Patient reports the following regarding taking any blood thinners:   Plavix? no Aspirin? YES Coumadin? no   Patient confirms/reports the following medications:  Current Outpatient Prescriptions  Medication Sig Dispense Refill  . aspirin EC 81 MG tablet Take 325 mg by mouth daily.       Marland Kitchen atorvastatin (LIPITOR) 20 MG tablet Take 20 mg by mouth at bedtime.       . benazepril (LOTENSIN) 10 MG tablet Take 10 mg by mouth daily.       . benzonatate (TESSALON) 100 MG capsule Take 1 capsule (100 mg total) by mouth every 8 (eight) hours.  21 capsule  0  . Dextromethorphan-Guaifenesin (MUCINEX DM MAXIMUM STRENGTH) 60-1200 MG TB12 Take 1 tablet by mouth 2 (two) times daily.      . diclofenac (VOLTAREN) 75 MG EC tablet Take 75 mg by mouth 2 (two) times daily as needed for mild pain.       . fenofibrate (TRICOR) 145 MG tablet Take 145 mg by mouth daily.       Marland Kitchen gabapentin (NEURONTIN) 300 MG capsule Take 300 mg by mouth 3 (three) times daily.      Marland Kitchen ibuprofen (ADVIL,MOTRIN) 800 MG tablet Take 1 tablet (800 mg total) by mouth every 8 (eight) hours as needed.  60 tablet  1  . metFORMIN (GLUCOPHAGE) 1000 MG tablet Take 1,000 mg by mouth 2 (two) times daily with a meal.       . promethazine (PHENERGAN) 25 MG tablet Take 1 tablet (25 mg total) by mouth every 6 (six) hours as needed for nausea.  20 tablet  0  . ranitidine  (ZANTAC) 150 MG tablet Take 150 mg by mouth 2 (two) times daily.       No current facility-administered medications for this visit.    Patient confirms/reports the following allergies:  Allergies  Allergen Reactions  . Shellfish Allergy Anaphylaxis    No orders of the defined types were placed in this encounter.    AUTHORIZATION INFORMATION Primary Alene Mires,  ID #:   Group #:  Pre-Cert / Auth required:  Pre-Cert / Auth #:   Secondary Insurance:   ID #:   Group #:  Pre-Cert / Auth required:  Pre-Cert / Auth #:   SCHEDULE INFORMATION: Procedure has been scheduled as follows:  Date: 08/20/2013                   Time: 8:30 AM  Location: [facility] Short Stay  This Gastroenterology Pre-Precedure Review Form is being routed to the following provider(s): Barney Drain, MD

## 2013-08-12 ENCOUNTER — Other Ambulatory Visit: Payer: Self-pay

## 2013-08-12 ENCOUNTER — Encounter (HOSPITAL_COMMUNITY): Payer: Self-pay

## 2013-08-12 DIAGNOSIS — Z1211 Encounter for screening for malignant neoplasm of colon: Secondary | ICD-10-CM

## 2013-08-12 MED ORDER — PEG-KCL-NACL-NASULF-NA ASC-C 100 G PO SOLR: 1.0000 | ORAL | Status: DC

## 2013-08-12 NOTE — Telephone Encounter (Signed)
Rx was sent to the pharmacy and instructions were mailed to pt at [place] [place] per her request instead of the [address] Box.

## 2013-08-17 MED ORDER — PEG-KCL-NACL-NASULF-NA ASC-C 100 G PO SOLR: 1.0000 | ORAL | Status: DC

## 2013-08-17 NOTE — Telephone Encounter (Signed)
See separate triage.  

## 2013-08-17 NOTE — Addendum Note (Signed)
Addended by: Everardo All on: 08/17/2013 12:28 PM   Modules accepted: Orders

## 2013-08-20 ENCOUNTER — Encounter (HOSPITAL_COMMUNITY): Payer: Self-pay | Admitting: *Deleted

## 2013-08-20 ENCOUNTER — Encounter (HOSPITAL_COMMUNITY)
Admission: RE | Disposition: A | Discharge status: Home / Self Care | Payer: Self-pay | Source: Ambulatory Visit | Attending: Gastroenterology

## 2013-08-20 ENCOUNTER — Ambulatory Visit (HOSPITAL_COMMUNITY)
Admission: RE | Admit: 2013-08-20 | Discharge: 2013-08-20 | Disposition: A | Discharge status: Home / Self Care | Payer: Medicare Other | Source: Ambulatory Visit | Attending: Gastroenterology | Admitting: Gastroenterology

## 2013-08-20 DIAGNOSIS — E119 Type 2 diabetes mellitus without complications: Secondary | ICD-10-CM | POA: Insufficient documentation

## 2013-08-20 DIAGNOSIS — Z87891 Personal history of nicotine dependence: Secondary | ICD-10-CM | POA: Insufficient documentation

## 2013-08-20 DIAGNOSIS — K648 Other hemorrhoids: Secondary | ICD-10-CM

## 2013-08-20 DIAGNOSIS — J45909 Unspecified asthma, uncomplicated: Secondary | ICD-10-CM | POA: Diagnosis not present

## 2013-08-20 DIAGNOSIS — K5730 Diverticulosis of large intestine without perforation or abscess without bleeding: Secondary | ICD-10-CM | POA: Insufficient documentation

## 2013-08-20 DIAGNOSIS — K5900 Constipation, unspecified: Secondary | ICD-10-CM | POA: Diagnosis not present

## 2013-08-20 DIAGNOSIS — D869 Sarcoidosis, unspecified: Secondary | ICD-10-CM | POA: Diagnosis not present

## 2013-08-20 DIAGNOSIS — Z7982 Long term (current) use of aspirin: Secondary | ICD-10-CM | POA: Diagnosis not present

## 2013-08-20 DIAGNOSIS — K6289 Other specified diseases of anus and rectum: Secondary | ICD-10-CM | POA: Diagnosis not present

## 2013-08-20 DIAGNOSIS — Z1211 Encounter for screening for malignant neoplasm of colon: Secondary | ICD-10-CM

## 2013-08-20 DIAGNOSIS — K625 Hemorrhage of anus and rectum: Secondary | ICD-10-CM | POA: Diagnosis not present

## 2013-08-20 DIAGNOSIS — K649 Unspecified hemorrhoids: Secondary | ICD-10-CM | POA: Insufficient documentation

## 2013-08-20 DIAGNOSIS — I10 Essential (primary) hypertension: Secondary | ICD-10-CM | POA: Insufficient documentation

## 2013-08-20 DIAGNOSIS — Z79899 Other long term (current) drug therapy: Secondary | ICD-10-CM | POA: Insufficient documentation

## 2013-08-20 DIAGNOSIS — Z91013 Allergy to seafood: Secondary | ICD-10-CM | POA: Diagnosis not present

## 2013-08-20 HISTORY — PX: HEMORRHOID BANDING: SHX5850

## 2013-08-20 HISTORY — PX: COLONOSCOPY: SHX5424

## 2013-08-20 LAB — GLUCOSE, CAPILLARY: Glucose-Capillary: 80 mg/dL (ref 70–99)

## 2013-08-20 SURGERY — COLONOSCOPY
Anesthesia: Moderate Sedation

## 2013-08-20 MED ORDER — SODIUM CHLORIDE 0.9 % IV SOLN
INTRAVENOUS | Status: DC
  Administered 2013-08-20: 1000 mL via INTRAVENOUS

## 2013-08-20 MED ORDER — MIDAZOLAM HCL 5 MG/5ML IJ SOLN
INTRAMUSCULAR | Status: AC
  Filled 2013-08-20: qty 10

## 2013-08-20 MED ORDER — MIDAZOLAM HCL 5 MG/5ML IJ SOLN
INTRAMUSCULAR | Status: DC | PRN
  Administered 2013-08-20: 1 mg via INTRAVENOUS
  Administered 2013-08-20: 2 mg via INTRAVENOUS

## 2013-08-20 MED ORDER — MEPERIDINE HCL 100 MG/ML IJ SOLN
INTRAMUSCULAR | Status: AC
  Filled 2013-08-20: qty 2

## 2013-08-20 MED ORDER — MEPERIDINE HCL 100 MG/ML IJ SOLN
INTRAMUSCULAR | Status: DC | PRN
  Administered 2013-08-20: 25 mg via INTRAVENOUS
  Administered 2013-08-20: 50 mg via INTRAVENOUS

## 2013-08-20 MED ORDER — SIMETHICONE 40 MG/0.6ML PO SUSP
ORAL | Status: DC | PRN
  Administered 2013-08-20: 09:00:00

## 2013-08-20 NOTE — H&P (Signed)
Primary Care Physician:  [name], MD Primary Gastroenterologist:  Dr. Oneida Alar  Pre-Procedure History & Physical: HPI:  [name] is a 48 y.o. female here for BRBPR/rectal pressure.  Past Medical History  Diagnosis Date  . Sarcoidosis   . Diabetes mellitus   . Hypertension   . Asthma   . Abnormal uterine bleeding (AUB) 11/26/2012  . BV (bacterial vaginosis) 11/26/2012  . Fatigue 12/24/2012  . Other and unspecified ovarian cyst 01/01/2013  . Fibroids 01/01/2013  . Hemorrhoid 01/01/2013  . Vaginal discharge 02/12/2013    +yeast  . Constipation     Past Surgical History  Procedure Laterality Date  . Tubal ligation    . Endometrial ablation    . Cholecystectomy    . Hemorrhoid surgery      banding    Prior to Admission medications   Medication Sig Start Date End Date Taking? Authorizing Provider  aspirin EC 325 MG tablet Take 325 mg by mouth daily.   Yes Historical Provider, MD  atorvastatin (LIPITOR) 20 MG tablet Take 20 mg by mouth at bedtime.    Yes Historical Provider, MD  benazepril (LOTENSIN) 10 MG tablet Take 10 mg by mouth daily.    Yes Historical Provider, MD  fenofibrate (TRICOR) 145 MG tablet Take 145 mg by mouth at bedtime.    Yes Historical Provider, MD  gabapentin (NEURONTIN) 300 MG capsule Take 300 mg by mouth 3 (three) times daily as needed (pain).    Yes Historical Provider, MD  metFORMIN (GLUCOPHAGE) 1000 MG tablet Take 1,000 mg by mouth 2 (two) times daily with a meal.    Yes Historical Provider, MD  peg 3350 powder (MOVIPREP) 100 G SOLR Take 1 kit (200 g total) by mouth as directed. 08/17/13  Yes Danie Binder, MD  ranitidine (ZANTAC) 150 MG tablet Take 150 mg by mouth 2 (two) times daily.   Yes Historical Provider, MD  diclofenac (VOLTAREN) 75 MG EC tablet Take 75 mg by mouth 2 (two) times daily as needed for mild pain.     Historical Provider, MD  ibuprofen (ADVIL,MOTRIN) 800 MG tablet Take 800 mg by mouth 3 (three) times daily as needed for mild  pain.    Historical Provider, MD  promethazine (PHENERGAN) 25 MG tablet Take 1 tablet (25 mg total) by mouth every 6 (six) hours as needed for nausea. 01/03/13   Nat Christen, MD    Allergies as of 08/12/2013 - Review Complete 08/12/2013  Allergen Reaction Noted  . Shellfish allergy Anaphylaxis 12/15/2011    Family History  Problem Relation Age of Onset  . Diabetes Father   . Cancer Mother     throat  . Cancer Brother     lung  . Colon cancer Neg Hx   . Breast cancer Cousin     History   Social History  . Marital Status: Single    Spouse Name: N/A    Number of Children: 2  . Years of Education: N/A   Occupational History  .     Social History Main Topics  . Smoking status: Former Smoker    Types: Cigarettes  . Smokeless tobacco: Never Used     Comment: Quit  5 years  . Alcohol Use: No  . Drug Use: No  . Sexual Activity: Not Currently    Birth Control/ Protection: None   Other Topics Concern  . Not on file   Social History Narrative  . No narrative on file    Review of  Systems: See HPI, otherwise negative ROS   Physical Exam: BP 125/74  Pulse 70  Temp(Src) 97.6 F (36.4 C) (Oral)  Resp 20  Ht 5' 6"  (1.676 m)  Wt 175 lb (79.379 kg)  BMI 28.26 kg/m2  SpO2 99%  LMP 07/15/2013 General:   Alert,  pleasant and cooperative in NAD Head:  Normocephalic and atraumatic. Neck:  Supple; Lungs:  Clear throughout to auscultation.    Heart:  Regular rate and rhythm. Abdomen:  Soft, nontender and nondistended. Normal bowel sounds, without guarding, and without rebound.   Neurologic:  Alert and  oriented x4;  grossly normal neurologically.  Impression/Plan:    BRBPR/rectal pressure  PLAN: TCS TODAY

## 2013-08-20 NOTE — Discharge Instructions (Signed)
You have internal hemorrhoids. YOU DID NOT HAVE ANY POLYPS. I PLACED 3 BANDS TO TREAT YOUR HEMORRHOIDAL BLEEDING. YOU MAY SOME MILD BLEEDING OVER THE NEXT 3 TO 5 DAYS.   CALL [phone] IF YOU HAVE A FEVER, A LARGE AMOUNT OF BLEEDING, OR DIFFICULTY URINATING.  DRINK WATER TO KEEP URINE LIGHT YELLOW. YOU MAY USE NAPROXEN TWICE DAILY FOR RECTAL DISCOMFORT. TYLENOL AS NEED FOR ADDITIONAL PAIN RELIEF. IF NEEDED, USE COLACE TWICE DAILY TO SOFTEN STOOL.  FOLLOW A LOW RESIDUE DIET FOR THE NEXT 2 WEEKS. SEE INFO BELOW. FOLLOW UP IN 3-4 WEEKS.  Next colonoscopy in 10 years.    Colonoscopy Care After Read the instructions outlined below and refer to this sheet in the next week. These discharge instructions provide you with general information on caring for yourself after you leave the hospital. While your treatment has been planned according to the most current medical practices available, unavoidable complications occasionally occur. If you have any problems or questions after discharge, call DR. Gedalia Mcmillon, [phone].  ACTIVITY  You may resume your regular activity, but move at a slower pace for the next 24 hours.   Take frequent rest periods for the next 24 hours.   Walking will help get rid of the air and reduce the bloated feeling in your belly (abdomen).   No driving for 24 hours (because of the medicine (anesthesia) used during the test).   You may shower.   Do not sign any important legal documents or operate any machinery for 24 hours (because of the anesthesia used during the test).    NUTRITION  Drink plenty of fluids.   You may resume your normal diet as instructed by your doctor.   Begin with a light meal and progress to your normal diet. Heavy or fried foods are harder to digest and may make you feel sick to your stomach (nauseated).   Avoid alcoholic beverages for 24 hours or as instructed.    MEDICATIONS  You may resume your normal medications.   WHAT YOU CAN  EXPECT TODAY  Some feelings of bloating in the abdomen.   Passage of more gas than usual.   Spotting of blood in your stool or on the toilet paper  .  IF YOU HAD POLYPS REMOVED DURING THE COLONOSCOPY:  Eat a soft diet IF YOU HAVE NAUSEA, BLOATING, ABDOMINAL PAIN, OR VOMITING.    FINDING OUT THE RESULTS OF YOUR TEST Not all test results are available during your visit. DR. Oneida Alar WILL CALL YOU WITHIN 7 DAYS OF YOUR PROCEDUE WITH YOUR RESULTS. Do not assume everything is normal if you have not heard from DR. Burley K[place] IN ONE WEEK, CALL HER OFFICE AT [phone].  SEEK IMMEDIATE MEDICAL ATTENTION AND CALL THE OFFICE: [phone] IF:  You have more than a spotting of blood in your stool.   Your belly is swollen (abdominal distention).   You are nauseated or vomiting.   You have a temperature over 101F.   You have abdominal pain or discomfort that is severe or gets worse throughout the day.   Hemorrhoids Hemorrhoids are dilated (enlarged) veins around the rectum. Sometimes clots will form in the veins. This makes them swollen and painful. These are called thrombosed hemorrhoids. Causes of hemorrhoids include:  Constipation.   Straining to have a bowel movement.   HEAVY LIFTING HOME CARE INSTRUCTIONS  Eat a well balanced diet and drink 6 to 8 glasses of water every day to avoid constipation. You may also use a bulk laxative.  Avoid straining to have bowel movements.   Keep anal area dry and clean.   Do not use a donut shaped pillow or sit on the toilet for long periods. This increases blood pooling and pain.   Move your bowels when your body has the urge; this will require less straining and will decrease pain and pressure.  LOW RESIDUE DIET  A low-residue diet, aka low-fiber diet, is usually recommended. An intake of less than 10 grams of fiber per day is generally considered a low-residue diet.  LOW RESIDUE Diet  Grain Products: enriched refined white bread,  buns, bagels, English muffins  plain cereals e.g. Cheerios, Cornflakes, Cream of Wheat, Rice Krispies, Special K  arrowroot cookies, tea biscuits, soda crackers, plain melba toast  white rice, refined pasta and noodles  avoid whole grains   Fruits: fruit juices except prune juice  applesauce, apricots, banana (1/2), cantaloupe, canned fruit cocktail, grapes, honeydew melon, peaches, watermelon  avoid raw and dried fruits, and berries.   Vegetables: vegetable juices  potatoes (no Skin)  alfalfa sprouts, beets, green/yellow beans, carrots, celery, cucumber, eggplant, lettuce, mushrooms, green/red peppers, potatoes (peeled), squash, zucchini  avoid vegetables from the cruciferous family such as broccoli, cauliflower, brussels sprouts, cabbage, kale, Swiss chard, onion, etc   Meat and Protein Choice: well-cooked, tender meat, fish and eggs  avoid beans  avoid all nuts and seeds, as well as foods that may contain seeds (such as yogurt)   Dairy: NO RESTRICTIONS   Drinks: juices  tea and coffee   avoid alcohol

## 2013-08-21 NOTE — Op Note (Signed)
[facility] [address] [place], [address]   COLONOSCOPY PROCEDURE REPORT  PATIENT: Erica, Dean  MR#: [id number] BIRTHDATE: 1965-11-26 , 42  yrs. old GENDER: Female ENDOSCOPIST: Barney Drain, MD REFERRED YS:AYTK Hasanaj, M.D. PROCEDURE DATE:  08/20/2013 PROCEDURE:   Colonoscopy, diagnostic and Hemorrhoidectomy via banding, clips or ligation INDICATIONS:Rectal Bleeding and rectal pain.  [place] BANDING X3 in Fall 2014 MEDICATIONS: Demerol 75 mg IV and Versed 3 mg IV  DESCRIPTION OF PROCEDURE:    Physical exam was performed.  Informed consent was obtained from the patient after explaining the benefits, risks, and alternatives to procedure.  The patient was connected to monitor and placed in left lateral position. Continuous oxygen was provided by nasal cannula and IV medicine administered through an indwelling cannula.  After administration of sedation and rectal exam, the patients rectum was intubated and the EC-[id number] ([id number]) and [id number] ([id number])  colonoscope was advanced under direct visualization to the ileum.  The scope was removed slowly by carefully examining the color, texture, anatomy, and integrity mucosa on the way out.  The patient was recovered in endoscopy and discharged home in satisfactory condition.    COLON FINDINGS: The mucosa appeared normal in the terminal ileum.  , Mild diverticulosis was noted in the ascending colon.  , Moderate sized hemorrhoids were found.  , and The colon mucosa was otherwise normal.  PREP QUALITY: good.  CECAL W/D TIME: 12 minutes     COMPLICATIONS: None  ENDOSCOPIC IMPRESSION: 1.   Normal mucosa in the terminal ileum 2.   Mild diverticulosis was noted in the ascending colon 3.   RECTAL BLEEDING/PAIN DUE TO Moderate sized hemorrhoids   RECOMMENDATIONS: CALL [phone] IF YOU HAVE A FEVER, A LARGE AMOUNT OF BLEEDING, OR DIFFICULTY URINATING.  DRINK WATER TO KEEP URINE LIGHT YELLOW. MAY USE NAPROXEN  TWICE DAILY FOR RECTAL DISCOMFORT.  TYLENOL AS NEED FOR ADDITIONAL PAIN RELIEF. IF NEEDED, USE COLACE TWICE DAILY TO SOFTEN STOOL. FOLLOW A LOW RESIDUE DIET FOR THE NEXT 2 WEEKS. FOLLOW UP IN 3-4 WEEKS.  Next colonoscopy in 10 years.       _______________________________ Lorrin MaisBarney Drain, MD 08/21/2013 5:10 PM

## 2013-08-26 DIAGNOSIS — M549 Dorsalgia, unspecified: Secondary | ICD-10-CM | POA: Diagnosis not present

## 2013-08-28 ENCOUNTER — Encounter (HOSPITAL_COMMUNITY): Payer: Self-pay | Admitting: Gastroenterology

## 2013-09-02 ENCOUNTER — Ambulatory Visit: Payer: Medicare Other | Admitting: Gastroenterology

## 2013-09-21 ENCOUNTER — Ambulatory Visit (INDEPENDENT_AMBULATORY_CARE_PROVIDER_SITE_OTHER): Payer: Medicare Other | Admitting: Gastroenterology

## 2013-09-21 ENCOUNTER — Encounter: Payer: Self-pay | Admitting: Gastroenterology

## 2013-09-21 ENCOUNTER — Encounter (INDEPENDENT_AMBULATORY_CARE_PROVIDER_SITE_OTHER): Payer: Self-pay

## 2013-09-21 VITALS — BP 98/60 | HR 100 | Temp 98.2°F | Resp 20 | Ht 66.0 in | Wt 169.0 lb

## 2013-09-21 DIAGNOSIS — K648 Other hemorrhoids: Secondary | ICD-10-CM | POA: Diagnosis not present

## 2013-09-21 NOTE — Patient Instructions (Signed)
You are doing great!  Please call if any recurrent bleeding, rectal pain.   Otherwise, we will have you return in about 6 months for a routine follow-up.

## 2013-09-21 NOTE — Progress Notes (Signed)
Referring Provider: [name], MD Primary Care Physician:  [name], MD Primary GI: Dr. Oneida Alar   Chief Complaint  Patient presents with  . Follow-up    HPI:   [name] presents today in follow-up after colonoscopy with hemorrhoid banding. Had one episode of low volume rectal bleeding after procedure. None since tha time. No rectal pain or discomfort. Mild rectal itching rarely but uses cream. No abdominal pain. No urinary symptoms. States when it is hot she has more frequent bowel movements. No reflux issues.  Past Medical History  Diagnosis Date  . Sarcoidosis   . Diabetes mellitus   . Hypertension   . Asthma   . Abnormal uterine bleeding (AUB) 11/26/2012  . BV (bacterial vaginosis) 11/26/2012  . Fatigue 12/24/2012  . Other and unspecified ovarian cyst 01/01/2013  . Fibroids 01/01/2013  . Hemorrhoid 01/01/2013  . Vaginal discharge 02/12/2013    +yeast  . Constipation     Past Surgical History  Procedure Laterality Date  . Tubal ligation    . Endometrial ablation    . Cholecystectomy    . Hemorrhoid surgery      banding  . Colonoscopy N/A 08/20/2013    Dr. Oneida Alar: normal mucosa in terminal ileum, mild diverticulosis in ascending colon, moderate sized hemorrhoids s/p banding  . Hemorrhoid banding  08/20/2013    Procedure: HEMORRHOID BANDING;  Surgeon: Danie Binder, MD;  Location: [facility];  Service: Endoscopy;;    Current Outpatient Prescriptions  Medication Sig Dispense Refill  . aspirin EC 325 MG tablet Take 325 mg by mouth daily.      Marland Kitchen atorvastatin (LIPITOR) 20 MG tablet Take 20 mg by mouth at bedtime.       . benazepril (LOTENSIN) 10 MG tablet Take 10 mg by mouth daily.       . diclofenac (VOLTAREN) 75 MG EC tablet Take 75 mg by mouth 2 (two) times daily as needed for mild pain.       . fenofibrate (TRICOR) 145 MG tablet Take 145 mg by mouth at bedtime.       . gabapentin (NEURONTIN) 300 MG capsule Take 300 mg by mouth 3 (three) times  daily as needed (pain).       Marland Kitchen ibuprofen (ADVIL,MOTRIN) 800 MG tablet Take 800 mg by mouth 3 (three) times daily as needed for mild pain.      . metFORMIN (GLUCOPHAGE) 1000 MG tablet Take 1,000 mg by mouth 2 (two) times daily with a meal.       . promethazine (PHENERGAN) 25 MG tablet Take 1 tablet (25 mg total) by mouth every 6 (six) hours as needed for nausea.  20 tablet  0  . ranitidine (ZANTAC) 150 MG tablet Take 150 mg by mouth 2 (two) times daily.       No current facility-administered medications for this visit.    Allergies as of 09/21/2013 - Review Complete 09/21/2013  Allergen Reaction Noted  . Shellfish allergy Anaphylaxis 12/15/2011    Family History  Problem Relation Age of Onset  . Diabetes Father   . Cancer Mother     throat  . Cancer Brother     lung  . Colon cancer Neg Hx   . Breast cancer Cousin     History   Social History  . Marital Status: Single    Spouse Name: N/A    Number of Children: 2  . Years of Education: N/A   Occupational History  .  Social History Main Topics  . Smoking status: Former Smoker    Types: Cigarettes  . Smokeless tobacco: Never Used     Comment: Quit  5 years  . Alcohol Use: No  . Drug Use: No  . Sexual Activity: Not Currently    Birth Control/ Protection: None   Other Topics Concern  . None   Social History Narrative  . None    Review of Systems: As mentioned in HPI  Physical Exam: BP 98/60  Pulse 100  Temp(Src) 98.2 F (36.8 C) (Oral)  Resp 20  Ht 5\' 6"  (1.676 m)  Wt 169 lb (76.658 kg)  BMI 27.29 kg/m2 General:   Alert and oriented. No distress noted. Pleasant and cooperative.  Abdomen:  +BS, soft, non-tender and non-distended. No rebound or guarding. No HSM or masses noted. Msk:  Symmetrical without gross deformities. Normal posture. Extremities:  Without edema. Neurologic:  Alert and  oriented x4;  grossly normal neurologically. Skin:  Intact without significant lesions or rashes. Psych:  Alert  and cooperative. Normal mood and affect.

## 2013-09-21 NOTE — Assessment & Plan Note (Signed)
With colonoscopy and banding May 2015. Symptomatically improved. No concerning lower or upper GI symptoms. Return in 6 months or sooner if needed. Discussed behavior modification for future.

## 2013-09-22 NOTE — Progress Notes (Signed)
cc'd to pcp 

## 2013-10-07 DIAGNOSIS — K219 Gastro-esophageal reflux disease without esophagitis: Secondary | ICD-10-CM | POA: Diagnosis not present

## 2013-10-07 DIAGNOSIS — E119 Type 2 diabetes mellitus without complications: Secondary | ICD-10-CM | POA: Diagnosis not present

## 2013-10-07 DIAGNOSIS — I2510 Atherosclerotic heart disease of native coronary artery without angina pectoris: Secondary | ICD-10-CM | POA: Diagnosis not present

## 2013-10-07 DIAGNOSIS — E785 Hyperlipidemia, unspecified: Secondary | ICD-10-CM | POA: Diagnosis not present

## 2013-10-07 DIAGNOSIS — D869 Sarcoidosis, unspecified: Secondary | ICD-10-CM | POA: Diagnosis not present

## 2013-10-07 DIAGNOSIS — R0602 Shortness of breath: Secondary | ICD-10-CM | POA: Diagnosis not present

## 2013-10-07 DIAGNOSIS — I10 Essential (primary) hypertension: Secondary | ICD-10-CM | POA: Diagnosis not present

## 2013-10-12 ENCOUNTER — Telehealth: Payer: Self-pay | Admitting: Gastroenterology

## 2013-10-12 MED ORDER — NITROGLYCERIN 0.4 % RE OINT: 1.0000 [in_us] | TOPICAL_OINTMENT | Freq: Two times a day (BID) | RECTAL | Status: DC

## 2013-10-12 NOTE — Telephone Encounter (Signed)
I called and informed pt. She said the stool is very dark black.

## 2013-10-12 NOTE — Telephone Encounter (Signed)
I called pt and she said she seen dark blood in her stool once yesterday and once today. (She said she has not had blood in her urine.) She said she does not have a BM everyday, more like once every other day or sometimes every 3 days. She said her stool is formed and soft , not hard. Please advise!

## 2013-10-12 NOTE — Telephone Encounter (Signed)
Is she having melena? Is it black and tarry?  Needs high fiber diet.  I'm sending in nitro ointment per rectum.

## 2013-10-12 NOTE — Telephone Encounter (Signed)
I attempted to call patient. Had to leave message on phone. We need to find out if she has taken anything that could have caused this (iron, pepto, etc).  Needs stat CBC. I left message on machine to present to the ED if persistent evidence of black, tarry stool. I don't see where she has had an EGD.  Need to follow-up with her first thing 7/14.

## 2013-10-12 NOTE — Telephone Encounter (Signed)
Pt said that she noticed yesterday that she was passing dark colored blood when she urinated and again today with a BM. She said that her BM isnt hard or hasn't been constipated. She is staying nausea and feels a pain "in her butt".  Please advise and call [phone]

## 2013-10-13 ENCOUNTER — Other Ambulatory Visit: Payer: Self-pay

## 2013-10-13 DIAGNOSIS — R195 Other fecal abnormalities: Secondary | ICD-10-CM | POA: Diagnosis not present

## 2013-10-13 DIAGNOSIS — K921 Melena: Secondary | ICD-10-CM

## 2013-10-13 LAB — CBC WITH DIFFERENTIAL/PLATELET
BASOS ABS: 0 10*3/uL (ref 0.0–0.1)
BASOS PCT: 0 % (ref 0–1)
EOS PCT: 0 % (ref 0–5)
Eosinophils Absolute: 0 10*3/uL (ref 0.0–0.7)
HCT: 35 % — ABNORMAL LOW (ref 36.0–46.0)
Hemoglobin: 12.2 g/dL (ref 12.0–15.0)
Lymphocytes Relative: 17 % (ref 12–46)
Lymphs Abs: 1.3 10*3/uL (ref 0.7–4.0)
MCH: 28.7 pg (ref 26.0–34.0)
MCHC: 34.9 g/dL (ref 30.0–36.0)
MCV: 82.4 fL (ref 78.0–100.0)
Monocytes Absolute: 0.6 10*3/uL (ref 0.1–1.0)
Monocytes Relative: 8 % (ref 3–12)
NEUTROS PCT: 75 % (ref 43–77)
Neutro Abs: 5.6 10*3/uL (ref 1.7–7.7)
PLATELETS: 302 10*3/uL (ref 150–400)
RBC: 4.25 MIL/uL (ref 3.87–5.11)
RDW: 12.8 % (ref 11.5–15.5)
WBC: 7.5 10*3/uL (ref 4.0–10.5)

## 2013-10-13 NOTE — Telephone Encounter (Signed)
I called pt. She is not taking iron or pepto. She will go for STAT CBC this morning. Order has been faxed to [facility].

## 2013-10-13 NOTE — Progress Notes (Signed)
Quick Note:  I called and informed pt. She has not had any more black stool. She will come by and pick up the iFOBT and do it and return it. It is at front for pick up. ______

## 2013-10-13 NOTE — Progress Notes (Signed)
Quick Note:  No anemia.  Let's check ifobt. Any further evidence of black stool? ______

## 2013-10-14 ENCOUNTER — Telehealth: Payer: Self-pay | Admitting: *Deleted

## 2013-10-14 NOTE — Telephone Encounter (Signed)
Labs are in [name]'s cart.

## 2013-10-16 ENCOUNTER — Ambulatory Visit (INDEPENDENT_AMBULATORY_CARE_PROVIDER_SITE_OTHER): Payer: Medicare Other

## 2013-10-16 DIAGNOSIS — Z1211 Encounter for screening for malignant neoplasm of colon: Secondary | ICD-10-CM

## 2013-10-19 ENCOUNTER — Encounter (HOSPITAL_COMMUNITY): Payer: Self-pay | Admitting: Emergency Medicine

## 2013-10-19 ENCOUNTER — Emergency Department (HOSPITAL_COMMUNITY)
Admission: EM | Admit: 2013-10-19 | Discharge: 2013-10-20 | Discharge status: Against Medical Advice | Payer: Medicare Other | Attending: Emergency Medicine | Admitting: Emergency Medicine

## 2013-10-19 DIAGNOSIS — K649 Unspecified hemorrhoids: Secondary | ICD-10-CM | POA: Diagnosis not present

## 2013-10-19 DIAGNOSIS — I10 Essential (primary) hypertension: Secondary | ICD-10-CM | POA: Diagnosis not present

## 2013-10-19 DIAGNOSIS — J45909 Unspecified asthma, uncomplicated: Secondary | ICD-10-CM | POA: Diagnosis not present

## 2013-10-19 DIAGNOSIS — E119 Type 2 diabetes mellitus without complications: Secondary | ICD-10-CM | POA: Insufficient documentation

## 2013-10-19 LAB — IFOBT (OCCULT BLOOD): IMMUNOLOGICAL FECAL OCCULT BLOOD TEST: NEGATIVE

## 2013-10-19 NOTE — Progress Notes (Signed)
IFOBT  Test Result (-) Negative

## 2013-10-19 NOTE — ED Notes (Signed)
Patient called for room, no answer. 

## 2013-10-19 NOTE — ED Notes (Signed)
Patient reports issues with hemorrhoids x 1 week. States being seen by Dr. Oneida Alar. States bright red blood in toilet with bowel movements. Also reports lower abdominal pain, denies vomiting or other symptoms.

## 2013-10-20 ENCOUNTER — Telehealth: Payer: Self-pay

## 2013-10-20 NOTE — ED Notes (Signed)
Unable to locate pt x 3 separate times.

## 2013-10-20 NOTE — Telephone Encounter (Signed)
Pt called and said she is still having some rectal bleeding. She doesn't even have to use the bathroom, it just comes out sometimes and has occurred once this AM.   She said that she went to the ED last evening and waited for 3 hours and just got tired and left.   Pt's iFOBT was negative yesterday and was sent to Dr. Oneida Alar.   Please advise!

## 2013-10-21 NOTE — Telephone Encounter (Signed)
I called and informed pt. She said she did not get the expensive rectal cream, but she did get some. She will use it and she wants to go on and schedule an appt for banding.

## 2013-10-21 NOTE — Telephone Encounter (Signed)
I question if she needs another outpatient banding appt.  Use hemorrhoid cream BID X 7 days Offer OV for consideration of banding.

## 2013-10-23 ENCOUNTER — Encounter: Payer: Self-pay | Admitting: Gastroenterology

## 2013-10-23 NOTE — Telephone Encounter (Signed)
Pt is aware of OV on 8/26 at 0930 with SF and appt card mailed

## 2013-10-26 DIAGNOSIS — G909 Disorder of the autonomic nervous system, unspecified: Secondary | ICD-10-CM | POA: Diagnosis not present

## 2013-10-26 DIAGNOSIS — IMO0001 Reserved for inherently not codable concepts without codable children: Secondary | ICD-10-CM | POA: Diagnosis not present

## 2013-10-26 DIAGNOSIS — E782 Mixed hyperlipidemia: Secondary | ICD-10-CM | POA: Diagnosis not present

## 2013-10-28 NOTE — Telephone Encounter (Signed)
PT NEEDS [facility] BANDING OR SURGERY REFERRAL FOR HEMORRHOIDECTOMY.

## 2013-10-28 NOTE — Telephone Encounter (Signed)
REVIEWED.  

## 2013-11-04 ENCOUNTER — Other Ambulatory Visit (HOSPITAL_COMMUNITY): Payer: Self-pay | Admitting: Internal Medicine

## 2013-11-04 DIAGNOSIS — Z1231 Encounter for screening mammogram for malignant neoplasm of breast: Secondary | ICD-10-CM

## 2013-11-25 ENCOUNTER — Ambulatory Visit (INDEPENDENT_AMBULATORY_CARE_PROVIDER_SITE_OTHER): Payer: Medicare Other | Admitting: Gastroenterology

## 2013-11-25 ENCOUNTER — Other Ambulatory Visit: Payer: Self-pay | Admitting: Gastroenterology

## 2013-11-25 ENCOUNTER — Encounter: Payer: Self-pay | Admitting: Gastroenterology

## 2013-11-25 ENCOUNTER — Encounter (INDEPENDENT_AMBULATORY_CARE_PROVIDER_SITE_OTHER): Payer: Self-pay

## 2013-11-25 VITALS — BP 112/69 | HR 86 | Temp 97.1°F | Ht 66.0 in | Wt 164.2 lb

## 2013-11-25 DIAGNOSIS — K625 Hemorrhage of anus and rectum: Secondary | ICD-10-CM

## 2013-11-25 DIAGNOSIS — K648 Other hemorrhoids: Secondary | ICD-10-CM | POA: Diagnosis not present

## 2013-11-25 NOTE — Patient Instructions (Addendum)
DRINK WATER TO KEEP YOUR URINE LIGHT YELLOW.  FOLLOW A HIGH FIBER DIET. SEE INFO BELOW.  SURGERY REFERRAL AFTER SEP 9 TO EVALUATE FOR HEMORRHOIDECTOMY.   CALL AFTER YOUR SURGERY AND I WILL RE-START THE AMITIZA.   FOLLOW UP IN DEC 2015.    High-Fiber Diet A high-fiber diet changes your normal diet to include more whole grains, legumes, fruits, and vegetables. Changes in the diet involve replacing refined carbohydrates with unrefined foods. The calorie level of the diet is essentially unchanged. The Dietary Reference Intake (recommended amount) for adult males is 38 grams per day. For adult females, it is 25 grams per day. Pregnant and lactating women should consume 28 grams of fiber per day. Fiber is the intact part of a plant that is not broken down during digestion. Functional fiber is fiber that has been isolated from the plant to provide a beneficial effect in the body. PURPOSE  Increase stool bulk.   Ease and regulate bowel movements.   Lower cholesterol.  INDICATIONS THAT YOU NEED MORE FIBER  Constipation and hemorrhoids.   Uncomplicated diverticulosis (intestine condition) and irritable bowel syndrome.   Weight management.   As a protective measure against hardening of the arteries (atherosclerosis), diabetes, and cancer.   GUIDELINES FOR INCREASING FIBER IN THE DIET  Start adding fiber to the diet slowly. A gradual increase of about 5 more grams (2 slices of whole-wheat bread, 2 servings of most fruits or vegetables, or 1 bowl of high-fiber cereal) per day is best. Too rapid an increase in fiber may result in constipation, flatulence, and bloating.   Drink enough water and fluids to keep your urine clear or pale yellow. Water, juice, or caffeine-free drinks are recommended. Not drinking enough fluid may cause constipation.   Eat a variety of high-fiber foods rather than one type of fiber.   Try to increase your intake of fiber through using high-fiber foods rather than  fiber pills or supplements that contain small amounts of fiber.   The goal is to change the types of food eaten. Do not supplement your present diet with high-fiber foods, but replace foods in your present diet.  INCLUDE A VARIETY OF FIBER SOURCES  Replace refined and processed grains with whole grains, canned fruits with fresh fruits, and incorporate other fiber sources. White rice, white breads, and most bakery goods contain little or no fiber.   Brown whole-grain rice, buckwheat oats, and many fruits and vegetables are all good sources of fiber. These include: broccoli, Brussels sprouts, cabbage, cauliflower, beets, sweet potatoes, white potatoes (skin on), carrots, tomatoes, eggplant, squash, berries, fresh fruits, and dried fruits.   Cereals appear to be the richest source of fiber. Cereal fiber is found in whole grains and bran. Bran is the fiber-rich outer coat of cereal grain, which is largely removed in refining. In whole-grain cereals, the bran remains. In breakfast cereals, the largest amount of fiber is found in those with "bran" in their names. The fiber content is sometimes indicated on the label.   You may need to include additional fruits and vegetables each day.   In baking, for 1 cup white flour, you may use the following substitutions:   1 cup whole-wheat flour minus 2 tablespoons.   1/2 cup white flour plus 1/2 cup whole-wheat flour.
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
## 2013-11-25 NOTE — Assessment & Plan Note (Addendum)
Sx not ideally controlled AFTER 6 BAND PLACED SINCE LATE FALL 2014. BLEEDING IMPROVED WITH ANUSOL CREAM. SX LESS LIKELY DUE TO ANAL FISSURE.  SURGERY REFERRAL AFTER SEP 8 DUE TO PT REQUEST. PT WILL CALL AFTER SURGERY AND WILL RE-START AMITIZA. LINZESS CAUSED EXPLOSIVE DIARRHEA. FOLLOW UP IN DEC 2015.

## 2013-11-25 NOTE — Progress Notes (Signed)
REMINDER IN EPIC °

## 2013-12-04 NOTE — Telephone Encounter (Signed)
JUL 14 Hb 12. OPV AUG 26. REFERRED TO SURGERY.

## 2013-12-10 ENCOUNTER — Ambulatory Visit (HOSPITAL_COMMUNITY)
Admission: RE | Admit: 2013-12-10 | Discharge: 2013-12-10 | Disposition: A | Discharge status: Home / Self Care | Payer: Medicare Other | Source: Ambulatory Visit | Attending: Internal Medicine | Admitting: Internal Medicine

## 2013-12-10 DIAGNOSIS — Z1231 Encounter for screening mammogram for malignant neoplasm of breast: Secondary | ICD-10-CM | POA: Insufficient documentation

## 2013-12-17 DIAGNOSIS — K648 Other hemorrhoids: Secondary | ICD-10-CM | POA: Diagnosis not present

## 2013-12-18 NOTE — H&P (Signed)
  NTS SOAP Note  Vital Signs:  Vitals as of: 5/85/2778: Systolic 97: Diastolic 64: Heart Rate 82: Temp 97.23F: Height 50ft 6in: Weight 165Lbs 0 Ounces: BMI 26.63  BMI : 26.63 kg/m2  Subjective: This 48 year old female presents for of hemorrhoidal bleeding.  Has been passing blood per rectum for some time now.  Hemorrhoidal banding has not been helpful.   Review of Symptoms:  Constitutional:unremarkable   Head:unremarkable Eyes:pain bilateral Nose/Mouth/Throat:unremarkable Cardiovascular:  unremarkable Respiratory:wheezing Gastrointestinal:  unremarkable   Genitourinary:unremarkable   joint,  neck,  and back pain Skin:unremarkable Hematolgic/Lymphatic:unremarkable   Allergic/Immunologic:unremarkable   Past Medical History:  Reviewed  Past Medical History  Surgical History: hemorrhoidal banding x 6, BTL,  cholecystectomy Medical Problems: DM,  sarcoidosis,  HTN,  COPD  Allergies: shellfish Medications: baby asa,  lotensin,  voltaren,  tricor,  neurontin,  metformin,  lyrica,  zantac   Social History:Reviewed  Social History  Preferred Language: English Race:  Black or African American Ethnicity: Not Hispanic / Latino Age: 25 year Marital Status:  S Alcohol: yes   Smoking Status: Current every day smoker reviewed on 12/17/2013 Started Date:  Packs per week:  Functional Status reviewed on 12/17/2013 ------------------------------------------------ Bathing: Normal Cooking: Normal Dressing: Normal Driving: Normal Eating: Normal Managing Meds: Normal Oral Care: Normal Shopping: Normal Toileting: Normal Transferring: Normal Walking: Normal Cognitive Status reviewed on 12/17/2013 ------------------------------------------------ Attention: Normal Decision Making: Normal Language: Normal Memory: Normal Motor: Normal Perception: Normal Problem Solving: Normal Visual and Spatial: Normal   Family History:Reviewed  Family Health  History Mother, Deceased; Pharyngeal cancer;  Father, Living; Diabetes mellitus, unspecified type;     Objective Information: General:Well appearing, well nourished in no distress. Heart:RRR, no murmur Lungs:  CTA bilaterally, no wheezes, rhonchi, rales.  Breathing unlabored. Posterior sentinel tag noted.  Mild internal hemorrhoidal disease noted.  No external hemorrhoids.  No fissure palpable.  Assessment:Heniorrhoidal bleeding  Diagnoses: 455.2  K64.8 Bleeding internal hemorrhoids (Other hemorrhoids)  Procedures: 24235 - OFFICE OUTPATIENT NEW 30 MINUTES    Plan:  Scheduled for extensive hemorrhoidectomy on 12/25/13.   Patient Education:Alternative treatments to surgery were discussed with patient (and family).  Risks and benefits  of procedure including bleeding and recurrence were fully explained to the patient (and family) who gave informed consent. Patient/family questions were addressed.  Follow-up:Pending Surgery

## 2013-12-18 NOTE — Patient Instructions (Signed)
[name]  12/18/2013   Your procedure is scheduled on:   12/25/2013  Report to [facility] at  66  AM.  Call this number if you have problems the morning of surgery: [phone]   Remember:   Do not eat food or drink liquids after midnight.   Take these medicines the morning of surgery with A SIP OF WATER:  Benazepril, neurontin, phenergan, zantac   Do not wear jewelry, make-up or nail polish.  Do not wear lotions, powders, or perfumes.   Do not shave 48 hours prior to surgery. Men may shave face and neck.  Do not bring valuables to the hospital.  [facility] is not responsible for any belongings or valuables.               Contacts, dentures or bridgework may not be worn into surgery.  Leave suitcase in the car. After surgery it may be brought to your room.  For patients admitted to the hospital, discharge time is determined by your treatment team.               Patients discharged the day of surgery will not be allowed to drive home.  Name and phone number of your driver: family  Special Instructions: Shower using CHG 2 nights before surgery and the night before surgery.  If you shower the day of surgery use CHG.  Use special wash - you have one bottle of CHG for all showers.  You should use approximately 1/3 of the bottle for each shower.   Please read over the following fact sheets that you were given: Pain Booklet, Coughing and Deep Breathing, Surgical Site Infection Prevention, Anesthesia Post-op Instructions and Care and Recovery After Surgery Hemorrhoidectomy Hemorrhoidectomy is surgery to remove hemorrhoids. Hemorrhoids are veins that have become swollen in the rectum. The rectum is the area from the bottom end of the intestines to the opening where bowel movements leave the body. Hemorrhoids can be uncomfortable. They can cause itching, bleeding and pain if a blood clot forms in them (thrombose). If hemorrhoids are small, surgery may not be needed. But if they  cover a larger area, surgery is usually suggested.  LET YOUR CAREGIVER KNOW ABOUT:   Any allergies.  All medications you are taking, including:  Herbs, eyedrops, over-the-counter medications and creams.  Blood thinners (anticoagulants), aspirin or other drugs that could affect blood clotting.  Use of steroids (by mouth or as creams).  Previous problems with anesthetics, including local anesthetics.  Possibility of pregnancy, if this applies.  Any history of blood clots.  Any history of bleeding or other blood problems.  Previous surgery.  Smoking history.  Other health problems. RISKS AND COMPLICATIONS All surgery carries some risk. However, hemorrhoid surgery usually goes smoothly. Possible complications could include:  Urinary retention.  Bleeding.  Infection.  A painful incision.  A reaction to the anesthesia (this is not common). BEFORE THE PROCEDURE   Stop using aspirin and non-steroidal anti-inflammatory drugs (NSAIDs) for pain relief. This includes prescription drugs and over-the-counter drugs such as ibuprofen and naproxen. Also stop taking vitamin E. If possible, do this two weeks before your surgery.  If you take blood-thinners, ask your healthcare provider when you should stop taking them.  You will probably have blood and urine tests done several days before your surgery.  Do not eat or drink for about 8 hours before the surgery.  Arrive at least an hour before the surgery, or whenever your  surgeon recommends. This will give you time to check in and fill out any needed paperwork.  Hemorrhoidectomy is often an outpatient procedure. This means you will be able to go home the same day. Sometimes, though, people stay overnight in the hospital after the procedure. Ask your surgeon what to expect. Either way, make arrangements in advance for someone to drive you home. PROCEDURE   The preparation:  You will change into a hospital gown.  You will be given  an IV. A needle will be inserted in your arm. Medication can flow directly into your body through this needle.  You might be given an enema to clear your rectum.  Once in the operating room, you will probably lie on your side or be repositioned later to lying on your stomach.  You will be given anesthesia (medication) so you will not feel anything during the surgery. The surgery often is done with local anesthesia (the area near the hemorrhoids will be numb and you will be drowsy but awake). Sometimes, general anesthesia is used (you will be asleep during the procedure).  The procedure:  There are a few different procedures for hemorrhoids. Be sure to ask you surgeon about the procedure, the risks and benefits.  Be sure to ask about what you need to do to take care of the wound, if there is one. AFTER THE PROCEDURE  You will stay in a recovery area until the anesthesia has worn off. Your blood pressure and pulse will be checked every so often.  You may feel a lot of pain in the area of the rectum.  Take all pain medication prescribed by your surgeon. Ask before taking any over-the-counter pain medicines.  Sometimes sitting in a warm bath can help relieve your pain.  To make sure you have bowel movements without straining:  You will probably need to take stool softeners (usually a pill) for a few days.  You should drink 8 to 10 glasses of water each day.  Your activity will be restricted for awhile. Ask your caregiver for a list of what you should and should not do while you recover. Document Released: 01/14/2009 Document Revised: 06/11/2011 Document Reviewed: 01/14/2009 [facility] Patient Information 2015 [place], [place]. This information is not intended to replace advice given to you by your health care provider. Make sure you discuss any questions you have with your health care provider. PATIENT INSTRUCTIONS POST-ANESTHESIA  IMMEDIATELY FOLLOWING SURGERY:  Do not drive or operate  machinery for the first twenty four hours after surgery.  Do not make any important decisions for twenty four hours after surgery or while taking narcotic pain medications or sedatives.  If you develop intractable nausea and vomiting or a severe headache please notify your doctor immediately.  FOLLOW-UP:  Please make an appointment with your surgeon as instructed. You do not need to follow up with anesthesia unless specifically instructed to do so.  WOUND CARE INSTRUCTIONS (if applicable):  Keep a dry clean dressing on the anesthesia/puncture wound site if there is drainage.  Once the wound has quit draining you may leave it open to air.  Generally you should leave the bandage intact for twenty four hours unless there is drainage.  If the epidural site drains for more than 36-48 hours please call the anesthesia department.  QUESTIONS?:  Please feel free to call your physician or the hospital operator if you have any questions, and they will be happy to assist you.

## 2013-12-21 ENCOUNTER — Encounter (HOSPITAL_COMMUNITY)
Admission: RE | Admit: 2013-12-21 | Discharge: 2013-12-21 | Disposition: A | Discharge status: Home / Self Care | Payer: Medicare Other | Source: Ambulatory Visit | Attending: General Surgery | Admitting: General Surgery

## 2013-12-21 ENCOUNTER — Ambulatory Visit (HOSPITAL_COMMUNITY)
Admission: RE | Admit: 2013-12-21 | Discharge: 2013-12-21 | Disposition: A | Discharge status: Home / Self Care | Payer: Medicare Other | Source: Ambulatory Visit | Attending: General Surgery | Admitting: General Surgery

## 2013-12-21 ENCOUNTER — Encounter (HOSPITAL_COMMUNITY): Payer: Self-pay

## 2013-12-21 DIAGNOSIS — J984 Other disorders of lung: Secondary | ICD-10-CM | POA: Diagnosis not present

## 2013-12-21 DIAGNOSIS — K649 Unspecified hemorrhoids: Secondary | ICD-10-CM | POA: Insufficient documentation

## 2013-12-21 DIAGNOSIS — Z8619 Personal history of other infectious and parasitic diseases: Secondary | ICD-10-CM | POA: Insufficient documentation

## 2013-12-21 DIAGNOSIS — Z01818 Encounter for other preprocedural examination: Secondary | ICD-10-CM | POA: Diagnosis not present

## 2013-12-21 HISTORY — DX: Other specified diabetes mellitus with diabetic neuropathy, unspecified: E13.40

## 2013-12-21 HISTORY — DX: Gastro-esophageal reflux disease without esophagitis: K21.9

## 2013-12-21 LAB — CBC WITH DIFFERENTIAL/PLATELET
BASOS PCT: 0 % (ref 0–1)
Basophils Absolute: 0 10*3/uL (ref 0.0–0.1)
EOS ABS: 0 10*3/uL (ref 0.0–0.7)
Eosinophils Relative: 1 % (ref 0–5)
HCT: 32.5 % — ABNORMAL LOW (ref 36.0–46.0)
Hemoglobin: 11 g/dL — ABNORMAL LOW (ref 12.0–15.0)
Lymphocytes Relative: 42 % (ref 12–46)
Lymphs Abs: 2.4 10*3/uL (ref 0.7–4.0)
MCH: 29.3 pg (ref 26.0–34.0)
MCHC: 33.8 g/dL (ref 30.0–36.0)
MCV: 86.7 fL (ref 78.0–100.0)
MONO ABS: 0.5 10*3/uL (ref 0.1–1.0)
Monocytes Relative: 8 % (ref 3–12)
NEUTROS ABS: 2.8 10*3/uL (ref 1.7–7.7)
NEUTROS PCT: 49 % (ref 43–77)
Platelets: 298 10*3/uL (ref 150–400)
RBC: 3.75 MIL/uL — ABNORMAL LOW (ref 3.87–5.11)
RDW: 12.5 % (ref 11.5–15.5)
WBC: 5.6 10*3/uL (ref 4.0–10.5)

## 2013-12-21 LAB — BASIC METABOLIC PANEL
ANION GAP: 12 (ref 5–15)
BUN: 15 mg/dL (ref 6–23)
CALCIUM: 9.2 mg/dL (ref 8.4–10.5)
CO2: 26 mEq/L (ref 19–32)
Chloride: 105 mEq/L (ref 96–112)
Creatinine, Ser: 0.78 mg/dL (ref 0.50–1.10)
GFR calc non Af Amer: >90 mL/min (ref >90)
Glucose, Bld: 83 mg/dL (ref 70–99)
Potassium: 3.7 mEq/L (ref 3.7–5.3)
Sodium: 143 mEq/L (ref 137–147)

## 2013-12-21 NOTE — Pre-Procedure Instructions (Signed)
Patient given information to sign up for my chart at home. 

## 2013-12-25 ENCOUNTER — Encounter (HOSPITAL_COMMUNITY): Payer: Medicare Other | Admitting: Anesthesiology

## 2013-12-25 ENCOUNTER — Ambulatory Visit (HOSPITAL_COMMUNITY)
Admission: RE | Admit: 2013-12-25 | Discharge: 2013-12-25 | Disposition: A | Discharge status: Home / Self Care | Payer: Medicare Other | Source: Ambulatory Visit | Attending: General Surgery | Admitting: General Surgery

## 2013-12-25 ENCOUNTER — Encounter (HOSPITAL_COMMUNITY)
Admission: RE | Disposition: A | Discharge status: Home / Self Care | Payer: Self-pay | Source: Ambulatory Visit | Attending: General Surgery

## 2013-12-25 ENCOUNTER — Encounter (HOSPITAL_COMMUNITY): Payer: Self-pay | Admitting: *Deleted

## 2013-12-25 ENCOUNTER — Ambulatory Visit (HOSPITAL_COMMUNITY): Payer: Medicare Other | Admitting: Anesthesiology

## 2013-12-25 DIAGNOSIS — Z79899 Other long term (current) drug therapy: Secondary | ICD-10-CM | POA: Insufficient documentation

## 2013-12-25 DIAGNOSIS — E119 Type 2 diabetes mellitus without complications: Secondary | ICD-10-CM | POA: Diagnosis not present

## 2013-12-25 DIAGNOSIS — D649 Anemia, unspecified: Secondary | ICD-10-CM | POA: Diagnosis not present

## 2013-12-25 DIAGNOSIS — J4489 Other specified chronic obstructive pulmonary disease: Secondary | ICD-10-CM | POA: Diagnosis not present

## 2013-12-25 DIAGNOSIS — Z791 Long term (current) use of non-steroidal anti-inflammatories (NSAID): Secondary | ICD-10-CM | POA: Diagnosis not present

## 2013-12-25 DIAGNOSIS — Z87891 Personal history of nicotine dependence: Secondary | ICD-10-CM | POA: Insufficient documentation

## 2013-12-25 DIAGNOSIS — I10 Essential (primary) hypertension: Secondary | ICD-10-CM | POA: Diagnosis not present

## 2013-12-25 DIAGNOSIS — K649 Unspecified hemorrhoids: Secondary | ICD-10-CM | POA: Diagnosis not present

## 2013-12-25 DIAGNOSIS — K219 Gastro-esophageal reflux disease without esophagitis: Secondary | ICD-10-CM | POA: Insufficient documentation

## 2013-12-25 DIAGNOSIS — K648 Other hemorrhoids: Secondary | ICD-10-CM | POA: Diagnosis not present

## 2013-12-25 DIAGNOSIS — J449 Chronic obstructive pulmonary disease, unspecified: Secondary | ICD-10-CM | POA: Insufficient documentation

## 2013-12-25 DIAGNOSIS — K6289 Other specified diseases of anus and rectum: Secondary | ICD-10-CM | POA: Diagnosis not present

## 2013-12-25 DIAGNOSIS — Z7982 Long term (current) use of aspirin: Secondary | ICD-10-CM | POA: Diagnosis not present

## 2013-12-25 HISTORY — PX: HEMORRHOID SURGERY: SHX153

## 2013-12-25 LAB — GLUCOSE, CAPILLARY
GLUCOSE-CAPILLARY: 87 mg/dL (ref 70–99)
Glucose-Capillary: 84 mg/dL (ref 70–99)

## 2013-12-25 SURGERY — HEMORRHOIDECTOMY
Anesthesia: General | Site: Buttocks

## 2013-12-25 MED ORDER — MIDAZOLAM HCL 2 MG/2ML IJ SOLN
INTRAMUSCULAR | Status: AC
  Filled 2013-12-25: qty 2

## 2013-12-25 MED ORDER — LIDOCAINE HCL (PF) 1 % IJ SOLN
INTRAMUSCULAR | Status: AC
  Filled 2013-12-25: qty 5

## 2013-12-25 MED ORDER — BUPIVACAINE HCL (PF) 0.5 % IJ SOLN
INTRAMUSCULAR | Status: DC | PRN
  Administered 2013-12-25: 10 mL

## 2013-12-25 MED ORDER — LIDOCAINE VISCOUS 2 % MT SOLN
OROMUCOSAL | Status: AC
  Filled 2013-12-25: qty 15

## 2013-12-25 MED ORDER — ONDANSETRON HCL 4 MG/2ML IJ SOLN: 4.0000 mg | Freq: Once | INTRAMUSCULAR | Status: DC | PRN

## 2013-12-25 MED ORDER — OXYCODONE-ACETAMINOPHEN 7.5-325 MG PO TABS: 1.0000 | ORAL_TABLET | ORAL | Status: DC | PRN

## 2013-12-25 MED ORDER — ONDANSETRON HCL 4 MG/2ML IJ SOLN
4.0000 mg | Freq: Once | INTRAMUSCULAR | Status: AC
  Administered 2013-12-25: 4 mg via INTRAVENOUS

## 2013-12-25 MED ORDER — PROPOFOL 10 MG/ML IV BOLUS
INTRAVENOUS | Status: AC
  Filled 2013-12-25: qty 20

## 2013-12-25 MED ORDER — ROCURONIUM BROMIDE 50 MG/5ML IV SOLN
INTRAVENOUS | Status: AC
  Filled 2013-12-25: qty 1

## 2013-12-25 MED ORDER — METRONIDAZOLE IN NACL 5-0.79 MG/ML-% IV SOLN
500.0000 mg | INTRAVENOUS | Status: AC
  Administered 2013-12-25: 500 mg via INTRAVENOUS
  Filled 2013-12-25: qty 100

## 2013-12-25 MED ORDER — DEXAMETHASONE SODIUM PHOSPHATE 4 MG/ML IJ SOLN
4.0000 mg | Freq: Once | INTRAMUSCULAR | Status: AC
  Administered 2013-12-25: 4 mg via INTRAVENOUS
  Filled 2013-12-25: qty 1

## 2013-12-25 MED ORDER — SUCCINYLCHOLINE CHLORIDE 20 MG/ML IJ SOLN
INTRAMUSCULAR | Status: AC
  Filled 2013-12-25: qty 1

## 2013-12-25 MED ORDER — DEXTROSE 50 % IV SOLN
INTRAVENOUS | Status: AC
  Filled 2013-12-25: qty 50

## 2013-12-25 MED ORDER — GLYCOPYRROLATE 0.2 MG/ML IJ SOLN
INTRAMUSCULAR | Status: AC
  Filled 2013-12-25: qty 3

## 2013-12-25 MED ORDER — PROPOFOL 10 MG/ML IV BOLUS
INTRAVENOUS | Status: DC | PRN
Start: 2013-12-25 — End: 2013-12-25
  Administered 2013-12-25: 150 mg via INTRAVENOUS

## 2013-12-25 MED ORDER — KETOROLAC TROMETHAMINE 30 MG/ML IJ SOLN
30.0000 mg | Freq: Once | INTRAMUSCULAR | Status: AC
  Administered 2013-12-25: 30 mg via INTRAVENOUS
  Filled 2013-12-25: qty 1

## 2013-12-25 MED ORDER — SUCCINYLCHOLINE CHLORIDE 20 MG/ML IJ SOLN
INTRAMUSCULAR | Status: DC | PRN
  Administered 2013-12-25: 120 mg via INTRAVENOUS

## 2013-12-25 MED ORDER — LIDOCAINE HCL 1 % IJ SOLN
INTRAMUSCULAR | Status: DC | PRN
  Administered 2013-12-25: 40 mg via INTRADERMAL

## 2013-12-25 MED ORDER — ONDANSETRON HCL 4 MG/2ML IJ SOLN
INTRAMUSCULAR | Status: AC
  Filled 2013-12-25: qty 2

## 2013-12-25 MED ORDER — FENTANYL CITRATE 0.05 MG/ML IJ SOLN: 25.0000 ug | INTRAMUSCULAR | Status: DC | PRN

## 2013-12-25 MED ORDER — FENTANYL CITRATE 0.05 MG/ML IJ SOLN
INTRAMUSCULAR | Status: AC
  Filled 2013-12-25: qty 5

## 2013-12-25 MED ORDER — LIDOCAINE VISCOUS 2 % MT SOLN
OROMUCOSAL | Status: DC | PRN
  Administered 2013-12-25: 20 mL via OROMUCOSAL

## 2013-12-25 MED ORDER — SODIUM CHLORIDE 0.9 % IR SOLN
Status: DC | PRN
  Administered 2013-12-25: 1000 mL

## 2013-12-25 MED ORDER — BUPIVACAINE HCL (PF) 0.5 % IJ SOLN
INTRAMUSCULAR | Status: AC
  Filled 2013-12-25: qty 30

## 2013-12-25 MED ORDER — FENTANYL CITRATE 0.05 MG/ML IJ SOLN
INTRAMUSCULAR | Status: DC | PRN
  Administered 2013-12-25: 50 ug via INTRAVENOUS

## 2013-12-25 MED ORDER — MIDAZOLAM HCL 2 MG/2ML IJ SOLN
1.0000 mg | INTRAMUSCULAR | Status: DC | PRN
  Administered 2013-12-25: 2 mg via INTRAVENOUS

## 2013-12-25 MED ORDER — DEXTROSE 50 % IV SOLN
12.5000 g | Freq: Once | INTRAVENOUS | Status: AC
  Administered 2013-12-25: 12.5 g via INTRAVENOUS

## 2013-12-25 MED ORDER — LACTATED RINGERS IV SOLN
INTRAVENOUS | Status: DC
  Administered 2013-12-25: 07:00:00 via INTRAVENOUS

## 2013-12-25 SURGICAL SUPPLY — 32 items
BAG HAMPER (MISCELLANEOUS) ×2 IMPLANT
CLOTH BEACON ORANGE TIMEOUT ST (SAFETY) ×2 IMPLANT
COVER LIGHT HANDLE STERIS (MISCELLANEOUS) ×4 IMPLANT
COVER MAYO STAND XLG (DRAPE) ×2 IMPLANT
DECANTER SPIKE VIAL GLASS SM (MISCELLANEOUS) ×2 IMPLANT
DRAPE PROXIMA HALF (DRAPES) ×2 IMPLANT
ELECT REM PT RETURN 9FT ADLT (ELECTROSURGICAL) ×2
ELECTRODE REM PT RTRN 9FT ADLT (ELECTROSURGICAL) ×1 IMPLANT
FORMALIN 10 PREFIL 120ML (MISCELLANEOUS) ×2 IMPLANT
GAUZE SPONGE 4X4 12PLY STRL (GAUZE/BANDAGES/DRESSINGS) ×2 IMPLANT
GLOVE INDICATOR 7.0 STRL GRN (GLOVE) ×4 IMPLANT
GLOVE SS BIOGEL STRL SZ 7 (GLOVE) ×2 IMPLANT
GLOVE SUPERSENSE BIOGEL SZ 7 (GLOVE) ×2
GLOVE SURG SS PI 7.5 STRL IVOR (GLOVE) ×4 IMPLANT
GOWN STRL REUS W/ TWL XL LVL3 (GOWN DISPOSABLE) ×1 IMPLANT
GOWN STRL REUS W/TWL LRG LVL3 (GOWN DISPOSABLE) ×2 IMPLANT
GOWN STRL REUS W/TWL XL LVL3 (GOWN DISPOSABLE) ×1
HEMOSTAT SURGICEL 4X8 (HEMOSTASIS) ×2 IMPLANT
KIT ROOM TURNOVER AP CYSTO (KITS) ×2 IMPLANT
LIGASURE IMPACT 36 18CM CVD LR (INSTRUMENTS) IMPLANT
MANIFOLD NEPTUNE II (INSTRUMENTS) ×2 IMPLANT
NEEDLE HYPO 25X1 1.5 SAFETY (NEEDLE) ×2 IMPLANT
NS IRRIG 1000ML POUR BTL (IV SOLUTION) ×2 IMPLANT
PACK PERI GYN (CUSTOM PROCEDURE TRAY) ×2 IMPLANT
PAD ARMBOARD 7.5X6 YLW CONV (MISCELLANEOUS) ×2 IMPLANT
SET BASIN LINEN APH (SET/KITS/TRAYS/PACK) ×2 IMPLANT
SPONGE GAUZE 4X4 12PLY (GAUZE/BANDAGES/DRESSINGS) ×2 IMPLANT
SURGILUBE 3G PEEL PACK STRL (MISCELLANEOUS) ×2 IMPLANT
SUT SILK 0 FSL (SUTURE) ×2 IMPLANT
SUT VIC AB 2-0 CT2 27 (SUTURE) IMPLANT
SYR CONTROL 10ML LL (SYRINGE) ×2 IMPLANT
SYRINGE CONTROL L 12CC (SYRINGE) ×2 IMPLANT

## 2013-12-25 NOTE — Transfer of Care (Signed)
Immediate Anesthesia Transfer of Care Note  Patient: [name]  Procedure(s) Performed: Procedure(s): EXTENSIVE HEMORRHOIDECTOMY (N/A)  Patient Location: PACU  Anesthesia Type:General  Level of Consciousness: awake, alert  and oriented  Airway & Oxygen Therapy: Patient Spontanous Breathing and Patient connected to face mask oxygen  Post-op Assessment: Report given to PACU RN  Post vital signs: Reviewed and stable  Complications: No apparent anesthesia complications

## 2013-12-25 NOTE — Addendum Note (Signed)
Addendum created 12/25/13 6789 by Ollen Bowl, CRNA   Modules edited: Anesthesia Blocks and Procedures, Clinical Notes   Clinical Notes:  File: [id number]; File: [id number]

## 2013-12-25 NOTE — Anesthesia Postprocedure Evaluation (Signed)
  Anesthesia Post-op Note  Patient: [name]  Procedure(s) Performed: Procedure(s): EXTENSIVE HEMORRHOIDECTOMY (N/A)  Patient Location: PACU  Anesthesia Type:General  Level of Consciousness: awake, alert  and oriented  Airway and Oxygen Therapy: Patient Spontanous Breathing and Patient connected to face mask oxygen  Post-op Pain: none  Post-op Assessment: Post-op Vital signs reviewed, Patient's Cardiovascular Status Stable, Respiratory Function Stable, Patent Airway and No signs of Nausea or vomiting  Post-op Vital Signs: Reviewed and stable  Last Vitals:  Filed Vitals:   12/25/13 0715  BP: 114/60  Pulse:   Temp:   Resp: 17    Complications: No apparent anesthesia complications

## 2013-12-25 NOTE — Discharge Instructions (Signed)
Hemorrhoidectomy °Care After °Hemorrhoidectomy is the removal of enlarged (dilated) veins around the rectum. Until the surgical areas are healed, control of pain and avoiding constipation are the greatest challenges for patients.  °For as long as 24 hours after receiving an anesthetic (the medication that made you sleep), and while taking narcotic pain relievers, you may feel dizzy, weak and drowsy. For that reason, the following information applies to the first 24-hour period following surgery, and continues for as long as you are taking narcotic pain medications. °· Do not drive a car, ride a bicycle, participate in activities in which you could be hurt. Do not take public transportation until you are off narcotic pain medications and until your caregiver says it is okay. °· Do not drink alcohol, take tranquilizers, or medications not prescribed or allowed by your surgical caregiver. °· Do not sign important papers or contracts for at least 24 hours or while taking narcotic medications. °· Have a responsible person with you for 24 hours. °RISKS AND COMPLICATIONS °Some problems that may occur following this procedure include: °· Infection. A germ starts growing in the tissue surrounding the site operated on. This can usually be treated with antibiotics. °· Damage to the rectal sphincter could occur. This is the muscle that opens in your anus to allow a bowel movement. This could cause incontinence. This is uncommon. °· Bleeding following surgery can be a complication of almost any surgery. Your surgeon takes every precaution to keep this from happening. °· Complications of anesthesia. °HOME CARE INSTRUCTIONS °· Avoid straining when having bowel movements. °· Avoid heavy lifting (more than 10 pounds (4.5 kilograms)). °· Only take over-the-counter or prescription medicines for pain, discomfort, or fever as directed by your caregiver. °· Take hot sitz baths for 20 to 30 minutes, 3 to 4 times per day. °· To keep  swelling down, apply an ice pack for twenty minutes three to four times per day between sitz baths. Use a towel between your skin and the ice pack. Do not do this if it causes too much discomfort. °· Keep anal area clean and dry. Following a bowel movement, you can gently wash the area with tucks (available for purchase at a drugstore) or cotton swabs. Gently pat the area dry. Do not rub the area. °· Eat a well balanced diet and drink 6 to 8 glasses of water every day to avoid constipation. A bulk laxative may be also be helpful. °SEEK MEDICAL CARE IF:  °· You have increasing pain or tenderness near or in the surgical site. °· You are unable to eat or drink. °· You develop nausea or vomiting. °· You develop uncontrolled bleeding such as soaking two to three pads in one hour. °· You have constipation, not helped by changing your diet or increasing your fluid intake. Pain medications are a common cause of constipation. °· You have pain and redness (inflammation) extending outside the area of your surgery. °· You develop an unexplained oral temperature above 102° F (38.9° C), or any other signs of infection. °· You have any other questions or concerns following surgery. °Document Released: 06/09/2003 Document Revised: 06/11/2011 Document Reviewed: 09/06/2008 °[employer]® Patient Information ©2015 [employer]. This information is not intended to replace advice given to you by your health care provider. Make sure you discuss any questions you have with your health care provider. ° °

## 2013-12-25 NOTE — Anesthesia Procedure Notes (Addendum)
Procedure Name: Intubation Date/Time: 12/25/2013 7:44 AM Performed by: Tressie Stalker E Pre-anesthesia Checklist: Patient identified, Patient being monitored, Timeout performed, Emergency Drugs available and Suction available Patient Re-evaluated:Patient Re-evaluated prior to inductionOxygen Delivery Method: Circle System Utilized Preoxygenation: Pre-oxygenation with 100% oxygen Intubation Type: IV induction, Rapid sequence and Cricoid Pressure applied Ventilation: Mask ventilation without difficulty Laryngoscope Size: Mac and 3 Grade View: Grade I Tube type: Oral Tube size: 7.0 mm Number of attempts: 1 Airway Equipment and Method: stylet Placement Confirmation: ETT inserted through vocal cords under direct vision,  positive ETCO2 and breath sounds checked- equal and bilateral Secured at: 21 cm Tube secured with: Tape Dental Injury: Teeth and Oropharynx as per pre-operative assessment

## 2013-12-25 NOTE — Anesthesia Preprocedure Evaluation (Signed)
Anesthesia Evaluation  Patient identified by MRN, date of birth, ID band Patient awake    Reviewed: Allergy & Precautions, H&P , NPO status , Patient's Chart, lab work & pertinent test results  Airway Mallampati: I TM Distance: >3 FB     Dental  (+) Teeth Intact, Poor Dentition, Missing   Pulmonary asthma , former smoker,  Hx sarcoidosis breath sounds clear to auscultation        Cardiovascular hypertension, Pt. on medications Rhythm:Regular Rate:Normal     Neuro/Psych    GI/Hepatic GERD-  Medicated and Poorly Controlled,  Endo/Other  diabetes, Well Controlled, Type 2, Oral Hypoglycemic Agents  Renal/GU      Musculoskeletal   Abdominal   Peds  Hematology  (+) anemia ,   Anesthesia Other Findings   Reproductive/Obstetrics                           Anesthesia Physical Anesthesia Plan  ASA: III  Anesthesia Plan: General   Post-op Pain Management:    Induction: Intravenous, Rapid sequence and Cricoid pressure planned  Airway Management Planned: Oral ETT  Additional Equipment:   Intra-op Plan:   Post-operative Plan: Extubation in OR  Informed Consent: I have reviewed the patients History and Physical, chart, labs and discussed the procedure including the risks, benefits and alternatives for the proposed anesthesia with the patient or authorized representative who has indicated his/her understanding and acceptance.     Plan Discussed with:   Anesthesia Plan Comments:         Anesthesia Quick Evaluation

## 2013-12-25 NOTE — Op Note (Signed)
Patient:  [name]  DOB:  12-Mar-1966  MRN:  [id number]   Preop Diagnosis:  Bleeding hemorrhoids, rectal pain  Postop Diagnosis:  Same  Procedure:  Extensive hemorrhoidectomy  Surgeon:  Aviva Signs, M.D.  Anes:  General endotracheal  Indications:  Patient is a 48 year old black female status post multiple hemorrhoidal banding was in the past who now presents with recurrent hemorrhoidal bleeding and rectal pain. The risks and benefits of the procedure including bleeding, infection, and the possibility of recurrence of hemorrhoidal disease were fully explained to the patient, who gave informed consent.  Procedure note:  The patient was placed in the lithotomy position after induction of general endotracheal anesthesia. The perineum was prepped and draped using the usual sterile technique with Scrub Care. Surgical site confirmation was performed.  On examination the anus, the patient had both internal and external hemorrhoid with a sentinel tag at the 5:00 position. In addition, she had a large internal hemorrhoid at the 7:00 position. Both columns were excised in continuity using the LigaSure in a Ferguson like manner. No other significant hemorrhoids were seen. There was no anal fissure appreciated. Care was taken to avoid the external sphincter mechanism. No abnormal bleeding was noted the end of the procedure. The hemorrhoids were sent to pathology further examination. 0.5% Sensorcaine was instilled into the surrounding peritoneum. Surgicel and Viscous Xylocaine rectal packing was then placed.  All tape and needle counts were correct at the end of the procedure. The patient was extubated in the operating room and transferred to PACU in stable condition.  Complications:  None  EBL:  Minimal  Specimen:  Hemorrhoids

## 2013-12-25 NOTE — Addendum Note (Signed)
Addendum created 12/25/13 8184 by Ollen Bowl, CRNA   Modules edited: Anesthesia Flowsheet

## 2013-12-25 NOTE — Interval H&P Note (Signed)
History and Physical Interval Note:  12/25/2013 7:19 AM  [name]  has presented today for surgery, with the diagnosis of internal bleeding hemorrhoids  The various methods of treatment have been discussed with the patient and family. After consideration of risks, benefits and other options for treatment, the patient has consented to  Procedure(s): EXTENSIVE HEMORRHOIDECTOMY (N/A) as a surgical intervention .  The patient's history has been reviewed, patient examined, no change in status, stable for surgery.  I have reviewed the patient's chart and labs.  Questions were answered to the patient's satisfaction.     Aviva Signs A

## 2013-12-28 ENCOUNTER — Encounter (HOSPITAL_COMMUNITY): Payer: Self-pay | Admitting: General Surgery

## 2014-01-01 DIAGNOSIS — H02206 Unspecified lagophthalmos left eye, unspecified eyelid: Secondary | ICD-10-CM | POA: Diagnosis not present

## 2014-01-01 DIAGNOSIS — D869 Sarcoidosis, unspecified: Secondary | ICD-10-CM | POA: Diagnosis not present

## 2014-01-01 DIAGNOSIS — H02203 Unspecified lagophthalmos right eye, unspecified eyelid: Secondary | ICD-10-CM | POA: Diagnosis not present

## 2014-01-01 DIAGNOSIS — H04123 Dry eye syndrome of bilateral lacrimal glands: Secondary | ICD-10-CM | POA: Diagnosis not present

## 2014-01-09 ENCOUNTER — Other Ambulatory Visit: Payer: Self-pay | Admitting: Adult Health

## 2014-01-28 DIAGNOSIS — I471 Supraventricular tachycardia: Secondary | ICD-10-CM | POA: Diagnosis not present

## 2014-01-28 DIAGNOSIS — E1165 Type 2 diabetes mellitus with hyperglycemia: Secondary | ICD-10-CM | POA: Diagnosis not present

## 2014-01-28 DIAGNOSIS — G589 Mononeuropathy, unspecified: Secondary | ICD-10-CM | POA: Diagnosis not present

## 2014-02-01 ENCOUNTER — Encounter (HOSPITAL_COMMUNITY): Payer: Self-pay | Admitting: General Surgery

## 2014-02-03 DIAGNOSIS — W1809XA Striking against other object with subsequent fall, initial encounter: Secondary | ICD-10-CM | POA: Diagnosis not present

## 2014-02-03 DIAGNOSIS — S8011XA Contusion of right lower leg, initial encounter: Secondary | ICD-10-CM | POA: Diagnosis not present

## 2014-02-03 DIAGNOSIS — S00511A Abrasion of lip, initial encounter: Secondary | ICD-10-CM | POA: Diagnosis not present

## 2014-02-03 DIAGNOSIS — S01319A Laceration without foreign body of unspecified ear, initial encounter: Secondary | ICD-10-CM | POA: Diagnosis not present

## 2014-02-09 ENCOUNTER — Encounter: Payer: Self-pay | Admitting: Gastroenterology

## 2014-02-15 DIAGNOSIS — L03818 Cellulitis of other sites: Secondary | ICD-10-CM | POA: Diagnosis not present

## 2014-03-07 DIAGNOSIS — R0989 Other specified symptoms and signs involving the circulatory and respiratory systems: Secondary | ICD-10-CM | POA: Diagnosis not present

## 2014-03-07 DIAGNOSIS — Z9851 Tubal ligation status: Secondary | ICD-10-CM | POA: Diagnosis not present

## 2014-03-07 DIAGNOSIS — Z9049 Acquired absence of other specified parts of digestive tract: Secondary | ICD-10-CM | POA: Diagnosis not present

## 2014-03-07 DIAGNOSIS — Z7982 Long term (current) use of aspirin: Secondary | ICD-10-CM | POA: Diagnosis not present

## 2014-03-07 DIAGNOSIS — I10 Essential (primary) hypertension: Secondary | ICD-10-CM | POA: Diagnosis not present

## 2014-03-07 DIAGNOSIS — R05 Cough: Secondary | ICD-10-CM | POA: Diagnosis not present

## 2014-03-07 DIAGNOSIS — Z87891 Personal history of nicotine dependence: Secondary | ICD-10-CM | POA: Diagnosis not present

## 2014-03-07 DIAGNOSIS — Z780 Asymptomatic menopausal state: Secondary | ICD-10-CM | POA: Diagnosis not present

## 2014-03-07 DIAGNOSIS — J069 Acute upper respiratory infection, unspecified: Secondary | ICD-10-CM | POA: Diagnosis not present

## 2014-03-07 DIAGNOSIS — Z9889 Other specified postprocedural states: Secondary | ICD-10-CM | POA: Diagnosis not present

## 2014-03-07 DIAGNOSIS — E119 Type 2 diabetes mellitus without complications: Secondary | ICD-10-CM | POA: Diagnosis not present

## 2014-03-07 DIAGNOSIS — J45909 Unspecified asthma, uncomplicated: Secondary | ICD-10-CM | POA: Diagnosis not present

## 2014-03-07 DIAGNOSIS — D869 Sarcoidosis, unspecified: Secondary | ICD-10-CM | POA: Diagnosis not present

## 2014-03-07 DIAGNOSIS — Z79899 Other long term (current) drug therapy: Secondary | ICD-10-CM | POA: Diagnosis not present

## 2014-03-07 DIAGNOSIS — Z91013 Allergy to seafood: Secondary | ICD-10-CM | POA: Diagnosis not present

## 2014-03-07 DIAGNOSIS — R52 Pain, unspecified: Secondary | ICD-10-CM | POA: Diagnosis not present

## 2014-03-07 DIAGNOSIS — E780 Pure hypercholesterolemia: Secondary | ICD-10-CM | POA: Diagnosis not present

## 2014-03-16 DIAGNOSIS — J180 Bronchopneumonia, unspecified organism: Secondary | ICD-10-CM | POA: Diagnosis not present

## 2014-03-23 ENCOUNTER — Other Ambulatory Visit: Payer: Medicare Other | Admitting: Adult Health

## 2014-04-14 DIAGNOSIS — R634 Abnormal weight loss: Secondary | ICD-10-CM | POA: Diagnosis not present

## 2014-04-14 DIAGNOSIS — D869 Sarcoidosis, unspecified: Secondary | ICD-10-CM | POA: Diagnosis not present

## 2014-04-14 DIAGNOSIS — Z23 Encounter for immunization: Secondary | ICD-10-CM | POA: Diagnosis not present

## 2014-04-28 ENCOUNTER — Encounter: Payer: Self-pay | Admitting: Gastroenterology

## 2014-04-28 ENCOUNTER — Ambulatory Visit (INDEPENDENT_AMBULATORY_CARE_PROVIDER_SITE_OTHER): Payer: Medicare Other | Admitting: Gastroenterology

## 2014-04-28 ENCOUNTER — Encounter (INDEPENDENT_AMBULATORY_CARE_PROVIDER_SITE_OTHER): Payer: Self-pay

## 2014-04-28 VITALS — BP 108/62 | HR 86 | Temp 97.1°F | Ht 66.0 in | Wt 164.6 lb

## 2014-04-28 DIAGNOSIS — K642 Third degree hemorrhoids: Secondary | ICD-10-CM

## 2014-04-28 NOTE — Progress Notes (Signed)
Subjective:    Patient ID: [name], female    DOB: Aug 28, 1965, 49 y.o.   MRN: [id number] HASANAJ,XAJE A, MD  HPI BMs: 1-2X/DAY. No questions or concerns.  PT DENIES FEVER, CHILLS, HEMATOCHEZIA, nausea, vomiting, melena, diarrhea, CHEST PAIN, SHORTNESS OF BREATH,   constipation, abdominal pain, problems swallowing,OR heartburn or indigestion.  Past Medical History  Diagnosis Date  . Sarcoidosis   . Diabetes mellitus   . Hypertension   . Asthma   . Abnormal uterine bleeding (AUB) 11/26/2012  . BV (bacterial vaginosis) 11/26/2012  . Fatigue 12/24/2012  . Other and unspecified ovarian cyst 01/01/2013  . Fibroids 01/01/2013  . Hemorrhoid 01/01/2013  . Vaginal discharge 02/12/2013    +yeast  . Constipation   . GERD (gastroesophageal reflux disease)   . Neuropathy due to secondary diabetes    Past Surgical History  Procedure Laterality Date  . Tubal ligation    . Endometrial ablation    . Cholecystectomy    . Hemorrhoid surgery      banding  . Colonoscopy N/A 08/20/2013    Dr. Oneida Alar: normal mucosa in terminal ileum, mild diverticulosis in ascending colon, moderate sized hemorrhoids s/p banding  . Hemorrhoid banding  08/20/2013    Procedure: HEMORRHOID BANDING;  Surgeon: Danie Binder, MD;  Location: [facility] END[facility] SUITE;  Service: Endoscopy;;  . Hemorrhoid surgery N/A 12/25/2013    Procedure: EXTENSIVE HEMORRHOIDECTOMY;  Surgeon: Jamesetta So, MD;  Location: [facility] ORS;  Service: General;  Laterality: N/A;   Allergies  Allergen Reactions  . Shellfish Allergy Anaphylaxis  . Linzess [Linaclotide]     290 MCG CAUSED DIARRHEA    Current Outpatient Prescriptions  Medication Sig Dispense Refill  . aspirin EC 325 MG tablet Take 325 mg by mouth daily.    Marland Kitchen atorvastatin (LIPITOR) 20 MG tablet Take 20 mg by mouth at bedtime.     . benazepril (LOTENSIN) 10 MG tablet Take 10 mg by mouth daily.     . fenofibrate (TRICOR) 145 MG tablet Take 145 mg by mouth at bedtime.     Marland Kitchen ibuprofen  (ADVIL,MOTRIN) 800 MG tablet TAKE ONE TABLET BY MOUTH EVERY 8 HOURS AS NEEDED    . metFORMIN (GLUCOPHAGE) 1000 MG tablet Take 1,000 mg by mouth 2 (two) times daily with a meal.     .      . promethazine (PHENERGAN) 25 MG tablet Take 1 tablet (25 mg total) by mouth every 6 (six) hours as needed for nausea. < 1-2X/MO   .      Marland Kitchen DULoxetine (CYMBALTA) 30 MG capsule Take 30 mg by mouth daily.       Review of Systems     Objective:   Physical Exam  Constitutional: She is oriented to person, place, and time. She appears well-developed and well-nourished. No distress.  HENT:  Head: Normocephalic and atraumatic.  Mouth/Throat: Oropharynx is clear and moist. No oropharyngeal exudate.  Eyes: Pupils are equal, round, and reactive to light. No scleral icterus.  Neck: Normal range of motion. Neck supple.  Cardiovascular: Normal rate, regular rhythm and normal heart sounds.   Pulmonary/Chest: Effort normal and breath sounds normal. No respiratory distress.  Abdominal: Soft. Bowel sounds are normal. She exhibits no distension. There is no tenderness.  Musculoskeletal: She exhibits no edema.  Neurological: She is alert and oriented to person, place, and time.  Psychiatric: She has a normal mood and affect.  Vitals reviewed.         Assessment & Plan:

## 2014-04-28 NOTE — Patient Instructions (Signed)
DRINK WATER TO KEEP YOUR URINE LIGHT YELLOW.  FOLLOW A HIGH FIBER DIET. SEE INFO BELOW.  AVOID CONSTIPATION.  FOLLOW UP IN 6 MOS.   Hemorrhoids Hemorrhoids are dilated (enlarged) veins around the rectum. Sometimes clots will form in the veins. This makes them swollen and painful. These are called thrombosed hemorrhoids. Causes of hemorrhoids include:  Constipation.   Straining to have a bowel movement.   HEAVY LIFTING  HOME CARE INSTRUCTIONS  Eat a well balanced diet and drink 6 to 8 glasses of water every day to avoid constipation. You may also use a bulk laxative.   Avoid straining to have bowel movements.   Keep anal area dry and clean.   Do not use a donut shaped pillow or sit on the toilet for long periods. This increases blood pooling and pain.   Move your bowels when your body has the urge; this will require less straining and will decrease pain and pressure.   High-Fiber Diet A high-fiber diet changes your normal diet to include more whole grains, legumes, fruits, and vegetables. Changes in the diet involve replacing refined carbohydrates with unrefined foods. The calorie level of the diet is essentially unchanged. The Dietary Reference Intake (recommended amount) for adult males is 38 grams per day. For adult females, it is 25 grams per day. Pregnant and lactating women should consume 28 grams of fiber per day. Fiber is the intact part of a plant that is not broken down during digestion. Functional fiber is fiber that has been isolated from the plant to provide a beneficial effect in the body. PURPOSE  Increase stool bulk.   Ease and regulate bowel movements.   Lower cholesterol.  INDICATIONS THAT YOU NEED MORE FIBER  Constipation and hemorrhoids.   Uncomplicated diverticulosis (intestine condition) and irritable bowel syndrome.   Weight management.   As a protective measure against hardening of the arteries (atherosclerosis), diabetes, and cancer.    GUIDELINES FOR INCREASING FIBER IN THE DIET  Start adding fiber to the diet slowly. A gradual increase of about 5 more grams (2 slices of whole-wheat bread, 2 servings of most fruits or vegetables, or 1 bowl of high-fiber cereal) per day is best. Too rapid an increase in fiber may result in constipation, flatulence, and bloating.   Drink enough water and fluids to keep your urine clear or pale yellow. Water, juice, or caffeine-free drinks are recommended. Not drinking enough fluid may cause constipation.   Eat a variety of high-fiber foods rather than one type of fiber.   Try to increase your intake of fiber through using high-fiber foods rather than fiber pills or supplements that contain small amounts of fiber.   The goal is to change the types of food eaten. Do not supplement your present diet with high-fiber foods, but replace foods in your present diet.  INCLUDE A VARIETY OF FIBER SOURCES  Replace refined and processed grains with whole grains, canned fruits with fresh fruits, and incorporate other fiber sources. White rice, white breads, and most bakery goods contain little or no fiber.   Brown whole-grain rice, buckwheat oats, and many fruits and vegetables are all good sources of fiber. These include: broccoli, Brussels sprouts, cabbage, cauliflower, beets, sweet potatoes, white potatoes (skin on), carrots, tomatoes, eggplant, squash, berries, fresh fruits, and dried fruits.   Cereals appear to be the richest source of fiber. Cereal fiber is found in whole grains and bran. Bran is the fiber-rich outer coat of cereal grain, which is largely removed  in refining. In whole-grain cereals, the bran remains. In breakfast cereals, the largest amount of fiber is found in those with "bran" in their names. The fiber content is sometimes indicated on the label.   You may need to include additional fruits and vegetables each day.   In baking, for 1 cup white flour, you may use the following  substitutions:   1 cup whole-wheat flour minus 2 tablespoons.   1/2 cup white flour plus 1/2 cup whole-wheat flour.

## 2014-04-28 NOTE — Assessment & Plan Note (Signed)
S/p extrenal hemorrhoidectomy(MJ). Fully recovered. Sx resolved.  CONTINUE TO MONITOR SYMPTOMS. FOLLOW UP IN 6 MOS.  PLEASE CALL WITH QUESTIONS OR CONCERNS.

## 2014-04-28 NOTE — Progress Notes (Signed)
ON RECALL LIST  °

## 2014-04-29 ENCOUNTER — Ambulatory Visit (INDEPENDENT_AMBULATORY_CARE_PROVIDER_SITE_OTHER): Payer: Medicare Other | Admitting: Adult Health

## 2014-04-29 ENCOUNTER — Encounter: Payer: Self-pay | Admitting: Adult Health

## 2014-04-29 VITALS — BP 120/70 | Ht 66.0 in | Wt 165.0 lb

## 2014-04-29 DIAGNOSIS — B379 Candidiasis, unspecified: Secondary | ICD-10-CM | POA: Diagnosis not present

## 2014-04-29 DIAGNOSIS — N898 Other specified noninflammatory disorders of vagina: Secondary | ICD-10-CM | POA: Diagnosis not present

## 2014-04-29 LAB — POCT WET PREP (WET MOUNT)

## 2014-04-29 MED ORDER — FLUCONAZOLE 150 MG PO TABS: ORAL_TABLET | ORAL | Status: DC

## 2014-04-29 NOTE — Progress Notes (Signed)
cc'ed to pcp °

## 2014-04-29 NOTE — Progress Notes (Signed)
Subjective:     Patient ID: [name], female   DOB: 07-03-1965, 49 y.o.   MRN: [id number]  HPI [name] is a 49 year old black female in complaining of vaginal discharge with itching and burning, was on antibiotics and has used monistat without relief.  Review of Systems See HPI Reviewed past medical,surgical, social and family history. Reviewed medications and allergies.     Objective:   Physical Exam BP 120/70 mmHg  Ht 5\' 6"  (1.676 m)  Wt 165 lb (74.844 kg)  BMI 26.64 kg/m2   Skin warm and dry.Pelvic: external genitalia is normal in appearance, vagina: white discharge without odor, cervix:smooth and bulbous, uterus: normal size, shape and contour, non tender, no masses felt, adnexa: no masses or tenderness noted. Wet prep: + for yeast and few+WBCs.  Assessment:     Vaginal discharge  Yeast     Plan:     Rx Diflucan 150 mg #2 take 1 now and 1 in 3 days with 1 refill Follow up prn Review handout on yeast

## 2014-04-29 NOTE — Patient Instructions (Signed)

## 2014-05-03 DIAGNOSIS — E1165 Type 2 diabetes mellitus with hyperglycemia: Secondary | ICD-10-CM | POA: Diagnosis not present

## 2014-05-03 DIAGNOSIS — I10 Essential (primary) hypertension: Secondary | ICD-10-CM | POA: Diagnosis not present

## 2014-05-03 DIAGNOSIS — J410 Simple chronic bronchitis: Secondary | ICD-10-CM | POA: Diagnosis not present

## 2014-06-20 DIAGNOSIS — Z7982 Long term (current) use of aspirin: Secondary | ICD-10-CM | POA: Diagnosis not present

## 2014-06-20 DIAGNOSIS — Z8249 Family history of ischemic heart disease and other diseases of the circulatory system: Secondary | ICD-10-CM | POA: Diagnosis not present

## 2014-06-20 DIAGNOSIS — R0602 Shortness of breath: Secondary | ICD-10-CM | POA: Diagnosis not present

## 2014-06-20 DIAGNOSIS — Z9049 Acquired absence of other specified parts of digestive tract: Secondary | ICD-10-CM | POA: Diagnosis not present

## 2014-06-20 DIAGNOSIS — Z79899 Other long term (current) drug therapy: Secondary | ICD-10-CM | POA: Diagnosis not present

## 2014-06-20 DIAGNOSIS — Z8679 Personal history of other diseases of the circulatory system: Secondary | ICD-10-CM | POA: Diagnosis not present

## 2014-06-20 DIAGNOSIS — J00 Acute nasopharyngitis [common cold]: Secondary | ICD-10-CM | POA: Diagnosis not present

## 2014-06-20 DIAGNOSIS — I10 Essential (primary) hypertension: Secondary | ICD-10-CM | POA: Diagnosis not present

## 2014-06-20 DIAGNOSIS — Z91013 Allergy to seafood: Secondary | ICD-10-CM | POA: Diagnosis not present

## 2014-06-20 DIAGNOSIS — Z87898 Personal history of other specified conditions: Secondary | ICD-10-CM | POA: Diagnosis not present

## 2014-06-20 DIAGNOSIS — R05 Cough: Secondary | ICD-10-CM | POA: Diagnosis not present

## 2014-06-20 DIAGNOSIS — Z9889 Other specified postprocedural states: Secondary | ICD-10-CM | POA: Diagnosis not present

## 2014-06-20 DIAGNOSIS — E119 Type 2 diabetes mellitus without complications: Secondary | ICD-10-CM | POA: Diagnosis not present

## 2014-06-20 DIAGNOSIS — E780 Pure hypercholesterolemia: Secondary | ICD-10-CM | POA: Diagnosis not present

## 2014-06-20 DIAGNOSIS — R079 Chest pain, unspecified: Secondary | ICD-10-CM | POA: Diagnosis not present

## 2014-06-20 DIAGNOSIS — J45909 Unspecified asthma, uncomplicated: Secondary | ICD-10-CM | POA: Diagnosis not present

## 2014-06-20 DIAGNOSIS — Z9851 Tubal ligation status: Secondary | ICD-10-CM | POA: Diagnosis not present

## 2014-06-20 DIAGNOSIS — R0789 Other chest pain: Secondary | ICD-10-CM | POA: Diagnosis not present

## 2014-06-23 DIAGNOSIS — J4520 Mild intermittent asthma, uncomplicated: Secondary | ICD-10-CM | POA: Diagnosis not present

## 2014-06-30 DIAGNOSIS — J441 Chronic obstructive pulmonary disease with (acute) exacerbation: Secondary | ICD-10-CM | POA: Diagnosis not present

## 2014-06-30 DIAGNOSIS — D869 Sarcoidosis, unspecified: Secondary | ICD-10-CM | POA: Diagnosis not present

## 2014-07-05 DIAGNOSIS — D869 Sarcoidosis, unspecified: Secondary | ICD-10-CM | POA: Diagnosis not present

## 2014-07-05 DIAGNOSIS — E119 Type 2 diabetes mellitus without complications: Secondary | ICD-10-CM | POA: Diagnosis not present

## 2014-07-05 DIAGNOSIS — H04123 Dry eye syndrome of bilateral lacrimal glands: Secondary | ICD-10-CM | POA: Diagnosis not present

## 2014-07-05 DIAGNOSIS — I10 Essential (primary) hypertension: Secondary | ICD-10-CM | POA: Diagnosis not present

## 2014-07-05 DIAGNOSIS — Z87891 Personal history of nicotine dependence: Secondary | ICD-10-CM | POA: Diagnosis not present

## 2014-07-05 DIAGNOSIS — H02203 Unspecified lagophthalmos right eye, unspecified eyelid: Secondary | ICD-10-CM | POA: Diagnosis not present

## 2014-07-05 DIAGNOSIS — H02206 Unspecified lagophthalmos left eye, unspecified eyelid: Secondary | ICD-10-CM | POA: Diagnosis not present

## 2014-07-15 ENCOUNTER — Emergency Department (HOSPITAL_COMMUNITY)
Admission: EM | Admit: 2014-07-15 | Discharge: 2014-07-16 | Disposition: A | Discharge status: Home / Self Care | Payer: Medicare Other | Attending: Emergency Medicine | Admitting: Emergency Medicine

## 2014-07-15 ENCOUNTER — Encounter (HOSPITAL_COMMUNITY): Payer: Self-pay

## 2014-07-15 DIAGNOSIS — K5900 Constipation, unspecified: Secondary | ICD-10-CM | POA: Diagnosis not present

## 2014-07-15 DIAGNOSIS — J45909 Unspecified asthma, uncomplicated: Secondary | ICD-10-CM | POA: Insufficient documentation

## 2014-07-15 DIAGNOSIS — K915 Postcholecystectomy syndrome: Secondary | ICD-10-CM | POA: Diagnosis not present

## 2014-07-15 DIAGNOSIS — Z9889 Other specified postprocedural states: Secondary | ICD-10-CM | POA: Diagnosis not present

## 2014-07-15 DIAGNOSIS — E1340 Other specified diabetes mellitus with diabetic neuropathy, unspecified: Secondary | ICD-10-CM | POA: Insufficient documentation

## 2014-07-15 DIAGNOSIS — Z87891 Personal history of nicotine dependence: Secondary | ICD-10-CM | POA: Insufficient documentation

## 2014-07-15 DIAGNOSIS — Z7982 Long term (current) use of aspirin: Secondary | ICD-10-CM | POA: Insufficient documentation

## 2014-07-15 DIAGNOSIS — Z862 Personal history of diseases of the blood and blood-forming organs and certain disorders involving the immune mechanism: Secondary | ICD-10-CM | POA: Diagnosis not present

## 2014-07-15 DIAGNOSIS — Z3202 Encounter for pregnancy test, result negative: Secondary | ICD-10-CM | POA: Insufficient documentation

## 2014-07-15 DIAGNOSIS — R1030 Lower abdominal pain, unspecified: Secondary | ICD-10-CM | POA: Diagnosis not present

## 2014-07-15 DIAGNOSIS — Z8742 Personal history of other diseases of the female genital tract: Secondary | ICD-10-CM | POA: Diagnosis not present

## 2014-07-15 DIAGNOSIS — Z9851 Tubal ligation status: Secondary | ICD-10-CM | POA: Insufficient documentation

## 2014-07-15 DIAGNOSIS — I10 Essential (primary) hypertension: Secondary | ICD-10-CM | POA: Insufficient documentation

## 2014-07-15 DIAGNOSIS — Z79899 Other long term (current) drug therapy: Secondary | ICD-10-CM | POA: Diagnosis not present

## 2014-07-15 DIAGNOSIS — K219 Gastro-esophageal reflux disease without esophagitis: Secondary | ICD-10-CM | POA: Insufficient documentation

## 2014-07-15 NOTE — ED Notes (Signed)
Pt reports suprapubic pain for a couple of days, frequency with urination.

## 2014-07-16 LAB — URINALYSIS, ROUTINE W REFLEX MICROSCOPIC
BILIRUBIN URINE: NEGATIVE
Glucose, UA: NEGATIVE mg/dL
Hgb urine dipstick: NEGATIVE
Ketones, ur: NEGATIVE mg/dL
Leukocytes, UA: NEGATIVE
Nitrite: NEGATIVE
PH: 5.5 (ref 5.0–8.0)
Protein, ur: NEGATIVE mg/dL
Specific Gravity, Urine: 1.01 (ref 1.005–1.030)
Urobilinogen, UA: 0.2 mg/dL (ref 0.0–1.0)

## 2014-07-16 LAB — PREGNANCY, URINE: PREG TEST UR: NEGATIVE

## 2014-07-16 MED ORDER — DOCUSATE SODIUM 100 MG PO CAPS: 100.0000 mg | ORAL_CAPSULE | Freq: Two times a day (BID) | ORAL | Status: DC

## 2014-07-16 MED ORDER — HYDROCODONE-ACETAMINOPHEN 5-325 MG PO TABS: 1.0000 | ORAL_TABLET | Freq: Four times a day (QID) | ORAL | Status: DC | PRN

## 2014-07-16 NOTE — ED Provider Notes (Signed)
CSN: [id number]     Arrival date & time 07/15/14  2331 History  This chart was scribed for Erica Biles, MD by Tula Nakayama, ED Scribe. This patient was seen in room [facility] and the patient's care was started at 12:21 AM.    Chief Complaint  Patient presents with  . Abdominal Pain    The history is provided by the patient. No language interpreter was used.    HPI Comments: [name] is a 49 y.o. female with a history of cholecystectomy who presents to the Emergency Department complaining of intermittent, moderate, sharp suprapubic pain that stated yesterday. She states frequency with urination as an associated symptom. Pt has a history of similar symptoms, which were previously caused by her hemorrhoids. She had a hemorrhoidectomy in 12/2013 and has not had any recurrences of similar pain until today. Pt denies nausea, vomiting, fever, chills, diarrhea, blood in stool, vaginal discharge and vaginal bleeding as associated symptoms.  Past Medical History  Diagnosis Date  . Sarcoidosis   . Diabetes mellitus   . Hypertension   . Asthma   . Abnormal uterine bleeding (AUB) 11/26/2012  . BV (bacterial vaginosis) 11/26/2012  . Fatigue 12/24/2012  . Other and unspecified ovarian cyst 01/01/2013  . Fibroids 01/01/2013  . Hemorrhoid 01/01/2013  . Vaginal discharge 02/12/2013    +yeast  . Constipation   . GERD (gastroesophageal reflux disease)   . Neuropathy due to secondary diabetes    Past Surgical History  Procedure Laterality Date  . Tubal ligation    . Endometrial ablation    . Cholecystectomy    . Hemorrhoid surgery      banding  . Colonoscopy N/A 08/20/2013    Dr. Oneida Alar: normal mucosa in terminal ileum, mild diverticulosis in ascending colon, moderate sized hemorrhoids s/p banding  . Hemorrhoid banding  08/20/2013    Procedure: HEMORRHOID BANDING;  Surgeon: Danie Binder, MD;  Location: [facility];  Service: Endoscopy;;  . Hemorrhoid surgery N/A 12/25/2013     Procedure: EXTENSIVE HEMORRHOIDECTOMY;  Surgeon: Jamesetta So, MD;  Location: [facility] ORS;  Service: General;  Laterality: N/A;   Family History  Problem Relation Age of Onset  . Diabetes Father   . Cancer Mother     throat  . Cancer Brother     lung  . Colon cancer Neg Hx   . Breast cancer Cousin    History  Substance Use Topics  . Smoking status: Former Smoker -- 1.00 packs/day for 6 years    Types: Cigarettes    Quit date: 12/22/2003  . Smokeless tobacco: Never Used     Comment: Quit  5 years  . Alcohol Use: Yes     Comment: beer once a month   OB History    Gravida Para Term Preterm AB TAB SAB Ectopic Multiple Living   6 2   4  2   2      Review of Systems  Constitutional: Negative for fever and chills.  Gastrointestinal: Positive for abdominal pain. Negative for nausea, vomiting, diarrhea and blood in stool.  Genitourinary: Positive for frequency. Negative for vaginal bleeding and vaginal discharge.   Allergies  Shellfish allergy and Linzess  Home Medications   Prior to Admission medications   Medication Sig Start Date End Date Taking? Authorizing Provider  aspirin EC 325 MG tablet Take 325 mg by mouth daily.   Yes Historical Provider, MD  atorvastatin (LIPITOR) 20 MG tablet Take 20 mg by mouth at bedtime.  Yes Historical Provider, MD  benazepril (LOTENSIN) 10 MG tablet Take 10 mg by mouth daily.    Yes Historical Provider, MD  fenofibrate (TRICOR) 145 MG tablet Take 145 mg by mouth at bedtime.    Yes Historical Provider, MD  ibuprofen (ADVIL,MOTRIN) 800 MG tablet TAKE ONE TABLET BY MOUTH EVERY 8 HOURS AS NEEDED 01/11/14  Yes Estill Dooms, NP  metFORMIN (GLUCOPHAGE) 1000 MG tablet Take 1,000 mg by mouth 2 (two) times daily with a meal.    Yes Historical Provider, MD  promethazine (PHENERGAN) 25 MG tablet Take 1 tablet (25 mg total) by mouth every 6 (six) hours as needed for nausea. 01/03/13  Yes Nat Christen, MD  ranitidine (ZANTAC) 150 MG tablet Take 300 mg by  mouth 2 (two) times daily.    Yes Historical Provider, MD  docusate sodium (COLACE) 100 MG capsule Take 1 capsule (100 mg total) by mouth every 12 (twelve) hours. 07/16/14   Erica Biles, MD  fluconazole (DIFLUCAN) 150 MG tablet Take 1 now and 1 in 3 days 04/29/14   Estill Dooms, NP  HYDROcodone-acetaminophen (NORCO/VICODIN) 5-325 MG per tablet Take 1 tablet by mouth every 6 (six) hours as needed. 07/16/14   Fender Herder, MD   BP 110/60 mmHg  Pulse 71  Resp 18  Ht 5\' 6"  (1.676 m)  Wt 170 lb (77.111 kg)  BMI 27.45 kg/m2  SpO2 100%  LMP 02/13/2014 (Exact Date) Physical Exam  Constitutional: She is oriented to person, place, and time. She appears well-developed and well-nourished. No distress.  HENT:  Head: Normocephalic and atraumatic.  Eyes: Conjunctivae and EOM are normal.  Neck: Neck supple. No tracheal deviation present.  Cardiovascular: Normal rate, regular rhythm and normal heart sounds.   Pulmonary/Chest: Effort normal. No respiratory distress.  Abdominal: Soft. Bowel sounds are normal. There is tenderness.  Tenderness to suprapubic region with no rebound or guarding; no flank tenderness; negative Psoas sign.  RECTAL EXAM performed with a chaperon and there is no evidence of abscessed or thrombosed external hemorrhoid, no anal fissures, no rectal irritation. The rectal exam was tender, but there was no fluctuance noted.  Neurological: She is alert and oriented to person, place, and time. No cranial nerve deficit. She exhibits normal muscle tone. Coordination normal.  Skin: Skin is warm and dry.  Psychiatric: She has a normal mood and affect. Her behavior is normal.  Nursing note and vitals reviewed.   ED Course  Procedures   DIAGNOSTIC STUDIES: Oxygen Saturation is 100% on RA, normal by my interpretation.    COORDINATION OF CARE: 12:28 AM Discussed treatment plan with pt at bedside and pt agreed to plan.   Labs Review Labs Reviewed  URINALYSIS, ROUTINE W REFLEX  MICROSCOPIC  PREGNANCY, URINE    Imaging Review No results found.   EKG Interpretation None      MDM   Final diagnoses:  Lower abdominal pain    I personally performed the services described in this documentation, which was scribed in my presence. The recorded information has been reviewed and is accurate.  Pt comes in with cc of lower abdominal pain, that radiates to her back. The exam is non peritoneal. UA is clear. She has no dysuria, hematuria anyways. We will get cultures given she is diabetic and she has some frequency. Rectal exam reveals no abscess. Will d.c. Asked her to see GI again for optimal evaluation of the hemorrhoids. Based on the hx, exam - i really cant think of any other  source of infection. Particularly, she has no hx current pelvic organ dysfunction, no vaginal d/c, bleeding and no risk factors for STD.  Erica Biles, MD 07/16/14 [phone]

## 2014-07-16 NOTE — Discharge Instructions (Signed)
Please see the GI doctor for further evaluation. Take the medicine for severe pain only.  Hemorrhoids Hemorrhoids are swollen veins around the rectum or anus. There are two types of hemorrhoids:   Internal hemorrhoids. These occur in the veins just inside the rectum. They may poke through to the outside and become irritated and painful.  External hemorrhoids. These occur in the veins outside the anus and can be felt as a painful swelling or hard lump near the anus. CAUSES  Pregnancy.   Obesity.   Constipation or diarrhea.   Straining to have a bowel movement.   Sitting for long periods on the toilet.  Heavy lifting or other activity that caused you to strain.  Anal intercourse. SYMPTOMS   Pain.   Anal itching or irritation.   Rectal bleeding.   Fecal leakage.   Anal swelling.   One or more lumps around the anus.  DIAGNOSIS  Your caregiver may be able to diagnose hemorrhoids by visual examination. Other examinations or tests that may be performed include:   Examination of the rectal area with a gloved hand (digital rectal exam).   Examination of anal canal using a small tube (scope).   A blood test if you have lost a significant amount of blood.  A test to look inside the colon (sigmoidoscopy or colonoscopy). TREATMENT Most hemorrhoids can be treated at home. However, if symptoms do not seem to be getting better or if you have a lot of rectal bleeding, your caregiver may perform a procedure to help make the hemorrhoids get smaller or remove them completely. Possible treatments include:   Placing a rubber band at the base of the hemorrhoid to cut off the circulation (rubber band ligation).   Injecting a chemical to shrink the hemorrhoid (sclerotherapy).   Using a tool to burn the hemorrhoid (infrared light therapy).   Surgically removing the hemorrhoid (hemorrhoidectomy).   Stapling the hemorrhoid to block blood flow to the tissue (hemorrhoid  stapling).  HOME CARE INSTRUCTIONS   Eat foods with fiber, such as whole grains, beans, nuts, fruits, and vegetables. Ask your doctor about taking products with added fiber in them (fibersupplements).  Increase fluid intake. Drink enough water and fluids to keep your urine clear or pale yellow.   Exercise regularly.   Go to the bathroom when you have the urge to have a bowel movement. Do not wait.   Avoid straining to have bowel movements.   Keep the anal area dry and clean. Use wet toilet paper or moist towelettes after a bowel movement.   Medicated creams and suppositories may be used or applied as directed.   Only take over-the-counter or prescription medicines as directed by your caregiver.   Take warm sitz baths for 15-20 minutes, 3-4 times a day to ease pain and discomfort.   Place ice packs on the hemorrhoids if they are tender and swollen. Using ice packs between sitz baths may be helpful.   Put ice in a plastic bag.   Place a towel between your skin and the bag.   Leave the ice on for 15-20 minutes, 3-4 times a day.   Do not use a donut-shaped pillow or sit on the toilet for long periods. This increases blood pooling and pain.  SEEK MEDICAL CARE IF:  You have increasing pain and swelling that is not controlled by treatment or medicine.  You have uncontrolled bleeding.  You have difficulty or you are unable to have a bowel movement.  You have  pain or inflammation outside the area of the hemorrhoids. MAKE SURE YOU:  Understand these instructions.  Will watch your condition.  Will get help right away if you are not doing well or get worse. Document Released: 03/16/2000 Document Revised: 03/05/2012 Document Reviewed: 01/22/2012 [facility] Patient Information 2015 [place], [place]. This information is not intended to replace advice given to you by your health care provider. Make sure you discuss any questions you have with your health care provider.

## 2014-07-17 LAB — URINE CULTURE: SPECIAL REQUESTS: NORMAL

## 2014-08-18 ENCOUNTER — Encounter: Payer: Self-pay | Admitting: Gastroenterology

## 2014-08-18 ENCOUNTER — Ambulatory Visit (INDEPENDENT_AMBULATORY_CARE_PROVIDER_SITE_OTHER): Payer: Medicare Other | Admitting: Gastroenterology

## 2014-08-18 VITALS — BP 104/64 | HR 73 | Temp 97.1°F | Ht 66.0 in | Wt 179.6 lb

## 2014-08-18 DIAGNOSIS — K6289 Other specified diseases of anus and rectum: Secondary | ICD-10-CM | POA: Diagnosis not present

## 2014-08-18 NOTE — Assessment & Plan Note (Signed)
Most likely due to incomplete evacuation.  DRINK WATER TO KEEP YOUR URINE LIGHT YELLOW. Add IBGARD 2 WITH 8 OZ OF WATER EVERY MORNING. CUT DOWN TO ONE IF YOU HAVE DIARRHEA. ADD ONE OR TWO COLACE WITH STIMULANT LAXATIVE AT BEDTIME. EAT FIBER. AVOID ITEMS THAT CAUSE BLOATING & GAS. FOLLOW UP IN 3 MOS.

## 2014-08-18 NOTE — Progress Notes (Signed)
cc'ed to pcp °

## 2014-08-18 NOTE — Assessment & Plan Note (Signed)
PRESSURE MOST LIKELY DUE TO INCOMPLETE EVACUATION  CONTINUE YOUR WEIGHT LOSS EFFORTS. LOSE 10 LBS. DRINK WATER TO KEEP YOUR URINE LIGHT YELLOW. Add IBGARD 2 WITH 8 OZ OF WATER EVERY MORNING. CUT DOWN TO ONE IF YOU HAVE DIARRHEA. ADD ONE OR TWO COLACE WITH STIMULANT LAXATIVE AT BEDTIME. EAT FIBER. AVOID ITEMS THAT CAUSE BLOATING & GAS. CONTINUE PRN LINZESS. AWAIT LOWER DOSE OF LINZESS. DISCUSSED WITH PATIENT. FOLLOW UP IN 3 MOS.

## 2014-08-18 NOTE — Patient Instructions (Signed)
CONTINUE YOUR WEIGHT LOSS EFFORTS. LOSE 10 LBS.  DRINK WATER TO KEEP YOUR URINE LIGHT YELLOW.  Add IBGARD 2 WITH 8 OZ OF WATER EVERY MORNING. CUT DOWN TO ONE IF YOU HAVE DIARRHEA.  ADD ONE OR TWO COLACE WITH STIMULANT LAXATIVE AT BEDTIME.  EAT FIBER. AVOID ITEMS THAT CAUSE BLOATING & GAS. SEE INFO BELOW.  CONTINUE LINZESS AS NEEDED A LOWER DOSE WILL BE AVAILABLE IN 6 MOS.   FOLLOW UP IN 3 MOS.  High-Fiber Diet A high-fiber diet changes your normal diet to include more whole grains, legumes, fruits, and vegetables. Changes in the diet involve replacing refined carbohydrates with unrefined foods. The calorie level of the diet is essentially unchanged. The Dietary Reference Intake (recommended amount) for adult males is 38 grams per day. For adult females, it is 25 grams per day. Pregnant and lactating women should consume 28 grams of fiber per day. Fiber is the intact part of a plant that is not broken down during digestion. Functional fiber is fiber that has been isolated from the plant to provide a beneficial effect in the body. PURPOSE  Increase stool bulk.   Ease and regulate bowel movements.   Lower cholesterol.  INDICATIONS THAT YOU NEED MORE FIBER  Constipation and hemorrhoids.   Uncomplicated diverticulosis (intestine condition) and irritable bowel syndrome.   Weight management.   As a protective measure against hardening of the arteries (atherosclerosis), diabetes, and cancer.   GUIDELINES FOR INCREASING FIBER IN THE DIET  Start adding fiber to the diet slowly. A gradual increase of about 5 more grams (2 slices of whole-wheat bread, 2 servings of most fruits or vegetables, or 1 bowl of high-fiber cereal) per day is best. Too rapid an increase in fiber may result in constipation, flatulence, and bloating.   Drink enough water and fluids to keep your urine clear or pale yellow. Water, juice, or caffeine-free drinks are recommended. Not drinking enough fluid may cause  constipation.   Eat a variety of high-fiber foods rather than one type of fiber.   Try to increase your intake of fiber through using high-fiber foods rather than fiber pills or supplements that contain small amounts of fiber.   The goal is to change the types of food eaten. Do not supplement your present diet with high-fiber foods, but replace foods in your present diet.  INCLUDE A VARIETY OF FIBER SOURCES  Replace refined and processed grains with whole grains, canned fruits with fresh fruits, and incorporate other fiber sources. White rice, white breads, and most bakery goods contain little or no fiber.   Brown whole-grain rice, buckwheat oats, and many fruits and vegetables are all good sources of fiber. These include: broccoli, Brussels sprouts, cabbage, cauliflower, beets, sweet potatoes, white potatoes (skin on), carrots, tomatoes, eggplant, squash, berries, fresh fruits, and dried fruits.   Cereals appear to be the richest source of fiber. Cereal fiber is found in whole grains and bran. Bran is the fiber-rich outer coat of cereal grain, which is largely removed in refining. In whole-grain cereals, the bran remains. In breakfast cereals, the largest amount of fiber is found in those with "bran" in their names. The fiber content is sometimes indicated on the label.   You may need to include additional fruits and vegetables each day.   In baking, for 1 cup white flour, you may use the following substitutions:   1 cup whole-wheat flour minus 2 tablespoons.   1/2 cup white flour plus 1/2 cup whole-wheat flour.

## 2014-08-18 NOTE — Assessment & Plan Note (Signed)
EXTENSIVE HEMORRHOIDECTOMY IN SEP 2015.  CONTINUE TO MONITOR FOR REOCCURRENCE AVOID CONSTIPATION OR STRAINING.

## 2014-08-18 NOTE — Progress Notes (Signed)
Subjective:    Patient ID: [name], female    DOB: October 25, 1965, 49 y.o.   MRN: [id number]  Stoney Bang A, MD   HPI Butt pain- 2x/week-PRESSURE. BMs: WAS DAILY BUT NOW BACK LIKE IT WAS. Feels urge to go but nothing out. Difficulty passing stool. Drinking water. Tries to eat fiber. 2x/week-in the middle of where she has a fat pack. WAS ON USING LINZESS PRN(ONCE A WEEK)  PT DENIES FEVER, CHILLS, HEMATOCHEZIA, HEMATEMESIS, nausea, vomiting, melena, diarrhea, CHEST PAIN, SHORTNESS OF BREATH, problems swallowing, or heartburn or indigestion.   Past Medical History  Diagnosis Date  . Sarcoidosis   . Diabetes mellitus   . Hypertension   . Asthma   . Abnormal uterine bleeding (AUB) 11/26/2012  . BV (bacterial vaginosis) 11/26/2012  . Fatigue 12/24/2012  . Other and unspecified ovarian cyst 01/01/2013  . Fibroids 01/01/2013  . Hemorrhoid 01/01/2013  . Vaginal discharge 02/12/2013    +yeast  . Constipation   . GERD (gastroesophageal reflux disease)   . Neuropathy due to secondary diabetes    Past Surgical History  Procedure Laterality Date  . Tubal ligation    . Endometrial ablation    . Cholecystectomy    . Hemorrhoid surgery      banding  . Colonoscopy N/A 08/20/2013    Dr. Oneida Alar: normal mucosa in terminal ileum, mild diverticulosis in ascending colon, moderate sized hemorrhoids s/p banding  . Hemorrhoid banding  08/20/2013    Procedure: HEMORRHOID BANDING;  Surgeon: Danie Binder, MD;  Location: [facility];  Service: Endoscopy;;  . Hemorrhoid surgery N/A 12/25/2013    Procedure: EXTENSIVE HEMORRHOIDECTOMY;  Surgeon: Jamesetta So, MD;  Location: [facility];  Service: General;  Laterality: N/A;   Allergies  Allergen Reactions  . Shellfish Allergy Anaphylaxis  . Linzess [Linaclotide]     290 MCG CAUSED DIARRHEA   Current Outpatient Prescriptions  Medication Sig Dispense Refill  . aspirin EC 325 MG tablet Take 325 mg by mouth daily.    Marland Kitchen atorvastatin (LIPITOR) 20 MG  tablet Take 20 mg by mouth at bedtime.     . benazepril (LOTENSIN) 10 MG tablet Take 10 mg by mouth daily.     . fenofibrate (TRICOR) 145 MG tablet Take 145 mg by mouth at bedtime.     Marland Kitchen ibuprofen (ADVIL,MOTRIN) 800 MG tablet TAKE ONE TABLET BY MOUTH EVERY 8 HOURS AS NEEDED    . metFORMIN (GLUCOPHAGE) 1000 MG tablet Take 1,000 mg by mouth 2 (two) times daily with a meal.     .      .      . HYDROcodone-acetaminophen (NORCO/VICODIN) 5-325 MG per tablet Take 1 tablet by mouth every 6 (six) hours as needed.  RARE   . promethazine (PHENERGAN) 25 MG tablet Take 1 tablet (25 mg total) by mouth every 6 (six) hours as needed for nausea. 1-2X/MO   . ranitidine (ZANTAC) 150 MG tablet Take 300 mg by mouth 2 (two) times daily.  PRN      Review of Systems     Objective:   Physical Exam  Constitutional: She is oriented to person, place, and time. She appears well-developed and well-nourished. No distress.  HENT:  Head: Normocephalic and atraumatic.  Mouth/Throat: Oropharynx is clear and moist. No oropharyngeal exudate.  Eyes: Pupils are equal, round, and reactive to light. No scleral icterus.  Neck: Normal range of motion. Neck supple.  Cardiovascular: Normal rate, regular rhythm and normal heart sounds.   Pulmonary/Chest: Effort normal  and breath sounds normal. No respiratory distress.  Abdominal: Soft. Bowel sounds are normal. She exhibits no distension. There is no tenderness.  Musculoskeletal: She exhibits no edema.  Lymphadenopathy:    She has no cervical adenopathy.  Neurological: She is alert and oriented to person, place, and time.  NO FOCAL DEFICITS   Psychiatric: She has a normal mood and affect.  Vitals reviewed.         Assessment & Plan:

## 2014-08-18 NOTE — Progress Notes (Signed)
ON RECALL LIST  °

## 2014-08-30 ENCOUNTER — Encounter (HOSPITAL_COMMUNITY): Payer: Self-pay | Admitting: *Deleted

## 2014-08-30 ENCOUNTER — Emergency Department (HOSPITAL_COMMUNITY)
Admission: EM | Admit: 2014-08-30 | Discharge: 2014-08-30 | Discharge status: Against Medical Advice | Payer: Medicare Other | Attending: Emergency Medicine | Admitting: Emergency Medicine

## 2014-08-30 ENCOUNTER — Emergency Department (HOSPITAL_COMMUNITY): Payer: Medicare Other

## 2014-08-30 DIAGNOSIS — E1340 Other specified diabetes mellitus with diabetic neuropathy, unspecified: Secondary | ICD-10-CM | POA: Insufficient documentation

## 2014-08-30 DIAGNOSIS — M542 Cervicalgia: Secondary | ICD-10-CM | POA: Diagnosis not present

## 2014-08-30 DIAGNOSIS — M546 Pain in thoracic spine: Secondary | ICD-10-CM | POA: Insufficient documentation

## 2014-08-30 DIAGNOSIS — K219 Gastro-esophageal reflux disease without esophagitis: Secondary | ICD-10-CM | POA: Insufficient documentation

## 2014-08-30 DIAGNOSIS — Z87891 Personal history of nicotine dependence: Secondary | ICD-10-CM | POA: Insufficient documentation

## 2014-08-30 DIAGNOSIS — Z7982 Long term (current) use of aspirin: Secondary | ICD-10-CM | POA: Diagnosis not present

## 2014-08-30 DIAGNOSIS — I10 Essential (primary) hypertension: Secondary | ICD-10-CM | POA: Diagnosis not present

## 2014-08-30 DIAGNOSIS — Z87448 Personal history of other diseases of urinary system: Secondary | ICD-10-CM | POA: Diagnosis not present

## 2014-08-30 DIAGNOSIS — Z79899 Other long term (current) drug therapy: Secondary | ICD-10-CM | POA: Insufficient documentation

## 2014-08-30 DIAGNOSIS — Z862 Personal history of diseases of the blood and blood-forming organs and certain disorders involving the immune mechanism: Secondary | ICD-10-CM | POA: Diagnosis not present

## 2014-08-30 DIAGNOSIS — Z8742 Personal history of other diseases of the female genital tract: Secondary | ICD-10-CM | POA: Diagnosis not present

## 2014-08-30 DIAGNOSIS — J45909 Unspecified asthma, uncomplicated: Secondary | ICD-10-CM | POA: Diagnosis not present

## 2014-08-30 DIAGNOSIS — Z86018 Personal history of other benign neoplasm: Secondary | ICD-10-CM | POA: Diagnosis not present

## 2014-08-30 NOTE — ED Notes (Signed)
Post neck pain, no known injury, Increased pain with movement.

## 2014-08-30 NOTE — ED Provider Notes (Signed)
CSN: [id number]     Arrival date & time 08/30/14  2039 History   First MD Initiated Contact with Patient 08/30/14 2100     Chief Complaint  Patient presents with  . Neck Pain     (Consider location/radiation/quality/duration/timing/severity/associated sxs/prior Treatment) Patient is a 49 y.o. female presenting with back pain. The history is provided by the patient.  Back Pain Location:  Thoracic spine Quality:  Aching Radiates to:  L shoulder and R shoulder (cervical spine) Pain severity:  Severe Pain is:  Same all the time Onset quality:  Gradual Duration:  1 month Timing:  Constant Progression:  Worsening Chronicity:  New Relieved by:  Nothing Worsened by:  Movement Ineffective treatments:  Muscle relaxants and NSAIDs  Esmeralda Malay is a 49 y.o. female who presents to the ED with back painThe pain is worse with movement. The pain starts at the lower part of the cervical spine and extends to the thoracic area. The pain radiates to the shoulders. She does not remember any injury. Patient states she has been given muscle relaxants but she does not take them because they don't do anything but make her feel bad. The ibuprofen only helps for a while.   Past Medical History  Diagnosis Date  . Sarcoidosis   . Diabetes mellitus   . Hypertension   . Asthma   . Abnormal uterine bleeding (AUB) 11/26/2012  . BV (bacterial vaginosis) 11/26/2012  . Fatigue 12/24/2012  . Other and unspecified ovarian cyst 01/01/2013  . Fibroids 01/01/2013  . Hemorrhoid 01/01/2013  . Vaginal discharge 02/12/2013    +yeast  . Constipation   . GERD (gastroesophageal reflux disease)   . Neuropathy due to secondary diabetes   . 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 Tubal ligation    . Endometrial ablation    . Cholecystectomy    . Hemorrhoid surgery      banding  . Colonoscopy N/A 08/20/2013    Dr. Oneida Alar: normal mucosa in terminal ileum, mild  diverticulosis in ascending colon, moderate sized hemorrhoids s/p banding  . Hemorrhoid banding  08/20/2013    Procedure: HEMORRHOID BANDING;  Surgeon: Danie Binder, MD;  Location: [facility];  Service: Endoscopy;;  . Hemorrhoid surgery N/A 12/25/2013    Procedure: EXTENSIVE HEMORRHOIDECTOMY;  Surgeon: Jamesetta So, MD;  Location: [facility];  Service: General;  Laterality: N/A;   Family History  Problem Relation Age of Onset  . Diabetes Father   . Cancer Mother     throat  . Cancer Brother     lung  . Colon cancer Neg Hx   . Breast cancer Cousin    History  Substance Use Topics  . Smoking status: Former Smoker -- 1.00 packs/day for 6 years    Types: Cigarettes    Quit date: 12/22/2003  . Smokeless tobacco: Never Used     Comment: Quit  5 years  . Alcohol Use: Yes     Comment: beer once a month   OB History    Gravida Para Term Preterm AB TAB SAB Ectopic Multiple Living   6 2   4  2   2      Review of Systems  Musculoskeletal: Positive for back pain and neck pain.  all other systems negative    Allergies  Shellfish allergy and Linzess  Home Medications   Prior to Admission medications   Medication Sig Start Date End Date Taking? Authorizing Provider  aspirin EC 325 MG tablet Take 325 mg by mouth daily.   Yes Historical Provider, MD  atorvastatin (LIPITOR) 20 MG tablet Take 20 mg by mouth at bedtime.    Yes Historical Provider, MD  benazepril (LOTENSIN) 10 MG tablet Take 10 mg by mouth daily.    Yes Historical Provider, MD  fenofibrate (TRICOR) 145 MG tablet Take 145 mg by mouth at bedtime.    Yes Historical Provider, MD  ibuprofen (ADVIL,MOTRIN) 800 MG tablet TAKE ONE TABLET BY MOUTH EVERY 8 HOURS AS NEEDED 01/11/14  Yes Estill Dooms, NP  metFORMIN (GLUCOPHAGE) 1000 MG tablet Take 1,000 mg by mouth 2 (two) times daily with a meal.    Yes Historical Provider, MD  promethazine (PHENERGAN) 25 MG tablet Take 1 tablet (25 mg total) by mouth every 6 (six) hours as  needed for nausea. 01/03/13  Yes Nat Christen, MD  ranitidine (ZANTAC) 150 MG tablet Take 300 mg by mouth 2 (two) times daily.    Yes Historical Provider, MD  docusate sodium (COLACE) 100 MG capsule Take 1 capsule (100 mg total) by mouth every 12 (twelve) hours. Patient not taking: Reported on 08/18/2014 07/16/14   [name], MD  fluconazole (DIFLUCAN) 150 MG tablet Take 1 now and 1 in 3 days Patient not taking: Reported on 08/18/2014 04/29/14   Estill Dooms, NP  HYDROcodone-acetaminophen (NORCO/VICODIN) 5-325 MG per tablet Take 1 tablet by mouth every 6 (six) hours as needed. Patient not taking: Reported on 08/18/2014 07/16/14   [name], MD   BP 112/69 mmHg  Pulse 87  Temp(Src) 98.3 F (36.8 C) (Oral)  Resp 18  Ht 5\' 6"  (1.676 m)  Wt 179 lb (81.194 kg)  BMI 28.91 kg/m2  SpO2 100% Physical Exam  Constitutional: She is oriented to person, place, and time. She appears well-developed and well-nourished. No distress.  HENT:  Head: Normocephalic.  Eyes: EOM are normal.  Neck: Neck supple.  Cardiovascular: Normal rate and regular rhythm.   Pulmonary/Chest: Effort normal and breath sounds normal.  Musculoskeletal:       Back:  Patient is tender to the base of the cervical spine and extends to the thoracic area. Pain radiates to the shoulders. Grips strong and equal, adequate circulation, good touch sensation.   Neurological: She is alert and oriented to person, place, and time. She has normal strength. No cranial nerve deficit or sensory deficit. Gait normal.  Reflex Scores:      Tricep reflexes are 2+ on the right side and 2+ on the left side.      Bicep reflexes are 2+ on the right side and 2+ on the left side. Skin: Skin is warm and dry.  Psychiatric: She has a normal mood and affect. Her behavior is normal.  Nursing note and vitals reviewed.   ED Course  Procedures   MDM  49 y.o. female with neck and back pain that has been ongoing x 1 month. Patient had requested  imaging and it was ordered. Patient decided to leave AMA. States she will get her MD to order MRI.  Final diagnoses:  Cervical pain (neck)  Bilateral thoracic back pain       Ashley Murrain, NP 08/30/14 2222  [name], MD 09/02/14 1133

## 2014-09-07 ENCOUNTER — Emergency Department (HOSPITAL_COMMUNITY)
Admission: EM | Admit: 2014-09-07 | Discharge: 2014-09-08 | Disposition: A | Discharge status: Home / Self Care | Payer: Medicare Other | Attending: Emergency Medicine | Admitting: Emergency Medicine

## 2014-09-07 ENCOUNTER — Emergency Department (HOSPITAL_COMMUNITY): Payer: Medicare Other

## 2014-09-07 ENCOUNTER — Encounter (HOSPITAL_COMMUNITY): Payer: Self-pay | Admitting: *Deleted

## 2014-09-07 DIAGNOSIS — R300 Dysuria: Secondary | ICD-10-CM | POA: Diagnosis not present

## 2014-09-07 DIAGNOSIS — J45909 Unspecified asthma, uncomplicated: Secondary | ICD-10-CM | POA: Insufficient documentation

## 2014-09-07 DIAGNOSIS — R52 Pain, unspecified: Secondary | ICD-10-CM

## 2014-09-07 DIAGNOSIS — Z9889 Other specified postprocedural states: Secondary | ICD-10-CM | POA: Insufficient documentation

## 2014-09-07 DIAGNOSIS — Z9851 Tubal ligation status: Secondary | ICD-10-CM | POA: Diagnosis not present

## 2014-09-07 DIAGNOSIS — K219 Gastro-esophageal reflux disease without esophagitis: Secondary | ICD-10-CM | POA: Diagnosis not present

## 2014-09-07 DIAGNOSIS — Z862 Personal history of diseases of the blood and blood-forming organs and certain disorders involving the immune mechanism: Secondary | ICD-10-CM | POA: Insufficient documentation

## 2014-09-07 DIAGNOSIS — R1032 Left lower quadrant pain: Secondary | ICD-10-CM | POA: Diagnosis not present

## 2014-09-07 DIAGNOSIS — R109 Unspecified abdominal pain: Secondary | ICD-10-CM | POA: Diagnosis not present

## 2014-09-07 DIAGNOSIS — R110 Nausea: Secondary | ICD-10-CM | POA: Diagnosis not present

## 2014-09-07 DIAGNOSIS — Z8742 Personal history of other diseases of the female genital tract: Secondary | ICD-10-CM | POA: Diagnosis not present

## 2014-09-07 DIAGNOSIS — E1340 Other specified diabetes mellitus with diabetic neuropathy, unspecified: Secondary | ICD-10-CM | POA: Diagnosis not present

## 2014-09-07 DIAGNOSIS — I10 Essential (primary) hypertension: Secondary | ICD-10-CM | POA: Diagnosis not present

## 2014-09-07 DIAGNOSIS — Z9089 Acquired absence of other organs: Secondary | ICD-10-CM | POA: Diagnosis not present

## 2014-09-07 DIAGNOSIS — R1031 Right lower quadrant pain: Secondary | ICD-10-CM | POA: Insufficient documentation

## 2014-09-07 DIAGNOSIS — Z7982 Long term (current) use of aspirin: Secondary | ICD-10-CM | POA: Diagnosis not present

## 2014-09-07 DIAGNOSIS — R1030 Lower abdominal pain, unspecified: Secondary | ICD-10-CM | POA: Diagnosis not present

## 2014-09-07 DIAGNOSIS — Z87891 Personal history of nicotine dependence: Secondary | ICD-10-CM | POA: Insufficient documentation

## 2014-09-07 DIAGNOSIS — Z86018 Personal history of other benign neoplasm: Secondary | ICD-10-CM | POA: Diagnosis not present

## 2014-09-07 DIAGNOSIS — Z79899 Other long term (current) drug therapy: Secondary | ICD-10-CM | POA: Diagnosis not present

## 2014-09-07 DIAGNOSIS — R140 Abdominal distension (gaseous): Secondary | ICD-10-CM | POA: Diagnosis not present

## 2014-09-07 HISTORY — DX: Irritable bowel syndrome, unspecified: K58.9

## 2014-09-07 LAB — URINALYSIS, ROUTINE W REFLEX MICROSCOPIC
Bilirubin Urine: NEGATIVE
Glucose, UA: NEGATIVE mg/dL
HGB URINE DIPSTICK: NEGATIVE
Ketones, ur: NEGATIVE mg/dL
Leukocytes, UA: NEGATIVE
Nitrite: NEGATIVE
PH: 5.5 (ref 5.0–8.0)
Protein, ur: NEGATIVE mg/dL
Urobilinogen, UA: 0.2 mg/dL (ref 0.0–1.0)

## 2014-09-07 LAB — CBC WITH DIFFERENTIAL/PLATELET
Basophils Absolute: 0 10*3/uL (ref 0.0–0.1)
Basophils Relative: 0 % (ref 0–1)
EOS ABS: 0.1 10*3/uL (ref 0.0–0.7)
Eosinophils Relative: 1 % (ref 0–5)
HEMATOCRIT: 32 % — AB (ref 36.0–46.0)
HEMOGLOBIN: 10.6 g/dL — AB (ref 12.0–15.0)
LYMPHS ABS: 3.7 10*3/uL (ref 0.7–4.0)
Lymphocytes Relative: 38 % (ref 12–46)
MCH: 28.7 pg (ref 26.0–34.0)
MCHC: 33.1 g/dL (ref 30.0–36.0)
MCV: 86.7 fL (ref 78.0–100.0)
Monocytes Absolute: 0.9 10*3/uL (ref 0.1–1.0)
Monocytes Relative: 9 % (ref 3–12)
NEUTROS ABS: 5.2 10*3/uL (ref 1.7–7.7)
Neutrophils Relative %: 52 % (ref 43–77)
Platelets: 258 10*3/uL (ref 150–400)
RBC: 3.69 MIL/uL — ABNORMAL LOW (ref 3.87–5.11)
RDW: 13.7 % (ref 11.5–15.5)
WBC: 9.9 10*3/uL (ref 4.0–10.5)

## 2014-09-07 LAB — COMPREHENSIVE METABOLIC PANEL
ALBUMIN: 4.3 g/dL (ref 3.5–5.0)
ALT: 22 U/L (ref 14–54)
ANION GAP: 10 (ref 5–15)
AST: 24 U/L (ref 15–41)
Alkaline Phosphatase: 39 U/L (ref 38–126)
BILIRUBIN TOTAL: 0.4 mg/dL (ref 0.3–1.2)
BUN: 18 mg/dL (ref 6–20)
CHLORIDE: 107 mmol/L (ref 101–111)
CO2: 27 mmol/L (ref 22–32)
CREATININE: 0.88 mg/dL (ref 0.44–1.00)
Calcium: 9.5 mg/dL (ref 8.9–10.3)
GFR calc non Af Amer: >60 mL/min (ref >60)
GLUCOSE: 87 mg/dL (ref 65–99)
POTASSIUM: 3.8 mmol/L (ref 3.5–5.1)
Sodium: 144 mmol/L (ref 135–145)
Total Protein: 8.3 g/dL — ABNORMAL HIGH (ref 6.5–8.1)

## 2014-09-07 LAB — LIPASE, BLOOD: LIPASE: 23 U/L (ref 22–51)

## 2014-09-07 MED ORDER — ONDANSETRON 4 MG PO TBDP: ORAL_TABLET | ORAL | Status: DC

## 2014-09-07 MED ORDER — HYDROMORPHONE HCL 1 MG/ML IJ SOLN
0.5000 mg | Freq: Once | INTRAMUSCULAR | Status: AC
  Administered 2014-09-07: 0.5 mg via INTRAVENOUS
  Filled 2014-09-07: qty 1

## 2014-09-07 MED ORDER — OXYCODONE-ACETAMINOPHEN 5-325 MG PO TABS: 1.0000 | ORAL_TABLET | Freq: Four times a day (QID) | ORAL | Status: DC | PRN

## 2014-09-07 MED ORDER — SODIUM CHLORIDE 0.9 % IJ SOLN
INTRAMUSCULAR | Status: AC
  Filled 2014-09-07: qty 30

## 2014-09-07 MED ORDER — ONDANSETRON HCL 4 MG/2ML IJ SOLN
4.0000 mg | Freq: Once | INTRAMUSCULAR | Status: AC
  Administered 2014-09-07: 4 mg via INTRAVENOUS
  Filled 2014-09-07: qty 2

## 2014-09-07 MED ORDER — SODIUM CHLORIDE 0.9 % IV BOLUS (SEPSIS)
1000.0000 mL | Freq: Once | INTRAVENOUS | Status: AC
  Administered 2014-09-07: 1000 mL via INTRAVENOUS

## 2014-09-07 NOTE — ED Provider Notes (Signed)
CSN: [id number]     Arrival date & time 09/07/14  2006 History  This chart was scribed for Erica Ferguson, MD by Randa Evens, ED Scribe. This patient was seen in room [facility] and the patient's care was started at 9:10 PM.    Chief Complaint  Patient presents with  . Abdominal Pain   Patient is a 49 y.o. female presenting with abdominal pain. The history is provided by the patient. No language interpreter was used.  Abdominal Pain Pain location:  Suprapubic Pain severity:  Moderate Duration:  1 day Progression:  Unchanged Relieved by: Phenergan. Associated symptoms: dysuria and nausea   Associated symptoms: no chest pain, no chills, no cough, no diarrhea, no fatigue, no flatus, no hematuria and no vomiting    HPI Comments: [name] is a 49 y.o. female who presents to the Emergency Department complaining of low abdominal pain onset 1 night prior. Pt states she has associated nausea and dysuria. Pt states she has been taking phenregan that has provided slight relief. Denies vomiting, fever or chills.  Pt report Hx of tubal ligation.   Past Medical History  Diagnosis Date  . Sarcoidosis   . Diabetes mellitus   . Hypertension   . Asthma   . Abnormal uterine bleeding (AUB) 11/26/2012  . BV (bacterial vaginosis) 11/26/2012  . Fatigue 12/24/2012  . Other and unspecified ovarian cyst 01/01/2013  . Fibroids 01/01/2013  . Hemorrhoid 01/01/2013  . Vaginal discharge 02/12/2013    +yeast  . Constipation   . GERD (gastroesophageal reflux disease)   . Neuropathy due to secondary diabetes   . 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 IBS (irritable bowel syndrome)    Past Surgical History  Procedure Laterality Date  . Tubal ligation    . Endometrial ablation    . Cholecystectomy    . Hemorrhoid surgery      banding  . Colonoscopy N/A 08/20/2013    Dr. Oneida Alar: normal mucosa in terminal ileum, mild diverticulosis in ascending colon, moderate sized hemorrhoids s/p banding  .  Hemorrhoid banding  08/20/2013    Procedure: HEMORRHOID BANDING;  Surgeon: Danie Binder, MD;  Location: [facility];  Service: Endoscopy;;  . Hemorrhoid surgery N/A 12/25/2013    Procedure: EXTENSIVE HEMORRHOIDECTOMY;  Surgeon: Jamesetta So, MD;  Location: [facility];  Service: General;  Laterality: N/A;   Family History  Problem Relation Age of Onset  . Diabetes Father   . Cancer Mother     throat  . Cancer Brother     lung  . Colon cancer Neg Hx   . Breast cancer Cousin    History  Substance Use Topics  . Smoking status: Former Smoker -- 1.00 packs/day for 6 years    Types: Cigarettes    Quit date: 12/22/2003  . Smokeless tobacco: Never Used     Comment: Quit  5 years  . Alcohol Use: Yes     Comment: beer once a month   OB History    Gravida Para Term Preterm AB TAB SAB Ectopic Multiple Living   6 2   4  2   2      Review of Systems  Constitutional: Negative for chills, appetite change and fatigue.  HENT: Negative for congestion, ear discharge and sinus pressure.   Eyes: Negative for discharge.  Respiratory: Negative for cough.   Cardiovascular: Negative for chest pain.  Gastrointestinal: Positive for nausea and abdominal pain. Negative for vomiting, diarrhea and flatus.  Genitourinary: Positive for  dysuria. Negative for frequency and hematuria.  Musculoskeletal: Negative for back pain.  Skin: Negative for rash.  Neurological: Negative for seizures and headaches.  Psychiatric/Behavioral: Negative for hallucinations.      Allergies  Shellfish allergy  Home Medications   Prior to Admission medications   Medication Sig Start Date End Date Taking? Authorizing Provider  aspirin EC 325 MG tablet Take 325 mg by mouth daily.   Yes Historical Provider, MD  atorvastatin (LIPITOR) 20 MG tablet Take 20 mg by mouth at bedtime.    Yes Historical Provider, MD  benazepril (LOTENSIN) 10 MG tablet Take 10 mg by mouth daily.    Yes Historical Provider, MD  fenofibrate (TRICOR)  145 MG tablet Take 145 mg by mouth at bedtime.    Yes Historical Provider, MD  ibuprofen (ADVIL,MOTRIN) 800 MG tablet TAKE ONE TABLET BY MOUTH EVERY 8 HOURS AS NEEDED 01/11/14  Yes Estill Dooms, NP  metFORMIN (GLUCOPHAGE) 1000 MG tablet Take 1,000 mg by mouth 2 (two) times daily with a meal.    Yes Historical Provider, MD  promethazine (PHENERGAN) 25 MG tablet Take 1 tablet (25 mg total) by mouth every 6 (six) hours as needed for nausea. 01/03/13  Yes Nat Christen, MD  ranitidine (ZANTAC) 150 MG tablet Take 300 mg by mouth 2 (two) times daily.    Yes Historical Provider, MD  docusate sodium (COLACE) 100 MG capsule Take 1 capsule (100 mg total) by mouth every 12 (twelve) hours. Patient not taking: Reported on 08/18/2014 07/16/14   [name], MD  fluconazole (DIFLUCAN) 150 MG tablet Take 1 now and 1 in 3 days Patient not taking: Reported on 08/18/2014 04/29/14   Estill Dooms, NP  HYDROcodone-acetaminophen (NORCO/VICODIN) 5-325 MG per tablet Take 1 tablet by mouth every 6 (six) hours as needed. Patient not taking: Reported on 08/18/2014 07/16/14   [name], MD   BP 114/55 mmHg  Pulse 76  Temp(Src) 98.2 F (36.8 C) (Oral)  Resp 18  Ht 5\' 6"  (1.676 m)  Wt 179 lb (81.194 kg)  BMI 28.91 kg/m2  SpO2 100%   Physical Exam  Constitutional: She is oriented to person, place, and time. She appears well-developed.  HENT:  Head: Normocephalic.  Eyes: Conjunctivae and EOM are normal. No scleral icterus.  Neck: Neck supple. No thyromegaly present.  Cardiovascular: Normal rate and regular rhythm.  Exam reveals no gallop and no friction rub.   No murmur heard. Pulmonary/Chest: No stridor. She has no wheezes. She has no rales. She exhibits no tenderness.  Abdominal: She exhibits no distension. There is tenderness in the right lower quadrant and left lower quadrant. There is no rebound.  Musculoskeletal: Normal range of motion. She exhibits no edema.  Lymphadenopathy:    She has no cervical  adenopathy.  Neurological: She is oriented to person, place, and time. She exhibits normal muscle tone. Coordination normal.  Skin: No rash noted. No erythema.  Psychiatric: She has a normal mood and affect. Her behavior is normal.    ED Course  Procedures (including critical care time) DIAGNOSTIC STUDIES: Oxygen Saturation is 100% on RA, normal by my interpretation.    COORDINATION OF CARE: 9:24 PM-Discussed treatment plan with pt at bedside and pt agreed to plan.     Labs Review Labs Reviewed  URINALYSIS, ROUTINE W REFLEX MICROSCOPIC (NOT AT [facility]) - Abnormal; Notable for the following:    Specific Gravity, Urine >1.030 (*)    All other components within normal limits  CBC WITH DIFFERENTIAL/PLATELET -  Abnormal; Notable for the following:    RBC 3.69 (*)    Hemoglobin 10.6 (*)    HCT 32.0 (*)    All other components within normal limits  COMPREHENSIVE METABOLIC PANEL  LIPASE, BLOOD    Imaging Review No results found.   EKG Interpretation None      MDM   Final diagnoses:  None     The chart was scribed for me under my direct supervision.  I personally performed the history, physical, and medical decision making and all procedures in the evaluation of this patient.Erica Ferguson, MD 09/14/14 [phone]

## 2014-09-07 NOTE — ED Notes (Signed)
Pt c/o pain in her "ovaries" and is nauseated and has dysuria. Pt pointing to lower abdominal area. Symptoms since last night.

## 2014-09-07 NOTE — Discharge Instructions (Signed)
Follow up with your md this week.  Take liquids only

## 2014-09-08 DIAGNOSIS — R140 Abdominal distension (gaseous): Secondary | ICD-10-CM | POA: Diagnosis not present

## 2014-09-08 DIAGNOSIS — R109 Unspecified abdominal pain: Secondary | ICD-10-CM | POA: Diagnosis not present

## 2014-09-08 MED ORDER — OXYCODONE-ACETAMINOPHEN 5-325 MG PO TABS
ORAL_TABLET | ORAL | Status: AC
  Filled 2014-09-08: qty 1

## 2014-09-08 MED ORDER — IOHEXOL 300 MG/ML  SOLN
100.0000 mL | Freq: Once | INTRAMUSCULAR | Status: AC | PRN
  Administered 2014-09-08: 100 mL via INTRAVENOUS

## 2014-09-08 MED ORDER — OXYCODONE-ACETAMINOPHEN 5-325 MG PO TABS
1.0000 | ORAL_TABLET | Freq: Once | ORAL | Status: AC
Start: 2014-09-08 — End: 2014-09-08
  Administered 2014-09-08: 1 via ORAL

## 2014-09-08 NOTE — ED Provider Notes (Signed)
Patient was left at change of shift to get results of her CT scan. She appears to be in no distress. She was given Dr Ellsworth Lennox discharge instructions.     Ct Abdomen Pelvis W Contrast  09/08/2014   CLINICAL DATA:  Abdominal pain and bloating since yesterday.  EXAM: CT ABDOMEN AND PELVIS WITH CONTRAST  TECHNIQUE: Multidetector CT imaging of the abdomen and pelvis was performed using the standard protocol following bolus administration of intravenous contrast.  CONTRAST:  176mL OMNIPAQUE IOHEXOL 300 MG/ML  SOLN  COMPARISON:  None.  FINDINGS: Lower chest:  Mild linear scarring.  No acute findings.  Hepatobiliary: There is cholecystectomy. The liver and bile ducts appear normal.  Pancreas: Normal  Spleen: Normal  Adrenals/Urinary Tract: The adrenals and kidneys are normal in appearance. There is no urinary calculus evident. There is no hydronephrosis or ureteral dilatation. Collecting systems and ureters appear unremarkable.  Stomach/Bowel: There are normal appearances of the stomach, small bowel and colon. The appendix is normal.  Vascular/Lymphatic: The abdominal aorta is normal in caliber. There is mild atherosclerotic calcification. There is no adenopathy in the abdomen or pelvis.  Reproductive: The uterus and ovaries appear unremarkable.  Other: No acute inflammatory changes are evident in the abdomen or pelvis. There is no ascites.  Musculoskeletal: No significant abnormality.  IMPRESSION: No significant abnormality.   Electronically Signed   By: Andreas Newport M.D.   On: 09/08/2014 02:10   Dg Abd Acute W/chest  09/07/2014   CLINICAL DATA:  Lower abdominal pain. Nausea. Dysuria. Symptoms began last night.  EXAM: DG ABDOMEN ACUTE W/ 1V CHEST  COMPARISON:  CT 01/03/2013  FINDINGS: There is mild dilatation of mid abdominal small bowel. There is no extraluminal air. There is a generous volume of stool and air throughout the colon to the rectum. There is no colonic distention.  Upright view of the chest  demonstrates unchanged linear scarring bilaterally but no acute cardiopulmonary findings.  IMPRESSION: Abnormal gas pattern with mild small bowel dilatation. Partial or early obstruction cannot be excluded.   Electronically Signed   By: Andreas Newport M.D.   On: 09/07/2014 23:00    Rolland Porter, MD, Barbette Or, MD 09/08/14 [phone]

## 2014-09-15 ENCOUNTER — Encounter: Payer: Self-pay | Admitting: Gastroenterology

## 2014-09-28 DIAGNOSIS — E119 Type 2 diabetes mellitus without complications: Secondary | ICD-10-CM | POA: Diagnosis not present

## 2014-09-28 DIAGNOSIS — M542 Cervicalgia: Secondary | ICD-10-CM | POA: Diagnosis not present

## 2014-09-28 DIAGNOSIS — I10 Essential (primary) hypertension: Secondary | ICD-10-CM | POA: Diagnosis not present

## 2014-09-29 DIAGNOSIS — E119 Type 2 diabetes mellitus without complications: Secondary | ICD-10-CM | POA: Diagnosis not present

## 2014-09-29 DIAGNOSIS — I10 Essential (primary) hypertension: Secondary | ICD-10-CM | POA: Diagnosis not present

## 2014-10-11 DIAGNOSIS — H04123 Dry eye syndrome of bilateral lacrimal glands: Secondary | ICD-10-CM | POA: Diagnosis not present

## 2014-10-11 DIAGNOSIS — Z7982 Long term (current) use of aspirin: Secondary | ICD-10-CM | POA: Diagnosis not present

## 2014-10-11 DIAGNOSIS — Z7951 Long term (current) use of inhaled steroids: Secondary | ICD-10-CM | POA: Diagnosis not present

## 2014-10-11 DIAGNOSIS — H02206 Unspecified lagophthalmos left eye, unspecified eyelid: Secondary | ICD-10-CM | POA: Diagnosis not present

## 2014-10-11 DIAGNOSIS — D869 Sarcoidosis, unspecified: Secondary | ICD-10-CM | POA: Diagnosis not present

## 2014-10-11 DIAGNOSIS — H02203 Unspecified lagophthalmos right eye, unspecified eyelid: Secondary | ICD-10-CM | POA: Diagnosis not present

## 2014-10-11 DIAGNOSIS — Z91013 Allergy to seafood: Secondary | ICD-10-CM | POA: Diagnosis not present

## 2014-10-11 DIAGNOSIS — H11153 Pinguecula, bilateral: Secondary | ICD-10-CM | POA: Diagnosis not present

## 2014-10-11 DIAGNOSIS — Z79899 Other long term (current) drug therapy: Secondary | ICD-10-CM | POA: Diagnosis not present

## 2014-10-11 DIAGNOSIS — Z87891 Personal history of nicotine dependence: Secondary | ICD-10-CM | POA: Diagnosis not present

## 2014-10-20 ENCOUNTER — Telehealth: Payer: Self-pay | Admitting: Gastroenterology

## 2014-10-20 NOTE — Telephone Encounter (Signed)
Patient called to say that when she went to the bathroom over the weekend that she was passing blood. She said that she doesn't go to have a BM everyday and it gets packed. She is asking to speak with the nurse. Please call [phone]

## 2014-10-21 NOTE — Telephone Encounter (Signed)
I called pt. She said she took Linzess last Friday and then she had a blow out and saw some dark blood on Sat and Sun. She has not seen any more. She has not had any more BM's. She said she never has a BM everyday.  Please advise!

## 2014-10-22 MED ORDER — HYDROCORTISONE 2.5 % RE CREA: 1.0000 "application " | TOPICAL_CREAM | Freq: Four times a day (QID) | RECTAL | Status: DC

## 2014-10-22 NOTE — Telephone Encounter (Addendum)
PLEASE CALL PT. SHE SHOULD AVOID CONSTIPATION. TAKE LINZESS REGULARLY. DRINK WATER TO KEEP YOUR URINE LIGHT YELLOW. FOLLOW A HIGH FIBER DIET. AVOID ITEMS THAT CAUSE BLOATING & GAS. ADD ANUSOL CREAM QID FOR 14 DAYS. OPV AUG 11.

## 2014-10-22 NOTE — Addendum Note (Signed)
Addended by: Danie Binder on: 10/22/2014 10:38 AM   Modules accepted: Orders

## 2014-10-25 NOTE — Telephone Encounter (Signed)
Pt is aware.  

## 2014-10-31 NOTE — Progress Notes (Signed)
REVIEWED-NO ADDITIONAL RECOMMENDATIONS. 

## 2014-11-05 ENCOUNTER — Other Ambulatory Visit: Payer: Self-pay | Admitting: Adult Health

## 2014-11-05 DIAGNOSIS — J441 Chronic obstructive pulmonary disease with (acute) exacerbation: Secondary | ICD-10-CM | POA: Diagnosis not present

## 2014-11-05 DIAGNOSIS — D869 Sarcoidosis, unspecified: Secondary | ICD-10-CM | POA: Diagnosis not present

## 2014-11-05 DIAGNOSIS — Z1231 Encounter for screening mammogram for malignant neoplasm of breast: Secondary | ICD-10-CM

## 2014-11-11 ENCOUNTER — Encounter: Payer: Self-pay | Admitting: Gastroenterology

## 2014-11-11 ENCOUNTER — Ambulatory Visit (INDEPENDENT_AMBULATORY_CARE_PROVIDER_SITE_OTHER): Payer: Medicare Other | Admitting: Gastroenterology

## 2014-11-11 VITALS — BP 114/70 | HR 90 | Temp 97.0°F | Ht 66.0 in | Wt 183.4 lb

## 2014-11-11 DIAGNOSIS — K648 Other hemorrhoids: Secondary | ICD-10-CM

## 2014-11-11 DIAGNOSIS — K5901 Slow transit constipation: Secondary | ICD-10-CM | POA: Diagnosis not present

## 2014-11-11 DIAGNOSIS — K219 Gastro-esophageal reflux disease without esophagitis: Secondary | ICD-10-CM

## 2014-11-11 NOTE — Assessment & Plan Note (Signed)
SYMPTOMS NOT IDEALLY CONTROLLED DUE TO PROBLEM WITH CONSTIPATION.  CONTINUE TO MONITOR SYMPTOMS. ANUSOL OR PREPARATION H PRN RECTAL PAIN/BLEEDING. FOLLOW UP IN 4 MOS.

## 2014-11-11 NOTE — Progress Notes (Signed)
Subjective:    Patient ID: [name], female    DOB: [birth year], 49 y.o.   MRN: [id number]  [name], MD  HPI Bowels not regulated. Linzess 145 work too much. IT MAY CAUSE EXPLOSIVE DIARRHEA. LAST TIME SHE TOOK LINZESS SHE HAD RECTAL BLEEDING THE DAY AFTER. DRINKING WATER. LAYING OFF SODA. TRYING TO GET FIBER IN HER DIET: POPCORN & NUTS. BMS: 1X/WEEK(#1)  PT DENIES FEVER, CHILLS, nausea, vomiting, melena, CHEST PAIN, SHORTNESS OF BREATH,  CHANGE IN BOWEL IN HABITS, abdominal pain, problems swallowing, OR heartburn or indigestion.  Past Medical History  Diagnosis Date  . Sarcoidosis   . Diabetes mellitus   . Hypertension   . Asthma   . Abnormal uterine bleeding (AUB) 11/26/2012  . BV (bacterial vaginosis) 11/26/2012  . Fatigue 12/24/2012  . Other and unspecified ovarian cyst 01/01/2013  . Fibroids 01/01/2013  . Hemorrhoid 01/01/2013  . Vaginal discharge 02/12/2013    +yeast  . Constipation   . GERD (gastroesophageal reflux disease)   . Neuropathy due to secondary diabetes   . 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 IBS (irritable bowel syndrome)     Past Surgical History  Procedure Laterality Date  . Tubal ligation    . Endometrial ablation    . Cholecystectomy    . Hemorrhoid surgery      banding  . Colonoscopy N/A 08/20/2013    Dr. Oneida Alar: normal mucosa in terminal ileum, mild diverticulosis in ascending colon, moderate sized hemorrhoids s/p banding  . Hemorrhoid banding  08/20/2013    Procedure: HEMORRHOID BANDING;  Surgeon: Danie Binder, MD;  Location: [facility];  Service: Endoscopy;;  . Hemorrhoid surgery N/A 12/25/2013    Procedure: EXTENSIVE HEMORRHOIDECTOMY;  Surgeon: Jamesetta So, MD;  Location: [facility];  Service: General;  Laterality: N/A;   Allergies  Allergen Reactions  . Shellfish Allergy Anaphylaxis    Current Outpatient Prescriptions  Medication Sig Dispense Refill  . aspirin EC 325 MG tablet Take 325 mg by mouth daily.    Marland Kitchen  atorvastatin (LIPITOR) 20 MG tablet Take 20 mg by mouth at bedtime.     . benazepril (LOTENSIN) 10 MG tablet Take 10 mg by mouth daily.     . fenofibrate (TRICOR) 145 MG tablet Take 145 mg by mouth at bedtime.     . hydrocortisone (ANUSOL-HC) 2.5 % rectal cream Place 1 application rectally 4 (four) times daily. FOR 14 DAYS LAST WEEK FOR PAIN   . ibuprofen (ADVIL,MOTRIN) 800 MG tablet TAKE ONE TABLET BY MOUTH EVERY 8 HOURS AS NEEDED    . metFORMIN (GLUCOPHAGE) 1000 MG tablet Take 1,000 mg by mouth 2 (two) times daily with a meal.     . Omega-3 Fatty Acids (FISH OIL) 1000 MG CAPS Take 1,000 mg by mouth 3 (three) times daily before meals.    . ondansetron (ZOFRAN ODT) 4 MG disintegrating tablet 4mg  ODT q4 hours prn nausea/vomit RARE   . promethazine (PHENERGAN) 25 MG tablet Take 1 tablet (25 mg total) by mouth every 6 (six) hours as needed for nausea. RARE   . ranitidine (ZANTAC) 150 MG tablet Take 300 mg by mouth 2 (two) times daily.     .      .      .      .       Review of Systems PER HPI OTHERWISE ALL SYSTEMS ARE NEGATIVE.     Objective:   Physical Exam  Constitutional: She is oriented to  person, place, and time. She appears well-developed and well-nourished. No distress.  HENT:  Head: Normocephalic and atraumatic.  Mouth/Throat: Oropharynx is clear and moist. No oropharyngeal exudate.  Eyes: Pupils are equal, round, and reactive to light. No scleral icterus.  Neck: Normal range of motion. Neck supple.  Cardiovascular: Normal rate, regular rhythm and normal heart sounds.   Pulmonary/Chest: Effort normal and breath sounds normal. No respiratory distress.  Abdominal: Soft. Bowel sounds are normal. She exhibits no distension. There is no tenderness.  Musculoskeletal: She exhibits no edema.  Neurological: She is alert and oriented to person, place, and time.  NO FOCAL DEFICITS   Psychiatric: She has a normal mood and affect.  Vitals reviewed.         Assessment & Plan:

## 2014-11-11 NOTE — Assessment & Plan Note (Signed)
SYMPTOMS CONTROLLED/RESOLVED.  CONTINUE TO MONITOR SYMPTOMS. ZANTAC BID AVOID REFLUX TRIGGERS FOLLOW UP IN 4 MOS.

## 2014-11-11 NOTE — Progress Notes (Signed)
cc'ed to pcp °

## 2014-11-11 NOTE — Patient Instructions (Addendum)
DRINK WATER TO KEEP YOUR URINE LIGHT YELLOW.  EAT FIBER.  SEE INFO BELOW.  AVOID THINGS THAT TRIGGER REFLUX. SEE INFO BELOW.  BREAK LINZESS CAPSULES AND PUT IN 4 TBSP WATER. TAKE 2 TBSP DAILY. AFTER 3 WEEKS IF YOU ARE NOT HAVING A BOWEL MOVEMENT 2 OR 3 TIMES A WEEK THAN TAKE 3 TBSP DAILY.  CONTINUE ZANTAC.  ANUSOL OR PREPARATION H AS NEEDED FOR RECTAL PAIN/BLEEDING.  FOLLOW UP IN 4 MOS.   Lifestyle and home remedies TO MANAGE REFLUX  You may eliminate or reduce the frequency of heartburn by making the following lifestyle changes:  . Control your weight. Being overweight is a major risk factor for heartburn and GERD. Excess pounds put pressure on your abdomen, pushing up your stomach and causing acid to back up into your esophagus.   . Eat smaller meals. 4 TO 6 MEALS A DAY. This reduces pressure on the lower esophageal sphincter, helping to prevent the valve from opening and acid from washing back into your esophagus.   Dolphus Jenny your belt. Clothes that fit tightly around your waist put pressure on your abdomen and the lower esophageal sphincter.   . Eliminate heartburn triggers. Everyone has specific triggers. Common triggers such as fatty or fried foods, spicy food, tomato sauce, carbonated beverages, alcohol, chocolate, mint, garlic, onion, caffeine and nicotine may make heartburn w orse.   Marland Kitchen Avoid stooping or bending. Tying your shoes is OK. Bending over for longer periods to weed your garden isn't, especially soon after eating.   . Don't lie down after a meal. Wait at least three to four hours after eating before going to bed, and don't lie down right after eating.   Marland Kitchen PUT THE HEAD OF YOUR BED ON 6 INCH BLOCKS.   Alternative medicine . Several home remedies exist for treating GERD, but they provide only temporary relief. They include drinking baking soda (sodium bicarbonate) added to water or drinking other fluids such as baking soda mixed with cream of tartar and  water.  . Although these liquids create temporary relief by neutralizing, washing away or buffering acids, eventually they aggravate the situation by adding gas and fluid to your stomach, increasing pressure and causing more acid reflux. Further, adding more sodium to your diet may increase your blood pressure and add stress to your heart, and excessive bicarbonate ingestion can alter the acid-base balance in your body.  High-Fiber Diet A high-fiber diet changes your normal diet to include more whole grains, legumes, fruits, and vegetables. Changes in the diet involve replacing refined carbohydrates with unrefined foods. The calorie level of the diet is essentially unchanged. The Dietary Reference Intake (recommended amount) for adult males is 38 grams per day. For adult females, it is 25 grams per day. Pregnant and lactating women should consume 28 grams of fiber per day. Fiber is the intact part of a plant that is not broken down during digestion. Functional fiber is fiber that has been isolated from the plant to provide a beneficial effect in the body. PURPOSE  Increase stool bulk.   Ease and regulate bowel movements.   Lower cholesterol.  REDUCE RISK OF COLON CANCER  INDICATIONS THAT YOU NEED MORE FIBER  Constipation and hemorrhoids.   Uncomplicated diverticulosis (intestine condition) and irritable bowel syndrome.   Weight management.   As a protective measure against hardening of the arteries (atherosclerosis), diabetes, and cancer.   GUIDELINES FOR INCREASING FIBER IN THE DIET  Start adding fiber to the diet slowly. A  gradual increase of about 5 more grams (2 slices of whole-wheat bread, 2 servings of most fruits or vegetables, or 1 bowl of high-fiber cereal) per day is best. Too rapid an increase in fiber may result in constipation, flatulence, and bloating.   Drink enough water and fluids to keep your urine clear or pale yellow. Water, juice, or caffeine-free drinks are  recommended. Not drinking enough fluid may cause constipation.   Eat a variety of high-fiber foods rather than one type of fiber.   Try to increase your intake of fiber through using high-fiber foods rather than fiber pills or supplements that contain small amounts of fiber.   The goal is to change the types of food eaten. Do not supplement your present diet with high-fiber foods, but replace foods in your present diet.  INCLUDE A VARIETY OF FIBER SOURCES  Replace refined and processed grains with whole grains, canned fruits with fresh fruits, and incorporate other fiber sources. White rice, white breads, and most bakery goods contain little or no fiber.   Brown whole-grain rice, buckwheat oats, and many fruits and vegetables are all good sources of fiber. These include: broccoli, Brussels sprouts, cabbage, cauliflower, beets, sweet potatoes, white potatoes (skin on), carrots, tomatoes, eggplant, squash, berries, fresh fruits, and dried fruits.   Cereals appear to be the richest source of fiber. Cereal fiber is found in whole grains and bran. Bran is the fiber-rich outer coat of cereal grain, which is largely removed in refining. In whole-grain cereals, the bran remains. In breakfast cereals, the largest amount of fiber is found in those with "bran" in their names. The fiber content is sometimes indicated on the label.   You may need to include additional fruits and vegetables each day.   In baking, for 1 cup white flour, you may use the following substitutions:   1 cup whole-wheat flour minus 2 tablespoons.   1/2 cup white flour plus 1/2 cup whole-wheat flour.

## 2014-11-11 NOTE — Assessment & Plan Note (Signed)
SYMPTOMS NOT CONTROLLED ONLY 1 BM/WEEK. LINZESS 145 CAUSES EXPLOSIVE WATERY DIARRHEA.  DRINK WATER TO KEEP YOUR URINE LIGHT YELLOW. EAT FIBER.  HANDOUT GIVEN. DISCUSSED BENEFITS, AND MANAGEMENT OF CONSTIPATION AND MEDICATION TITRATION. BREAK LINZESS CAPSULES AND PUT IN 4 TBSP WATER. TAKE 2 TBSP DAILY. IF NOT HAVING A BOWEL MOVEMENT 2 OR 3 TIMES A WEEK THAN TAKE 3 TBSP DAILY. FOLLOW UP IN 4 MOS.   GREATER THAN 50% WAS SPENT IN COUNSELING & COORDINATION OF CARE WITH THE PATIENT: DISCUSSED BENEFITS, AND MANAGEMENT OF CONSTIPATION. TOTAL ENCOUNTER TIME: 15 MINS.

## 2014-11-11 NOTE — Progress Notes (Signed)
ON RECALL LIST  °

## 2014-11-12 DIAGNOSIS — H5213 Myopia, bilateral: Secondary | ICD-10-CM | POA: Diagnosis not present

## 2014-11-12 DIAGNOSIS — H524 Presbyopia: Secondary | ICD-10-CM | POA: Diagnosis not present

## 2014-11-12 DIAGNOSIS — E119 Type 2 diabetes mellitus without complications: Secondary | ICD-10-CM | POA: Diagnosis not present

## 2014-11-28 ENCOUNTER — Other Ambulatory Visit: Payer: Self-pay | Admitting: Gastroenterology

## 2014-11-30 NOTE — Telephone Encounter (Signed)
PT said she does need a refill and she is taking the Linzess 290 mcg.

## 2014-11-30 NOTE — Telephone Encounter (Signed)
Please check to see if patient needs refill on Linzess. If so, what dose? Per [facility] recent OV note, she was using 162mcg.

## 2014-12-02 ENCOUNTER — Other Ambulatory Visit: Payer: Self-pay | Admitting: Gastroenterology

## 2014-12-02 MED ORDER — LINACLOTIDE 290 MCG PO CAPS: 290.0000 ug | ORAL_CAPSULE | Freq: Every day | ORAL | Status: DC

## 2014-12-13 ENCOUNTER — Ambulatory Visit (HOSPITAL_COMMUNITY): Payer: Medicare Other

## 2014-12-15 ENCOUNTER — Ambulatory Visit (HOSPITAL_COMMUNITY)
Admission: RE | Admit: 2014-12-15 | Discharge: 2014-12-15 | Disposition: A | Discharge status: Home / Self Care | Payer: Medicare Other | Source: Ambulatory Visit | Attending: Adult Health | Admitting: Adult Health

## 2014-12-15 DIAGNOSIS — Z1231 Encounter for screening mammogram for malignant neoplasm of breast: Secondary | ICD-10-CM

## 2014-12-28 DIAGNOSIS — E119 Type 2 diabetes mellitus without complications: Secondary | ICD-10-CM | POA: Diagnosis not present

## 2014-12-28 DIAGNOSIS — Z0000 Encounter for general adult medical examination without abnormal findings: Secondary | ICD-10-CM | POA: Diagnosis not present

## 2014-12-28 DIAGNOSIS — I10 Essential (primary) hypertension: Secondary | ICD-10-CM | POA: Diagnosis not present

## 2014-12-28 DIAGNOSIS — J4521 Mild intermittent asthma with (acute) exacerbation: Secondary | ICD-10-CM | POA: Diagnosis not present

## 2014-12-28 DIAGNOSIS — K644 Residual hemorrhoidal skin tags: Secondary | ICD-10-CM | POA: Diagnosis not present

## 2014-12-28 DIAGNOSIS — Z1389 Encounter for screening for other disorder: Secondary | ICD-10-CM | POA: Diagnosis not present

## 2014-12-30 ENCOUNTER — Ambulatory Visit (INDEPENDENT_AMBULATORY_CARE_PROVIDER_SITE_OTHER): Payer: Medicare Other | Admitting: Gastroenterology

## 2014-12-30 ENCOUNTER — Encounter: Payer: Self-pay | Admitting: Gastroenterology

## 2014-12-30 VITALS — BP 116/74 | HR 99 | Temp 97.1°F | Ht 66.0 in | Wt 187.0 lb

## 2014-12-30 DIAGNOSIS — E119 Type 2 diabetes mellitus without complications: Secondary | ICD-10-CM | POA: Diagnosis not present

## 2014-12-30 DIAGNOSIS — Z79899 Other long term (current) drug therapy: Secondary | ICD-10-CM | POA: Diagnosis not present

## 2014-12-30 DIAGNOSIS — K5900 Constipation, unspecified: Secondary | ICD-10-CM | POA: Diagnosis not present

## 2014-12-30 DIAGNOSIS — K648 Other hemorrhoids: Secondary | ICD-10-CM

## 2014-12-30 DIAGNOSIS — K5901 Slow transit constipation: Secondary | ICD-10-CM | POA: Diagnosis not present

## 2014-12-30 DIAGNOSIS — Z87442 Personal history of urinary calculi: Secondary | ICD-10-CM | POA: Diagnosis not present

## 2014-12-30 DIAGNOSIS — R109 Unspecified abdominal pain: Secondary | ICD-10-CM | POA: Diagnosis not present

## 2014-12-30 DIAGNOSIS — Z7982 Long term (current) use of aspirin: Secondary | ICD-10-CM | POA: Diagnosis not present

## 2014-12-30 DIAGNOSIS — I10 Essential (primary) hypertension: Secondary | ICD-10-CM | POA: Diagnosis not present

## 2014-12-30 DIAGNOSIS — Z87891 Personal history of nicotine dependence: Secondary | ICD-10-CM | POA: Diagnosis not present

## 2014-12-30 MED ORDER — HYDROCORTISONE 2.5 % RE CREA: 1.0000 "application " | TOPICAL_CREAM | Freq: Four times a day (QID) | RECTAL | Status: DC

## 2014-12-30 NOTE — Progress Notes (Signed)
Subjective:    Patient ID: [name], female    DOB: [birth year], 49 y.o.   MRN: [id number] Erica Burly, MD  HPI BLEEDING NOT CONTROLLED. CONSTIPATION NOT CONTROLLED. FEELS LIKE INCOMPLETE EVACUATION. NO STRAINING OR HEAVY LIFTING. BLEEDING: WHEN SHE WIPES AND ON PAPER. MAY POUR OUT FOR A LITTLE BIT. SAW DR. HASANAJ AND DIDN'T SEE ANYTHING. TAKIN ZANTAC 300 MG BID EVERY DAY. HEARTBURN: FAIRLY WELL CONTROLLED. BMs: EVERY 2-3 DAYS. TAKING LINZESS EVERY DAY BEFORE MEALS. STOMACH STAYS HURTING ALL THE TIME(LOWER)  PT DENIES FEVER, CHILLS, HEMATEMESIS, nausea, vomiting, melena, diarrhea, CHEST PAIN, SHORTNESS OF BREATH, CHANGE IN BOWEL IN HABITS,  problems swallowing, OR  heartburn or indigestion.  Past Medical History  Diagnosis Date  . Sarcoidosis   . Diabetes mellitus   . Hypertension   . Asthma   . Abnormal uterine bleeding (AUB) 11/26/2012  . BV (bacterial vaginosis) 11/26/2012  . Fatigue 12/24/2012  . Other and unspecified ovarian cyst 01/01/2013  . Fibroids 01/01/2013  . Hemorrhoid 01/01/2013  . Vaginal discharge 02/12/2013    +yeast  . Constipation   . GERD (gastroesophageal reflux disease)   . Neuropathy due to secondary diabetes   . 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 IBS (irritable bowel syndrome)    Past Surgical History  Procedure Laterality Date  . Tubal ligation    . Endometrial ablation    . Cholecystectomy    . Hemorrhoid surgery      banding  . Colonoscopy N/A 08/20/2013    Dr. Oneida Alar: normal mucosa in terminal ileum, mild diverticulosis in ascending colon, moderate sized hemorrhoids s/p banding  . Hemorrhoid banding  08/20/2013    Procedure: HEMORRHOID BANDING;  Surgeon: Danie Binder, MD;  Location: [facility];  Service: Endoscopy;;  . Hemorrhoid surgery N/A 12/25/2013    Procedure: EXTENSIVE HEMORRHOIDECTOMY;  Surgeon: Jamesetta So, MD;  Location: [facility];  Service: General;  Laterality: N/A;   Allergies  Allergen Reactions  . Shellfish  Allergy Anaphylaxis   Current Outpatient Prescriptions  Medication Sig Dispense Refill  . aspirin EC 325 MG tablet Take 325 mg by mouth daily.    Marland Kitchen atorvastatin (LIPITOR) 20 MG tablet Take 20 mg by mouth at bedtime.     . benazepril (LOTENSIN) 10 MG tablet Take 10 mg by mouth daily.     Marland Kitchen docusate sodium (COLACE) 100 MG capsule Take 1 capsule (100 mg total) by mouth every 12 (twelve) hours.    . fenofibrate (TRICOR) 145 MG tablet Take 145 mg by mouth at bedtime.     . fluconazole (DIFLUCAN) 150 MG tablet Take 1 now and 1 in 3 days    . hydrocortisone (ANUSOL-HC) 2.5 % rectal cream Place 1 application rectally 4 (four) times daily. FOR 14 DAYS    . ibuprofen (ADVIL,MOTRIN) 800 MG tablet TAKE ONE TABLET BY MOUTH EVERY 8 HOURS AS NEEDED    . Linaclotide (LINZESS) 290 MCG CAPS capsule Take 1 capsule (290 mcg total) by mouth daily.    . metFORMIN (GLUCOPHAGE) 1000 MG tablet Take 1,000 mg by mouth 2 (two) times daily with a meal.     . ondansetron (ZOFRAN ODT) 4 MG disintegrating tablet 4mg  ODT q4 hours prn nausea/vomit    . promethazine (PHENERGAN) 25 MG tablet Take 1 tablet (25 mg total) by mouth every 6 (six) hours as needed for nausea.    . ranitidine (ZANTAC) 150 MG tablet Take 300 mg by mouth 2 (two) times daily.     Marland Kitchen      Marland Kitchen  Omega-3 Fatty Acids (FISH OIL) 1000 MG CAPS Take 1,000 mg by mouth 3 (three) times daily before meals.    .         Review of Systems PER HPI OTHERWISE ALL SYSTEMS ARE NEGATIVE.    Objective:   Physical Exam  Constitutional: She is oriented to person, place, and time. She appears well-developed and well-nourished. No distress.  HENT:  Head: Normocephalic and atraumatic.  Mouth/Throat: Oropharynx is clear and moist. No oropharyngeal exudate.  Eyes: Pupils are equal, round, and reactive to light. No scleral icterus.  Neck: Normal range of motion. Neck supple.  Cardiovascular: Normal rate, regular rhythm and normal heart sounds.   Pulmonary/Chest: Effort normal  and breath sounds normal. No respiratory distress.  Abdominal: Soft. Bowel sounds are normal. She exhibits no distension. There is no tenderness.  Musculoskeletal: She exhibits no edema.  Lymphadenopathy:    She has no cervical adenopathy.  Neurological: She is alert and oriented to person, place, and time.  NO FOCAL DEFICITS  Psychiatric:  FLAT AFFECT, SLIGHTLY ANXIOUS MOOD  Vitals reviewed.         Assessment & Plan:

## 2014-12-30 NOTE — Progress Notes (Signed)
CC'D TO PCP °

## 2014-12-30 NOTE — Patient Instructions (Signed)
DRINK WATER TO KEEP YOUR URINE LIGHT YELLOW.  DRINK HALF AS MUCH CRANBERRY JUICE AND AVOID SODAS.  CONTINUE YOUR WEIGHT LOSS EFFORTS. LOSE 10 LBS.  FOLLOW A HIGH FIBER DIET. AVOID ITEMS THAT CAUSE BLOATING & GAS. SEE INFO BELOW.  TAKE COLACE with a stimulant laxative THREE TIMES A DAY.  CONTINUE LINZESS. Take with food.  Use ANUSOL cream FOUR TIMES A DAY FOR 12 DAYS THEN USE PREPARATION H AS NEEDED. ONLY USE ANUSOL FOR 2 DAYS EVERY 3 TO 4 MOS.  FOLLOW UP IN 3 MOS.   High-Fiber Diet A high-fiber diet changes your normal diet to include more whole grains, legumes, fruits, and vegetables. Changes in the diet involve replacing refined carbohydrates with unrefined foods. The calorie level of the diet is essentially unchanged. The Dietary Reference Intake (recommended amount) for adult males is 38 grams per day. For adult females, it is 25 grams per day. Pregnant and lactating women should consume 28 grams of fiber per day. Fiber is the intact part of a plant that is not broken down during digestion. Functional fiber is fiber that has been isolated from the plant to provide a beneficial effect in the body. PURPOSE  Increase stool bulk.   Ease and regulate bowel movements.   Lower cholesterol.  REDUCE RISK OF COLON CANCER  INDICATIONS THAT YOU NEED MORE FIBER  Constipation and hemorrhoids.   Uncomplicated diverticulosis (intestine condition) and irritable bowel syndrome.   Weight management.   As a protective measure against hardening of the arteries (atherosclerosis), diabetes, and cancer.   GUIDELINES FOR INCREASING FIBER IN THE DIET  Start adding fiber to the diet slowly. A gradual increase of about 5 more grams (2 slices of whole-wheat bread, 2 servings of most fruits or vegetables, or 1 bowl of high-fiber cereal) per day is best. Too rapid an increase in fiber may result in constipation, flatulence, and bloating.   Drink enough water and fluids to keep your urine clear or pale  yellow. Water, juice, or caffeine-free drinks are recommended. Not drinking enough fluid may cause constipation.   Eat a variety of high-fiber foods rather than one type of fiber.   Try to increase your intake of fiber through using high-fiber foods rather than fiber pills or supplements that contain small amounts of fiber.   The goal is to change the types of food eaten. Do not supplement your present diet with high-fiber foods, but replace foods in your present diet.   INCLUDE A VARIETY OF FIBER SOURCES  Replace refined and processed grains with whole grains, canned fruits with fresh fruits, and incorporate other fiber sources. White rice, white breads, and most bakery goods contain little or no fiber.   Brown whole-grain rice, buckwheat oats, and many fruits and vegetables are all good sources of fiber. These include: broccoli, Brussels sprouts, cabbage, cauliflower, beets, sweet potatoes, white potatoes (skin on), carrots, tomatoes, eggplant, squash, berries, fresh fruits, and dried fruits.   Cereals appear to be the richest source of fiber. Cereal fiber is found in whole grains and bran. Bran is the fiber-rich outer coat of cereal grain, which is largely removed in refining. In whole-grain cereals, the bran remains. In breakfast cereals, the largest amount of fiber is found in those with "bran" in their names. The fiber content is sometimes indicated on the label.   You may need to include additional fruits and vegetables each day.   In baking, for 1 cup white flour, you may use the following substitutions:  1 cup whole-wheat flour minus 2 tablespoons.   1/2 cup white flour plus 1/2 cup whole-wheat flour.

## 2014-12-30 NOTE — Assessment & Plan Note (Signed)
SYMPTOMS NOT CONTROLLED DUE TO CONSTIPATION NOT IDEALLY ADDRESSED-MILD DISEASE AND NO INDICATION FOR SURGICAL MANAGEMENT.  DRINK WATER TO KEEP YOUR URINE LIGHT YELLOW. DRINK HALF AS MUCH CRANBERRY JUICE AND AVOID SODAS. CONTINUE WEIGHT LOSS EFFORTS. LOSE 10 LBS. FOLLOW A HIGH FIBER DIET. AVOID ITEMS THAT CAUSE BLOATING & GAS.  TAKE COLACE with a stimulant laxative THREE TIMES A DAY. CONTINUE LINZESS. Take with food. Use ANUSOL cream FOUR TIMES A DAY FOR 12 DAYS THEN USE PREPARATION H AS NEEDED. ONLY USE ANUSOL FOR 12 DAYS EVERY 3 TO 4 MOS. DISCUSSED BENEFITS, RISKS, AND MANAGEMENT OF HEMORRHOIDS. FOLLOW UP IN 3 MOS.

## 2014-12-30 NOTE — Assessment & Plan Note (Signed)
SYMPTOMS NOT CONTROLLED AND EXACERBATING HEMORRHOID FLARES.  DRINK WATER TO KEEP YOUR URINE LIGHT YELLOW. DRINK HALF AS MUCH CRANBERRY JUICE AND AVOID SODAS. CONTINUE WEIGHT LOSS EFFORTS. LOSE 10 LBS. FOLLOW A HIGH FIBER DIET. AVOID ITEMS THAT CAUSE BLOATING & GAS.  TAKE COLACE with a stimulant laxative THREE TIMES A DAY. CONTINUE LINZESS. Take with food. Use ANUSOL cream FOUR TIMES A DAY FOR 12 DAYS THEN USE PREPARATION H AS NEEDED. ONLY USE ANUSOL FOR 12 DAYS EVERY 3 TO 4 MOS. FOLLOW UP IN 3 MOS.

## 2014-12-31 DIAGNOSIS — R109 Unspecified abdominal pain: Secondary | ICD-10-CM | POA: Diagnosis not present

## 2015-01-03 DIAGNOSIS — H04123 Dry eye syndrome of bilateral lacrimal glands: Secondary | ICD-10-CM | POA: Diagnosis not present

## 2015-01-03 DIAGNOSIS — D8689 Sarcoidosis of other sites: Secondary | ICD-10-CM | POA: Diagnosis not present

## 2015-01-10 DIAGNOSIS — D869 Sarcoidosis, unspecified: Secondary | ICD-10-CM | POA: Diagnosis not present

## 2015-01-10 DIAGNOSIS — J40 Bronchitis, not specified as acute or chronic: Secondary | ICD-10-CM | POA: Diagnosis not present

## 2015-01-11 ENCOUNTER — Other Ambulatory Visit: Payer: Self-pay | Admitting: Adult Health

## 2015-01-28 DIAGNOSIS — R300 Dysuria: Secondary | ICD-10-CM | POA: Diagnosis not present

## 2015-01-28 DIAGNOSIS — D509 Iron deficiency anemia, unspecified: Secondary | ICD-10-CM | POA: Diagnosis not present

## 2015-01-28 DIAGNOSIS — Z8249 Family history of ischemic heart disease and other diseases of the circulatory system: Secondary | ICD-10-CM | POA: Diagnosis not present

## 2015-01-28 DIAGNOSIS — R5383 Other fatigue: Secondary | ICD-10-CM | POA: Diagnosis not present

## 2015-01-28 DIAGNOSIS — E7800 Pure hypercholesterolemia, unspecified: Secondary | ICD-10-CM | POA: Diagnosis not present

## 2015-01-28 DIAGNOSIS — R531 Weakness: Secondary | ICD-10-CM | POA: Diagnosis not present

## 2015-01-28 DIAGNOSIS — J45909 Unspecified asthma, uncomplicated: Secondary | ICD-10-CM | POA: Diagnosis not present

## 2015-01-28 DIAGNOSIS — Z79899 Other long term (current) drug therapy: Secondary | ICD-10-CM | POA: Diagnosis not present

## 2015-01-28 DIAGNOSIS — F17200 Nicotine dependence, unspecified, uncomplicated: Secondary | ICD-10-CM | POA: Diagnosis not present

## 2015-01-28 DIAGNOSIS — N3000 Acute cystitis without hematuria: Secondary | ICD-10-CM | POA: Diagnosis not present

## 2015-01-28 DIAGNOSIS — I10 Essential (primary) hypertension: Secondary | ICD-10-CM | POA: Diagnosis not present

## 2015-01-28 DIAGNOSIS — N390 Urinary tract infection, site not specified: Secondary | ICD-10-CM | POA: Diagnosis not present

## 2015-01-28 DIAGNOSIS — E119 Type 2 diabetes mellitus without complications: Secondary | ICD-10-CM | POA: Diagnosis not present

## 2015-02-08 ENCOUNTER — Encounter (HOSPITAL_COMMUNITY): Payer: Self-pay | Admitting: *Deleted

## 2015-02-08 ENCOUNTER — Inpatient Hospital Stay (HOSPITAL_COMMUNITY)
Admission: AD | Admit: 2015-02-08 | Discharge: 2015-02-08 | Disposition: A | Discharge status: Home / Self Care | Payer: Medicare Other | Source: Ambulatory Visit | Attending: Obstetrics and Gynecology | Admitting: Obstetrics and Gynecology

## 2015-02-08 DIAGNOSIS — B9689 Other specified bacterial agents as the cause of diseases classified elsewhere: Secondary | ICD-10-CM | POA: Insufficient documentation

## 2015-02-08 DIAGNOSIS — D649 Anemia, unspecified: Secondary | ICD-10-CM | POA: Insufficient documentation

## 2015-02-08 DIAGNOSIS — R300 Dysuria: Secondary | ICD-10-CM | POA: Diagnosis not present

## 2015-02-08 DIAGNOSIS — Z87891 Personal history of nicotine dependence: Secondary | ICD-10-CM | POA: Insufficient documentation

## 2015-02-08 DIAGNOSIS — N760 Acute vaginitis: Secondary | ICD-10-CM | POA: Diagnosis not present

## 2015-02-08 DIAGNOSIS — A499 Bacterial infection, unspecified: Secondary | ICD-10-CM

## 2015-02-08 LAB — URINALYSIS, ROUTINE W REFLEX MICROSCOPIC
BILIRUBIN URINE: NEGATIVE
Glucose, UA: NEGATIVE mg/dL
Ketones, ur: NEGATIVE mg/dL
Leukocytes, UA: NEGATIVE
Nitrite: NEGATIVE
PROTEIN: NEGATIVE mg/dL
Specific Gravity, Urine: <1.005 — ABNORMAL LOW (ref 1.005–1.030)
UROBILINOGEN UA: 0.2 mg/dL (ref 0.0–1.0)
pH: 5.5 (ref 5.0–8.0)

## 2015-02-08 LAB — CBC
HCT: 30.8 % — ABNORMAL LOW (ref 36.0–46.0)
Hemoglobin: 10.2 g/dL — ABNORMAL LOW (ref 12.0–15.0)
MCH: 28.3 pg (ref 26.0–34.0)
MCHC: 33.1 g/dL (ref 30.0–36.0)
MCV: 85.3 fL (ref 78.0–100.0)
Platelets: 284 10*3/uL (ref 150–400)
RBC: 3.61 MIL/uL — ABNORMAL LOW (ref 3.87–5.11)
RDW: 14.6 % (ref 11.5–15.5)
WBC: 10.3 10*3/uL (ref 4.0–10.5)

## 2015-02-08 LAB — WET PREP, GENITAL
Trich, Wet Prep: NONE SEEN
WBC, Wet Prep HPF POC: NONE SEEN
Yeast Wet Prep HPF POC: NONE SEEN

## 2015-02-08 LAB — POCT PREGNANCY, URINE: PREG TEST UR: NEGATIVE

## 2015-02-08 LAB — URINE MICROSCOPIC-ADD ON

## 2015-02-08 MED ORDER — METRONIDAZOLE 500 MG PO TABS: 500.0000 mg | ORAL_TABLET | Freq: Two times a day (BID) | ORAL | Status: DC

## 2015-02-08 NOTE — Discharge Instructions (Signed)

## 2015-02-08 NOTE — MAU Note (Signed)
Pt going to wait in radiology waiting room

## 2015-02-08 NOTE — MAU Note (Signed)
Pt states that 2 weeks ago she went to ED-was told she had a bacterial infection. Also told that she was anemic. Went to PCP-placed on vitamins and iron. States that she started feeling more weak than usual for a few days. Also having some burning and itching in vaginal area. Denies discharge. Burns with urination.

## 2015-02-08 NOTE — MAU Provider Note (Signed)
History     CSN: [id number]  Arrival date and time: 02/08/15 2038   First Provider Initiated Contact with Patient 02/08/15 2220      No chief complaint on file.  HPI Ms. [name] is a 49 y.o. [phone] who presents to MAU today with complaint of burning with urination and fatigue. The patient states that she was seen in the ED and given a medication that turned her urine orange. She denies any other medications. She states that she has had the burning for a few days. She denies vaginal bleeding, discharge, increased frequency or urination, pelvic pain or fever. She is sexually active.    OB History    Gravida Para Term Preterm AB TAB SAB Ectopic Multiple Living   6 2   4  2   2       Past Medical History  Diagnosis Date  . Sarcoidosis ([place] )   . Diabetes mellitus   . Hypertension   . Asthma   . Abnormal uterine bleeding (AUB) 11/26/2012  . BV (bacterial vaginosis) 11/26/2012  . Fatigue 12/24/2012  . Other and unspecified ovarian cyst 01/01/2013  . Fibroids 01/01/2013  . Hemorrhoid 01/01/2013  . Vaginal discharge 02/12/2013    +yeast  . Constipation   . GERD (gastroesophageal reflux disease)   . 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 IBS (irritable bowel syndrome)     Past Surgical History  Procedure Laterality Date  . Tubal ligation    . Endometrial ablation    . Cholecystectomy    . Hemorrhoid surgery      banding  . Colonoscopy N/A 08/20/2013    Dr. Oneida Alar: normal mucosa in terminal ileum, mild diverticulosis in ascending colon, moderate sized hemorrhoids s/p banding  . Hemorrhoid banding  08/20/2013    Procedure: HEMORRHOID BANDING;  Surgeon: Danie Binder, MD;  Location: [facility];  Service: Endoscopy;;  . Hemorrhoid surgery N/A 12/25/2013    Procedure: EXTENSIVE HEMORRHOIDECTOMY;  Surgeon: Jamesetta So, MD;  Location: [facility];  Service: General;  Laterality: N/A;    Family History  Problem Relation Age  of Onset  . Diabetes Father   . Cancer Mother     throat  . Cancer Brother     lung  . Colon cancer Neg Hx   . Breast cancer Cousin     Social History  Substance Use Topics  . Smoking status: Former Smoker -- 1.00 packs/day for 6 years    Types: Cigarettes    Quit date: 12/22/2003  . Smokeless tobacco: Never Used     Comment: Quit  5 years  . Alcohol Use: Yes     Comment: beer once a month    Allergies:  Allergies  Allergen Reactions  . Shellfish Allergy Anaphylaxis    No prescriptions prior to admission    Review of Systems  Constitutional: Negative for fever and malaise/fatigue.  Gastrointestinal: Negative for nausea, vomiting, abdominal pain, diarrhea and constipation.  Genitourinary: Positive for dysuria. Negative for urgency, frequency and hematuria.       Neg - vaginal bleeding, discharge   Physical Exam   Blood pressure 126/58, pulse 86, temperature 98 F (36.7 C), temperature source Oral, resp. rate 18, height 5' 5.5" (1.664 m), weight 188 lb 6.4 oz (85.458 kg), SpO2 100 %.  Physical Exam  Nursing note and vitals reviewed. Constitutional: She is oriented to person, place, and time. She appears well-developed and well-nourished. No distress.  HENT:  Head: Normocephalic and atraumatic.  Cardiovascular: Normal rate.   Respiratory: Effort normal.  GI: Soft. She exhibits no distension and no mass. There is no tenderness. There is no rebound and no guarding.  Genitourinary: Uterus is not enlarged and not tender. Cervix exhibits no motion tenderness, no discharge and no friability. Right adnexum displays no mass and no tenderness. Left adnexum displays no mass and no tenderness. No bleeding in the vagina. No vaginal discharge found.  Neurological: She is alert and oriented to person, place, and time.  Skin: Skin is warm and dry. No erythema.  Psychiatric: She has a normal mood and affect.   Results for orders placed or performed during the hospital encounter of  02/08/15 (from the past 24 hour(s))  Urinalysis, Routine w reflex microscopic (not at [facility])     Status: Abnormal   Collection Time: 02/08/15  9:04 PM  Result Value Ref Range   Color, Urine YELLOW YELLOW   APPearance CLEAR CLEAR   Specific Gravity, Urine <1.005 (L) 1.005 - 1.030   pH 5.5 5.0 - 8.0   Glucose, UA NEGATIVE NEGATIVE mg/dL   Hgb urine dipstick SMALL (A) NEGATIVE   Bilirubin Urine NEGATIVE NEGATIVE   Ketones, ur NEGATIVE NEGATIVE mg/dL   Protein, ur NEGATIVE NEGATIVE mg/dL   Urobilinogen, UA 0.2 0.0 - 1.0 mg/dL   Nitrite NEGATIVE NEGATIVE   Leukocytes, UA NEGATIVE NEGATIVE  Urine microscopic-add on     Status: None   Collection Time: 02/08/15  9:04 PM  Result Value Ref Range   Squamous Epithelial / LPF RARE RARE   WBC, UA 0-2 <3 WBC/hpf   Bacteria, UA RARE RARE  Pregnancy, urine POC     Status: None   Collection Time: 02/08/15  9:13 PM  Result Value Ref Range   Preg Test, Ur NEGATIVE NEGATIVE  CBC     Status: Abnormal   Collection Time: 02/08/15 10:07 PM  Result Value Ref Range   WBC 10.3 4.0 - 10.5 K/uL   RBC 3.61 (L) 3.87 - 5.11 MIL/uL   Hemoglobin 10.2 (L) 12.0 - 15.0 g/dL   HCT 30.8 (L) 36.0 - 46.0 %   MCV 85.3 78.0 - 100.0 fL   MCH 28.3 26.0 - 34.0 pg   MCHC 33.1 30.0 - 36.0 g/dL   RDW 14.6 11.5 - 15.5 %   Platelets 284 150 - 400 K/uL  Wet prep, genital     Status: Abnormal   Collection Time: 02/08/15 10:29 PM  Result Value Ref Range   Yeast Wet Prep HPF POC NONE SEEN NONE SEEN   Trich, Wet Prep NONE SEEN NONE SEEN   Clue Cells Wet Prep HPF POC MANY (A) NONE SEEN   WBC, Wet Prep HPF POC NONE SEEN NONE SEEN    MAU Course  Procedures None  MDM UPT - negative UA, wet prep, GC/Chlamydia, CBC, HIV and RPR today Urine culture ordered Assessment and Plan  A: Dysuria Anemia Bacterial vaginosis  P: Discharge home Rx for Flagyl given to patient Bleeding precautions and hygiene products to avoid for recurrence of BV discussed Continue OTC Iron  Supplements Urine culture, GC/Chlamydia, HIV and RPR pending at time of discharge Patient advised to follow-up with [facility] as planned for routine care Patient may return to MAU as needed or if her condition were to change or worsen   Luvenia Redden, PA-C  02/08/2015, 11:04 PM

## 2015-02-09 LAB — RPR: RPR: NONREACTIVE

## 2015-02-09 LAB — GC/CHLAMYDIA PROBE AMP (~~LOC~~) NOT AT ARMC
Chlamydia: NEGATIVE
NEISSERIA GONORRHEA: NEGATIVE

## 2015-02-09 LAB — HIV ANTIBODY (ROUTINE TESTING W REFLEX): HIV Screen 4th Generation wRfx: NONREACTIVE

## 2015-02-10 LAB — URINE CULTURE: Culture: NO GROWTH

## 2015-02-14 ENCOUNTER — Encounter: Payer: Self-pay | Admitting: Gastroenterology

## 2015-02-14 ENCOUNTER — Ambulatory Visit (INDEPENDENT_AMBULATORY_CARE_PROVIDER_SITE_OTHER): Payer: Medicare Other | Admitting: Adult Health

## 2015-02-14 ENCOUNTER — Encounter: Payer: Self-pay | Admitting: Adult Health

## 2015-02-14 VITALS — BP 140/76 | HR 94 | Ht 66.0 in | Wt 188.5 lb

## 2015-02-14 DIAGNOSIS — B9689 Other specified bacterial agents as the cause of diseases classified elsewhere: Secondary | ICD-10-CM | POA: Insufficient documentation

## 2015-02-14 DIAGNOSIS — L298 Other pruritus: Secondary | ICD-10-CM | POA: Diagnosis not present

## 2015-02-14 DIAGNOSIS — N760 Acute vaginitis: Secondary | ICD-10-CM | POA: Diagnosis not present

## 2015-02-14 DIAGNOSIS — A499 Bacterial infection, unspecified: Secondary | ICD-10-CM | POA: Diagnosis not present

## 2015-02-14 DIAGNOSIS — N898 Other specified noninflammatory disorders of vagina: Secondary | ICD-10-CM | POA: Insufficient documentation

## 2015-02-14 HISTORY — DX: Other specified noninflammatory disorders of vagina: N89.8

## 2015-02-14 LAB — POCT WET PREP (WET MOUNT)
Clue Cells Wet Prep Whiff POC: NEGATIVE
WBC, Wet Prep HPF POC: POSITIVE

## 2015-02-14 MED ORDER — NYSTATIN-TRIAMCINOLONE 100000-0.1 UNIT/GM-% EX CREA: 1.0000 "application " | TOPICAL_CREAM | Freq: Two times a day (BID) | CUTANEOUS | Status: DC

## 2015-02-14 NOTE — Patient Instructions (Signed)
Bacterial Vaginosis Bacterial vaginosis is a vaginal infection that occurs when the normal balance of bacteria in the vagina is disrupted. It results from an overgrowth of certain bacteria. This is the most common vaginal infection in women of childbearing age. Treatment is important to prevent complications, especially in pregnant women, as it can cause a premature delivery. CAUSES  Bacterial vaginosis is caused by an increase in harmful bacteria that are normally present in smaller amounts in the vagina. Several different kinds of bacteria can cause bacterial vaginosis. However, the reason that the condition develops is not fully understood. RISK FACTORS Certain activities or behaviors can put you at an increased risk of developing bacterial vaginosis, including:  Having a new sex partner or multiple sex partners.  Douching.  Using an intrauterine device (IUD) for contraception. Women do not get bacterial vaginosis from toilet seats, bedding, swimming pools, or contact with objects around them. SIGNS AND SYMPTOMS  Some women with bacterial vaginosis have no signs or symptoms. Common symptoms include:  Grey vaginal discharge.  A fishlike odor with discharge, especially after sexual intercourse.  Itching or burning of the vagina and vulva.  Burning or pain with urination. DIAGNOSIS  Your health care provider will take a medical history and examine the vagina for signs of bacterial vaginosis. A sample of vaginal fluid may be taken. Your health care provider will look at this sample under a microscope to check for bacteria and abnormal cells. A vaginal pH test may also be done.  TREATMENT  Bacterial vaginosis may be treated with antibiotic medicines. These may be given in the form of a pill or a vaginal cream. A second round of antibiotics may be prescribed if the condition comes back after treatment. Because bacterial vaginosis increases your risk for sexually transmitted diseases, getting  treated can help reduce your risk for chlamydia, gonorrhea, HIV, and herpes. HOME CARE INSTRUCTIONS   Only take over-the-counter or prescription medicines as directed by your health care provider.  If antibiotic medicine was prescribed, take it as directed. Make sure you finish it even if you start to feel better.  Tell all sexual partners that you have a vaginal infection. They should see their health care provider and be treated if they have problems, such as a mild rash or itching.  During treatment, it is important that you follow these instructions:  Avoid sexual activity or use condoms correctly.  Do not douche.  Avoid alcohol as directed by your health care provider.  Avoid breastfeeding as directed by your health care provider. SEEK MEDICAL CARE IF:   Your symptoms are not improving after 3 days of treatment.  You have increased discharge or pain.  You have a fever. MAKE SURE YOU:   Understand these instructions.  Will watch your condition.  Will get help right away if you are not doing well or get worse. FOR MORE INFORMATION  Centers for Disease Control and Prevention, Division of STD Prevention: [employer] American Sexual Health Association (ASHA): www.ashastd.org    This information is not intended to replace advice given to you by your health care provider. Make sure you discuss any questions you have with your health care provider.   Document Released: 03/19/2005 Document Revised: 04/09/2014 Document Reviewed: 10/29/2012 Elsevier Interactive Patient Education 2016 [place] flagyl Use mytrex cream Follow in january

## 2015-02-14 NOTE — Progress Notes (Signed)
Subjective:     Patient ID: [name], female   DOB: May 08, 1965, 49 y.o.   MRN: [id number]  HPI [name] is a 49 year old white female in complaining of vaginal discharge and itch,has been treated for BV and is taking iron for anemia,eats ice often.  Review of Systems Patient denies any headaches, hearing loss, fatigue, blurred vision, shortness of breath, chest pain, abdominal pain, problems with bowel movements, urination, or intercourse. No joint pain or mood swings. See HPI for positives. Reviewed past medical,surgical, social and family history. Reviewed medications and allergies.     Objective:   Physical Exam BP 140/76 mmHg  Pulse 94  Ht 5\' 6"  (1.676 m)  Wt 188 lb 8 oz (85.503 kg)  BMI 30.44 kg/m2 Skin warm and dry.Pelvic: external genitalia is normal in appearance no lesions, vagina: white discharge without odor,urethra has no lesions or masses noted, cervix:smooth and bulbous, uterus: normal size, shape and contour, non tender, no masses felt, adnexa: no masses or tenderness noted. Bladder is non tender and no masses felt. Wet prep: + for clue cells and +WBCs.     Assessment:    Vaginal discharge Vaginal itch BV    Plan:     Finish flagyl Rx mytrex cream 2-3 x daily prn Follow up in January for physical and labs then

## 2015-03-10 ENCOUNTER — Ambulatory Visit: Payer: Medicare Other | Admitting: Gastroenterology

## 2015-03-17 ENCOUNTER — Other Ambulatory Visit: Payer: Self-pay

## 2015-03-17 ENCOUNTER — Ambulatory Visit (INDEPENDENT_AMBULATORY_CARE_PROVIDER_SITE_OTHER): Payer: Medicare Other | Admitting: Gastroenterology

## 2015-03-17 ENCOUNTER — Encounter: Payer: Self-pay | Admitting: Gastroenterology

## 2015-03-17 VITALS — BP 113/74 | HR 94 | Temp 97.9°F | Ht 66.0 in | Wt 188.8 lb

## 2015-03-17 DIAGNOSIS — K219 Gastro-esophageal reflux disease without esophagitis: Secondary | ICD-10-CM | POA: Diagnosis not present

## 2015-03-17 DIAGNOSIS — D649 Anemia, unspecified: Secondary | ICD-10-CM

## 2015-03-17 DIAGNOSIS — K6289 Other specified diseases of anus and rectum: Secondary | ICD-10-CM | POA: Diagnosis not present

## 2015-03-17 DIAGNOSIS — K5901 Slow transit constipation: Secondary | ICD-10-CM

## 2015-03-17 NOTE — Assessment & Plan Note (Signed)
SYMPTOMS CONTROLLED/RESOLVED & DUE TO HEMORRHOIDS +/-FISSURE  DRINK WATER EAT FIBER AVOID CONSTIPATION. FOLLOW UP IN 4 MOS.

## 2015-03-17 NOTE — Assessment & Plan Note (Signed)
SYMPTOMS CONTROLLED/RESOLVED.  CONTINUE TO MONITOR SYMPTOMS. 

## 2015-03-17 NOTE — Progress Notes (Signed)
Subjective:    Patient ID: [name], female    DOB: 08-13-1965, 49 y.o.   MRN: [id number] Erica Burly, MD  HPI MAY PASS STINKY GAS. BMs: EVERY OTHER DAY, NO STRAINING. MAY EAT SEAFOOD AND HAVE NAUSEA/VOMTIING. NAUSEA: RARE. VOMITING: 1 WEEK AGO. HEARTBURN: NONE. MAY HAVE TROUBLE SWALLOWING PILLS BUT NOT FOOD.  BEEN ON IRON PILLS ALL THE TIME. BLOOD COUNT TRENDING DOWN SINCE JUL 2015. LAST RECTAL BLEEDING 2 MOS AGO.  PT DENIES FEVER, CHILLS, HEMATOCHEZIA, HEMATEMESIS, melena, diarrhea, CHEST PAIN, SHORTNESS OF BREATH, CHANGE IN BOWEL IN HABITS, constipation, OR abdominal pain.  Past Medical History  Diagnosis Date  . Sarcoidosis ([place])   . Diabetes mellitus   . Hypertension   . Asthma   . Abnormal uterine bleeding (AUB) 11/26/2012  . BV (bacterial vaginosis) 11/26/2012  . Fatigue 12/24/2012  . Other and unspecified ovarian cyst 01/01/2013  . Fibroids 01/01/2013  . Hemorrhoid 01/01/2013  . Vaginal discharge 02/12/2013    +yeast  . Constipation   . GERD (gastroesophageal reflux disease)   . 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 IBS (irritable bowel syndrome)   . Vaginal itching 02/14/2015   Past Surgical History  Procedure Laterality Date  . Tubal ligation    . Endometrial ablation    . Cholecystectomy    . Hemorrhoid surgery      banding  . Colonoscopy N/A 08/20/2013    Dr. Oneida Alar: normal mucosa in terminal ileum, mild diverticulosis in ascending colon, moderate sized hemorrhoids s/p banding  . Hemorrhoid banding  08/20/2013    Procedure: HEMORRHOID BANDING;  Surgeon: Danie Binder, MD;  Location: [facility];  Service: Endoscopy;;  . Hemorrhoid surgery N/A 12/25/2013    Procedure: EXTENSIVE HEMORRHOIDECTOMY;  Surgeon: Jamesetta So, MD;  Location: [facility] OR[facility];  Service: General;  Laterality: N/A;   Allergies  Allergen Reactions  . Shellfish Allergy Anaphylaxis   Current Outpatient Prescriptions  Medication Sig  Dispense Refill  . Ascorbic Acid (VITAMIN C) 100 MG tablet Take 100 mg by mouth daily.    Marland Kitchen aspirin EC 325 MG tablet Take 325 mg by mouth daily.    Marland Kitchen atorvastatin (LIPITOR) 20 MG tablet Take 20 mg by mouth at bedtime.     . benazepril (LOTENSIN) 10 MG tablet Take 10 mg by mouth daily.     . fenofibrate (TRICOR) 145 MG tablet Take 145 mg by mouth at bedtime.     Marland Kitchen ibuprofen (ADVIL,MOTRIN) 800 MG tablet TAKE ONE TABLET BY MOUTH EVERY 8 HOURS AS NEEDED    . IRON PO Take by mouth 3 (three) times daily.    . metFORMIN (GLUCOPHAGE) 1000 MG tablet Take 1,000 mg by mouth 2 (two) times daily with a meal.     . ranitidine (ZANTAC) 150 MG tablet Take 300 mg by mouth 2 (two) times daily.     .      .       Review of Systems PER HPI OTHERWISE ALL SYSTEMS ARE NEGATIVE.    Objective:   Physical Exam  Constitutional: She is oriented to person, place, and time. She appears well-developed and well-nourished. No distress.  HENT:  Head: Normocephalic and atraumatic.  Mouth/Throat: Oropharynx is clear and moist. No oropharyngeal exudate.  Eyes: Pupils are equal, round, and reactive to light. No scleral icterus.  Neck: Normal range of motion. Neck supple.  Cardiovascular: Normal rate, regular rhythm and normal heart sounds.  Pulmonary/Chest: Effort normal and breath sounds normal. No respiratory distress.  Abdominal: Soft. Bowel sounds are normal. She exhibits no distension. There is no tenderness.  Musculoskeletal: She exhibits no edema.  Lymphadenopathy:    She has no cervical adenopathy.  Neurological: She is alert and oriented to person, place, and time.  NO FOCAL DEFICITS  Psychiatric: She has a normal mood and affect.  Vitals reviewed.     Assessment & Plan:

## 2015-03-17 NOTE — Assessment & Plan Note (Addendum)
NO BRBPR OR MELENA.  FERRITIN TOMORROW EGD IN JAN 2017. DISCUSSED PROCEDURE, BENEFITS, & RISKS: < 1% chance of medication reaction, OR bleeding. FOLLOW UP IN 4 MOS.

## 2015-03-17 NOTE — Assessment & Plan Note (Signed)
SYMPTOMS FAIRLY WELL CONTROLLED.  DRINK WATER EAT FIBER AVOID CONSTIPATION. FOLLOW UP IN 4 MOS.

## 2015-03-17 NOTE — Patient Instructions (Addendum)
COMPLETE BLOOD DRAW TO CHECK IRON STORES.  DRINK WATER TO KEEP YOUR URINE LIGHT YELLOW.  EAT FIBER.  AVOID CONSTIPATION.  UPPER ENDOSCOPY IN JAN 2017.  FOLLOW UP IN 4 MOS.

## 2015-03-18 DIAGNOSIS — D649 Anemia, unspecified: Secondary | ICD-10-CM | POA: Diagnosis not present

## 2015-03-18 NOTE — Progress Notes (Signed)
cc'ed to pcp °

## 2015-03-18 NOTE — Progress Notes (Signed)
ON RECALL  °

## 2015-03-19 LAB — FERRITIN: FERRITIN: 29 ng/mL (ref 15–150)

## 2015-03-21 ENCOUNTER — Other Ambulatory Visit: Payer: Medicare Other | Admitting: Women's Health

## 2015-03-21 ENCOUNTER — Telehealth: Payer: Self-pay

## 2015-03-21 NOTE — Progress Notes (Signed)
FERRITIN 29-NORMOCYTIC ANEMIA. EGD +/- GIVENS JAN 2017. Hb NORMAL IN 2009.

## 2015-03-21 NOTE — Telephone Encounter (Signed)
Received the [employer] results via fax and placed on Dr. Oneida Alar desk. ( Ferritin is 29)

## 2015-03-21 NOTE — Telephone Encounter (Signed)
PLEASE CALL PT. HER IRON LEVEL IS NORMAL.

## 2015-03-22 NOTE — Telephone Encounter (Signed)
Pt is aware.  

## 2015-03-29 ENCOUNTER — Emergency Department (HOSPITAL_COMMUNITY)
Admission: EM | Admit: 2015-03-29 | Discharge: 2015-03-29 | Disposition: A | Discharge status: Home / Self Care | Payer: Medicare Other | Attending: Emergency Medicine | Admitting: Emergency Medicine

## 2015-03-29 ENCOUNTER — Telehealth: Payer: Self-pay | Admitting: Gastroenterology

## 2015-03-29 ENCOUNTER — Encounter (HOSPITAL_COMMUNITY): Payer: Self-pay | Admitting: *Deleted

## 2015-03-29 DIAGNOSIS — Z872 Personal history of diseases of the skin and subcutaneous tissue: Secondary | ICD-10-CM | POA: Insufficient documentation

## 2015-03-29 DIAGNOSIS — I10 Essential (primary) hypertension: Secondary | ICD-10-CM | POA: Insufficient documentation

## 2015-03-29 DIAGNOSIS — K648 Other hemorrhoids: Secondary | ICD-10-CM | POA: Diagnosis not present

## 2015-03-29 DIAGNOSIS — E1340 Other specified diabetes mellitus with diabetic neuropathy, unspecified: Secondary | ICD-10-CM | POA: Insufficient documentation

## 2015-03-29 DIAGNOSIS — Z7984 Long term (current) use of oral hypoglycemic drugs: Secondary | ICD-10-CM | POA: Diagnosis not present

## 2015-03-29 DIAGNOSIS — J45909 Unspecified asthma, uncomplicated: Secondary | ICD-10-CM | POA: Insufficient documentation

## 2015-03-29 DIAGNOSIS — Z87891 Personal history of nicotine dependence: Secondary | ICD-10-CM | POA: Insufficient documentation

## 2015-03-29 DIAGNOSIS — Z79899 Other long term (current) drug therapy: Secondary | ICD-10-CM | POA: Insufficient documentation

## 2015-03-29 DIAGNOSIS — Z8742 Personal history of other diseases of the female genital tract: Secondary | ICD-10-CM | POA: Diagnosis not present

## 2015-03-29 DIAGNOSIS — Z862 Personal history of diseases of the blood and blood-forming organs and certain disorders involving the immune mechanism: Secondary | ICD-10-CM | POA: Insufficient documentation

## 2015-03-29 DIAGNOSIS — K219 Gastro-esophageal reflux disease without esophagitis: Secondary | ICD-10-CM | POA: Diagnosis not present

## 2015-03-29 DIAGNOSIS — Z86018 Personal history of other benign neoplasm: Secondary | ICD-10-CM | POA: Insufficient documentation

## 2015-03-29 DIAGNOSIS — Z7982 Long term (current) use of aspirin: Secondary | ICD-10-CM | POA: Insufficient documentation

## 2015-03-29 DIAGNOSIS — K625 Hemorrhage of anus and rectum: Secondary | ICD-10-CM | POA: Diagnosis not present

## 2015-03-29 LAB — COMPREHENSIVE METABOLIC PANEL
ALBUMIN: 4.3 g/dL (ref 3.5–5.0)
ALK PHOS: 40 U/L (ref 38–126)
ALT: 29 U/L (ref 14–54)
ANION GAP: 9 (ref 5–15)
AST: 27 U/L (ref 15–41)
BUN: 11 mg/dL (ref 6–20)
CALCIUM: 9.7 mg/dL (ref 8.9–10.3)
CHLORIDE: 107 mmol/L (ref 101–111)
CO2: 25 mmol/L (ref 22–32)
CREATININE: 0.74 mg/dL (ref 0.44–1.00)
GFR calc non Af Amer: >60 mL/min (ref >60)
GLUCOSE: 96 mg/dL (ref 65–99)
Potassium: 3.8 mmol/L (ref 3.5–5.1)
SODIUM: 141 mmol/L (ref 135–145)
Total Bilirubin: 0.2 mg/dL — ABNORMAL LOW (ref 0.3–1.2)
Total Protein: 8.1 g/dL (ref 6.5–8.1)

## 2015-03-29 LAB — CBC WITH DIFFERENTIAL/PLATELET
Basophils Absolute: 0 10*3/uL (ref 0.0–0.1)
Basophils Relative: 0 %
Eosinophils Absolute: 0.1 10*3/uL (ref 0.0–0.7)
Eosinophils Relative: 1 %
HCT: 35 % — ABNORMAL LOW (ref 36.0–46.0)
HEMOGLOBIN: 11.8 g/dL — AB (ref 12.0–15.0)
LYMPHS ABS: 3.3 10*3/uL (ref 0.7–4.0)
LYMPHS PCT: 41 %
MCH: 29 pg (ref 26.0–34.0)
MCHC: 33.7 g/dL (ref 30.0–36.0)
MCV: 86 fL (ref 78.0–100.0)
MONO ABS: 0.6 10*3/uL (ref 0.1–1.0)
MONOS PCT: 8 %
NEUTROS ABS: 4.1 10*3/uL (ref 1.7–7.7)
NEUTROS PCT: 50 %
PLATELETS: 265 10*3/uL (ref 150–400)
RBC: 4.07 MIL/uL (ref 3.87–5.11)
RDW: 14.6 % (ref 11.5–15.5)
WBC: 8.1 10*3/uL (ref 4.0–10.5)

## 2015-03-29 MED ORDER — STARCH 51 % RE SUPP: 1.0000 | RECTAL | Status: DC | PRN

## 2015-03-29 NOTE — ED Notes (Addendum)
Pt reports rectal bleeding. Noticed blood in toilet yesterday. Hx of hemorrhoids.

## 2015-03-29 NOTE — Telephone Encounter (Signed)
Pt called asking for SF. I told her SF was not in the office that she was off today. Patient said that she started bleeding again and she went to the ED last night, but she left because she got tired of waiting. She said that blood was just "pouring out of her" and she was "weak". I told her that SF was not in the office and the nurse was unable to come to the phone at the moment. I transferred her call to VM. Please advise

## 2015-03-29 NOTE — Discharge Instructions (Signed)
Return here if your bleeding worsens. Call Dr. Oneida Alar tomorrow for recheck  Hemorrhoids Hemorrhoids are puffy (swollen) veins around the rectum or anus. Hemorrhoids can cause pain, itching, bleeding, or irritation. HOME CARE  Eat foods with fiber, such as whole grains, beans, nuts, fruits, and vegetables. Ask your doctor about taking products with added fiber in them (fibersupplements).  Drink enough fluid to keep your pee (urine) clear or pale yellow.  Exercise often.  Go to the bathroom when you have the urge to poop. Do not wait.  Avoid straining to poop (bowel movement).  Keep the butt area dry and clean. Use wet toilet paper or moist paper towels.  Medicated creams and medicine inserted into the anus (anal suppository) may be used or applied as told.  Only take medicine as told by your doctor.  Take a warm water bath (sitz bath) for 15-20 minutes to ease pain. Do this 3-4 times a day.  Place ice packs on the area if it is tender or puffy. Use the ice packs between the warm water baths.  Put ice in a plastic bag.  Place a towel between your skin and the bag.  Leave the ice on for 15-20 minutes, 03-04 times a day.  Do not use a donut-shaped pillow or sit on the toilet for a long time. GET HELP RIGHT AWAY IF:   You have more pain that is not controlled by treatment or medicine.  You have bleeding that will not stop.  You have trouble or are unable to poop (bowel movement).  You have pain or puffiness outside the area of the hemorrhoids. MAKE SURE YOU:   Understand these instructions.  Will watch your condition.  Will get help right away if you are not doing well or get worse.   This information is not intended to replace advice given to you by your health care provider. Make sure you discuss any questions you have with your health care provider.   Document Released: 12/27/2007 Document Revised: 03/05/2012 Document Reviewed: 01/29/2012 Elsevier Interactive  Patient Education 2016 Elsevier Inc.  Gastrointestinal Bleeding Gastrointestinal (GI) bleeding means there is bleeding somewhere along the digestive tract, between the mouth and anus. CAUSES  There are many different problems that can cause GI bleeding. Possible causes include:  Esophagitis. This is inflammation, irritation, or swelling of the esophagus.  Hemorrhoids.These are veins that are full of blood (engorged) in the rectum. They cause pain, inflammation, and may bleed.  Anal fissures.These are areas of painful tearing which may bleed. They are often caused by passing hard stool.  Diverticulosis.These are pouches that form on the colon over time, with age, and may bleed significantly.  Diverticulitis.This is inflammation in areas with diverticulosis. It can cause pain, fever, and bloody stools, although bleeding is rare.  Polyps and cancer. Colon cancer often starts out as precancerous polyps.  Gastritis and ulcers.Bleeding from the upper gastrointestinal tract (near the stomach) may travel through the intestines and produce black, sometimes tarry, often bad smelling stools. In certain cases, if the bleeding is fast enough, the stools may not be black, but red. This condition may be life-threatening. SYMPTOMS   Vomiting bright red blood or material that looks like coffee grounds.  Bloody, black, or tarry stools. DIAGNOSIS  Your caregiver may diagnose your condition by taking your history and performing a physical exam. More tests may be needed, including:  X-rays and other imaging tests.  Esophagogastroduodenoscopy (EGD). This test uses a flexible, lighted tube to look at  your esophagus, stomach, and small intestine.  Colonoscopy. This test uses a flexible, lighted tube to look at your colon. TREATMENT  Treatment depends on the cause of your bleeding.   For bleeding from the esophagus, stomach, small intestine, or colon, the caregiver doing your EGD or colonoscopy may  be able to stop the bleeding as part of the procedure.  Inflammation or infection of the colon can be treated with medicines.  Many rectal problems can be treated with creams, suppositories, or warm baths.  Surgery is sometimes needed.  Blood transfusions are sometimes needed if you have lost a lot of blood. If bleeding is slow, you may be allowed to go home. If there is a lot of bleeding, you will need to stay in the hospital for observation. HOME CARE INSTRUCTIONS   Take any medicines exactly as prescribed.  Keep your stools soft by eating foods that are high in fiber. These foods include whole grains, legumes, fruits, and vegetables. Prunes (1 to 3 a day) work well for many people.  Drink enough fluids to keep your urine clear or pale yellow. SEEK IMMEDIATE MEDICAL CARE IF:   Your bleeding increases.  You feel lightheaded, weak, or you faint.  You have severe cramps in your back or abdomen.  You pass large blood clots in your stool.  Your problems are getting worse. MAKE SURE YOU:   Understand these instructions.  Will watch your condition.  Will get help right away if you are not doing well or get worse.   This information is not intended to replace advice given to you by your health care provider. Make sure you discuss any questions you have with your health care provider.   Document Released: 03/16/2000 Document Revised: 03/05/2012 Document Reviewed: 09/06/2014 Elsevier Interactive Patient Education [employer].

## 2015-03-29 NOTE — ED Notes (Signed)
Pt states understanding of care given and follow up instructions 

## 2015-03-29 NOTE — ED Provider Notes (Signed)
CSN: [id number]     Arrival date & time 03/29/15  1516 History   First MD Initiated Contact with Patient 03/29/15 1913     Chief Complaint  Patient presents with  . Rectal Bleeding      HPI  She presents for evaluation of rectal bleeding. History of hemorrhoids. Had banding done by Dr. Oneida Alar last year. Ultimately had hemorrhoid urgent with Dr. Arnoldo Morale last year as well. Had some intermittent bleeding earlier that she had a side again to be due to some hemorrhoids followed by Dr. Carlyon Prows fields.  Symptoms started yesterday and today 3 times after bowel movements. No bleeding in between bowel movements. No abdominal or perirectal pain. Treatment anemia. Has been previous on iron. Is not weak lightheaded or syncopal. No hard or difficult to pass bowel movements. Does take a stool softener twice a day. Previous colonoscopy showed internal hemorrhoids and previous banding. CT did not demonstrate any structural amount is of the colon. Has had colonoscopy which showed only few diverticuli as well. Never had diverticular bleeding to her knowledge or per her chart.   Past Medical History  Diagnosis Date  . Sarcoidosis ([place])   . Diabetes mellitus   . Hypertension   . Asthma   . Abnormal uterine bleeding (AUB) 11/26/2012  . BV (bacterial vaginosis) 11/26/2012  . Fatigue 12/24/2012  . Other and unspecified ovarian cyst 01/01/2013  . Fibroids 01/01/2013  . Hemorrhoid 01/01/2013  . Vaginal discharge 02/12/2013    +yeast  . Constipation   . GERD (gastroesophageal reflux disease)   . 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 IBS (irritable bowel syndrome)   . Vaginal itching 02/14/2015   Past Surgical History  Procedure Laterality Date  . Tubal ligation    . Endometrial ablation    . Cholecystectomy    . Hemorrhoid surgery      banding  . Colonoscopy N/A 08/20/2013    Dr. Oneida Alar: normal mucosa in terminal ileum, mild diverticulosis in ascending  colon, moderate sized hemorrhoids s/p banding  . Hemorrhoid banding  08/20/2013    Procedure: HEMORRHOID BANDING;  Surgeon: Danie Binder, MD;  [facility];  Service: Endoscopy;;  . Hemorrhoid surgery N/A 12/25/2013    Procedure: EXTENSIVE HEMORRHOIDECTOMY;  Surgeon: Jamesetta So, MD;  Location: [facility];  Service: General;  Laterality: N/A;   Family History  Problem Relation Age of Onset  . Diabetes Father   . Cancer Mother     throat  . Cancer Brother     lung  . Colon cancer Neg Hx   . Breast cancer Cousin   . Cancer Maternal Aunt   . Diabetes Maternal Aunt    Social History  Substance Use Topics  . Smoking status: Former Smoker -- 1.00 packs/day for 6 years    Types: Cigarettes    Quit date: 12/22/2003  . Smokeless tobacco: Never Used     Comment: Quit  5 years  . Alcohol Use: Yes     Comment: beer once a month   OB History    Gravida Para Term Preterm AB TAB SAB Ectopic Multiple Living   6 2   4  2   2      Review of Systems  Constitutional: Negative for fever, chills, diaphoresis, appetite change and fatigue.  HENT: Negative for mouth sores, sore throat and trouble swallowing.   Eyes: Negative for visual disturbance.  Respiratory: Negative for cough, chest tightness,  shortness of breath and wheezing.   Cardiovascular: Negative for chest pain.  Gastrointestinal: Positive for anal bleeding. Negative for nausea, vomiting, abdominal pain, diarrhea and abdominal distention.  Endocrine: Negative for polydipsia, polyphagia and polyuria.  Genitourinary: Negative for dysuria, frequency and hematuria.  Musculoskeletal: Negative for gait problem.  Skin: Negative for color change, pallor and rash.  Neurological: Negative for dizziness, syncope, light-headedness and headaches.  Hematological: Does not bruise/bleed easily.  Psychiatric/Behavioral: Negative for behavioral problems and confusion.      Allergies  Shellfish allergy  Home Medications   Prior to  Admission medications   Medication Sig Start Date End Date Taking? Authorizing Provider  Ascorbic Acid (VITAMIN C) 100 MG tablet Take 100 mg by mouth daily.    Historical Provider, MD  aspirin EC 325 MG tablet Take 325 mg by mouth daily.    Historical Provider, MD  atorvastatin (LIPITOR) 20 MG tablet Take 20 mg by mouth at bedtime.     Historical Provider, MD  benazepril (LOTENSIN) 10 MG tablet Take 10 mg by mouth daily.     Historical Provider, MD  fenofibrate (TRICOR) 145 MG tablet Take 145 mg by mouth at bedtime.     Historical Provider, MD  ibuprofen (ADVIL,MOTRIN) 800 MG tablet TAKE ONE TABLET BY MOUTH EVERY 8 HOURS AS NEEDED 01/11/15   Estill Dooms, NP  IRON PO Take by mouth 3 (three) times daily.    Historical Provider, MD  metFORMIN (GLUCOPHAGE) 1000 MG tablet Take 1,000 mg by mouth 2 (two) times daily with a meal.     Historical Provider, MD  metroNIDAZOLE (FLAGYL) 500 MG tablet Take 1 tablet (500 mg total) by mouth 2 (two) times daily. Patient not taking: Reported on 03/17/2015 02/08/15   Luvenia Redden, PA-C  nystatin-triamcinolone [facility] II) cream Apply 1 application topically 2 (two) times daily. Patient not taking: Reported on 03/17/2015 02/14/15   Estill Dooms, NP  ranitidine (ZANTAC) 150 MG tablet Take 300 mg by mouth 2 (two) times daily.     Historical Provider, MD  starch (ANUSOL) 51 % suppository Place 1 suppository rectally as needed. 03/29/15   Tanna Furry, MD   BP 127/65 mmHg  Pulse 76  Temp(Src) 97.7 F (36.5 C) (Oral)  Resp 14  Ht 5\' 6"  (1.676 m)  Wt 180 lb (81.647 kg)  BMI 29.07 kg/m2  SpO2 100% Physical Exam  Constitutional: She is oriented to person, place, and time. She appears well-developed and well-nourished. No distress.  HENT:  Head: Normocephalic.  Eyes: Conjunctivae are normal. Pupils are equal, round, and reactive to light. No scleral icterus.  Neck: Normal range of motion. Neck supple. No thyromegaly present.  Cardiovascular: Normal  rate and regular rhythm.  Exam reveals no gallop and no friction rub.   No murmur heard. Pulmonary/Chest: Effort normal and breath sounds normal. No respiratory distress. She has no wheezes. She has no rales.  Abdominal: Soft. Bowel sounds are normal. She exhibits no distension. There is no tenderness. There is no rebound.  Genitourinary:     Musculoskeletal: Normal range of motion.  Neurological: She is alert and oriented to person, place, and time.  Skin: Skin is warm and dry. No rash noted.  Psychiatric: She has a normal mood and affect. Her behavior is normal.    ED Course  Procedures (including critical care time) Labs Review Labs Reviewed  CBC WITH DIFFERENTIAL/PLATELET - Abnormal; Notable for the following:    Hemoglobin 11.8 (*)    HCT 35.0 (*)  All other components within normal limits  COMPREHENSIVE METABOLIC PANEL - Abnormal; Notable for the following:    Total Bilirubin 0.2 (*)    All other components within normal limits    Imaging Review No results found. I have personally reviewed and evaluated these images and lab results as part of my medical decision-making.   EKG Interpretation None      MDM   Final diagnoses:  Rectal bleeding  Hemorrhoids, internal    " Higher than her most recent outpatient value. She does not orthostatic syncopal present were weak. Think is appropriate for outpatient treatment pain is all suppositories. Return if worsening bleeding, or any additional symptoms. Encouraged follow up with her GI physician.    Tanna Furry, MD 03/29/15 2002

## 2015-03-29 NOTE — Telephone Encounter (Signed)
Swollen hands should be addressed by PCP. Needs to use anusol or prep H cream. Check CBC now. If she has severe rectal bleeding that is associated with weakness, needs to seek medical attention. She has known hemorrhoids.

## 2015-03-29 NOTE — Telephone Encounter (Signed)
Noted  

## 2015-03-29 NOTE — Telephone Encounter (Signed)
Pt just left Vm that she has started bleeding again and is going to the hospital.

## 2015-03-29 NOTE — Telephone Encounter (Signed)
I called pt and she said she had blood pour out of her when she went to use the bathroom x 2 yesterday. Bright red blood. She is not constipated and she usually has 1-2 normal BM's daily. She said her hands are swollen, and her stomach feels right. No Bm yet today.  She went to the ED last night but they were so full, she did not stay. She has a hx of hemorrhoids. Please advise!

## 2015-03-30 ENCOUNTER — Telehealth: Payer: Self-pay | Admitting: Gastroenterology

## 2015-03-30 DIAGNOSIS — K644 Residual hemorrhoidal skin tags: Secondary | ICD-10-CM | POA: Diagnosis not present

## 2015-03-30 DIAGNOSIS — I10 Essential (primary) hypertension: Secondary | ICD-10-CM | POA: Diagnosis not present

## 2015-03-30 DIAGNOSIS — E119 Type 2 diabetes mellitus without complications: Secondary | ICD-10-CM | POA: Diagnosis not present

## 2015-03-30 NOTE — Telephone Encounter (Signed)
Forwarding to Dr. Fields to advise! 

## 2015-03-30 NOTE — Telephone Encounter (Signed)
Patient called to say that she needed to make OV with SF. I told her that SF next available would be 1/26. Patient said that she is scheduled for a procedure with SF on 1/11 and the ED told her that she needed to see SF before then. The call was disconnected. Patient doesn't want to see anyone but SF. Please advise if patient needs OV or if procedure needs to be moved to sooner.

## 2015-03-31 NOTE — Telephone Encounter (Signed)
PLEASE CALL PT. I REVIEWED HER ED VISIT. I DO NOT HAVE ANY APPT BEFORE HER ENDOSCOPY JAN 11. SHE SHOULD  USE PREPARATION H 4 TIMES A DAY FOR 10 DAYS TO RELIEVE RECTAL PRESSURE/ITCHING/BLEEDING. CALL IN 7 DAYS IF HER SYMPTOMS ARE NOT BETTER.

## 2015-03-31 NOTE — Telephone Encounter (Signed)
Pt is aware.  

## 2015-04-03 DIAGNOSIS — B9681 Helicobacter pylori [H. pylori] as the cause of diseases classified elsewhere: Secondary | ICD-10-CM

## 2015-04-03 HISTORY — DX: Helicobacter pylori (H. pylori) as the cause of diseases classified elsewhere: B96.81

## 2015-04-05 ENCOUNTER — Ambulatory Visit (INDEPENDENT_AMBULATORY_CARE_PROVIDER_SITE_OTHER): Payer: Medicare Other | Admitting: Adult Health

## 2015-04-05 ENCOUNTER — Encounter: Payer: Self-pay | Admitting: Adult Health

## 2015-04-05 VITALS — BP 140/80 | HR 92 | Ht 66.0 in | Wt 184.0 lb

## 2015-04-05 DIAGNOSIS — Z1212 Encounter for screening for malignant neoplasm of rectum: Secondary | ICD-10-CM | POA: Diagnosis not present

## 2015-04-05 DIAGNOSIS — N9489 Other specified conditions associated with female genital organs and menstrual cycle: Secondary | ICD-10-CM

## 2015-04-05 DIAGNOSIS — R319 Hematuria, unspecified: Secondary | ICD-10-CM | POA: Insufficient documentation

## 2015-04-05 DIAGNOSIS — K649 Unspecified hemorrhoids: Secondary | ICD-10-CM

## 2015-04-05 DIAGNOSIS — Z01419 Encounter for gynecological examination (general) (routine) without abnormal findings: Secondary | ICD-10-CM

## 2015-04-05 DIAGNOSIS — N898 Other specified noninflammatory disorders of vagina: Secondary | ICD-10-CM

## 2015-04-05 HISTORY — DX: Other specified noninflammatory disorders of vagina: N89.8

## 2015-04-05 HISTORY — DX: Hematuria, unspecified: R31.9

## 2015-04-05 HISTORY — DX: Unspecified hemorrhoids: K64.9

## 2015-04-05 LAB — HEMOCCULT GUIAC POC 1CARD (OFFICE): Fecal Occult Blood, POC: NEGATIVE

## 2015-04-05 LAB — POCT URINALYSIS DIPSTICK
Glucose, UA: NEGATIVE
Leukocytes, UA: NEGATIVE
Nitrite, UA: NEGATIVE
Protein, UA: NEGATIVE

## 2015-04-05 NOTE — Progress Notes (Signed)
Patient ID: [name], female   DOB: [birth year], 50 y.o.   MRN: [id number] History of Present Illness: Glennie is a 50 year old black female in for a well woman gyn exam.She had a normal pap with negative HPV 03/16/13.She has hemorrhoids and is having a EGD next week by Dr Oneida Alar.PCP is Dr Cheree Ditto has noticed a vaginal odor.   Current Medications, Allergies, Past Medical History, Past Surgical History, Family History and Social History were reviewed in [employer] record.     Review of Systems: Patient denies any headaches, hearing loss, fatigue, blurred vision, shortness of breath, chest pain, abdominal pain, problems with bowel movements, urination, or intercourse. No joint pain or mood swings.See HPI for positives.     Physical Exam:BP 140/80 mmHg  Pulse 92  Ht 5\' 6"  (1.676 m)  Wt 184 lb (83.462 kg)  BMI 29.71 kg/m2 urine: trace blood General:  Well developed, well nourished, no acute distress Skin:  Warm and dry Neck:  Midline trachea, normal thyroid, good ROM, no lymphadenopathy Lungs; Clear to auscultation bilaterally Breast:  No dominant palpable mass, retraction, or nipple discharge Cardiovascular: Regular rate and rhythm Abdomen:  Soft, non tender, no hepatosplenomegaly Pelvic:  External genitalia is normal in appearance, no lesions.  The vagina is normal in appearance. Urethra has no lesions or masses. The cervix is bulbous.  Uterus is felt to be normal size, shape, and contour.  No adnexal masses or tenderness noted.Bladder is non tender, no masses felt. Rectal: Good sphincter tone, no polyps, internal  hemorrhoids felt.  Hemoccult negative. Extremities/musculoskeletal:  No swelling or varicosities noted, no clubbing or cyanosis Psych:  No mood changes, alert and cooperative,seems happy   Impression: Well woman gyn exam no pap Hemorrhoids Hematuria Vaginal odor    Plan: Increase water UA C&S sent Mammogram yearly Pap and  physical in 1 year Labs with PCP

## 2015-04-05 NOTE — Patient Instructions (Signed)
Mammogram yearly Pap and physical in 1 year Increase water

## 2015-04-06 LAB — URINALYSIS, ROUTINE W REFLEX MICROSCOPIC
BILIRUBIN UA: NEGATIVE
GLUCOSE, UA: NEGATIVE
KETONES UA: NEGATIVE
Leukocytes, UA: NEGATIVE
Nitrite, UA: NEGATIVE
PH UA: 6 (ref 5.0–7.5)
PROTEIN UA: NEGATIVE
RBC UA: NEGATIVE
SPEC GRAV UA: 1.019 (ref 1.005–1.030)
UUROB: 1 mg/dL (ref 0.2–1.0)

## 2015-04-07 LAB — URINE CULTURE

## 2015-04-13 ENCOUNTER — Ambulatory Visit (HOSPITAL_COMMUNITY)
Admission: RE | Admit: 2015-04-13 | Discharge: 2015-04-13 | Disposition: A | Discharge status: Home Health | Payer: Medicare Other | Source: Ambulatory Visit | Attending: Gastroenterology | Admitting: Gastroenterology

## 2015-04-13 ENCOUNTER — Encounter (HOSPITAL_COMMUNITY)
Admission: RE | Disposition: A | Discharge status: Home Health | Payer: Self-pay | Source: Ambulatory Visit | Attending: Gastroenterology

## 2015-04-13 ENCOUNTER — Encounter (HOSPITAL_COMMUNITY): Payer: Self-pay | Admitting: *Deleted

## 2015-04-13 DIAGNOSIS — Z7982 Long term (current) use of aspirin: Secondary | ICD-10-CM | POA: Insufficient documentation

## 2015-04-13 DIAGNOSIS — Z7984 Long term (current) use of oral hypoglycemic drugs: Secondary | ICD-10-CM | POA: Diagnosis not present

## 2015-04-13 DIAGNOSIS — Z79899 Other long term (current) drug therapy: Secondary | ICD-10-CM | POA: Diagnosis not present

## 2015-04-13 DIAGNOSIS — B9681 Helicobacter pylori [H. pylori] as the cause of diseases classified elsewhere: Secondary | ICD-10-CM | POA: Insufficient documentation

## 2015-04-13 DIAGNOSIS — R1013 Epigastric pain: Secondary | ICD-10-CM | POA: Insufficient documentation

## 2015-04-13 DIAGNOSIS — E1140 Type 2 diabetes mellitus with diabetic neuropathy, unspecified: Secondary | ICD-10-CM | POA: Insufficient documentation

## 2015-04-13 DIAGNOSIS — J45909 Unspecified asthma, uncomplicated: Secondary | ICD-10-CM | POA: Insufficient documentation

## 2015-04-13 DIAGNOSIS — D649 Anemia, unspecified: Secondary | ICD-10-CM | POA: Diagnosis not present

## 2015-04-13 DIAGNOSIS — K2950 Unspecified chronic gastritis without bleeding: Secondary | ICD-10-CM | POA: Diagnosis not present

## 2015-04-13 DIAGNOSIS — K219 Gastro-esophageal reflux disease without esophagitis: Secondary | ICD-10-CM | POA: Insufficient documentation

## 2015-04-13 DIAGNOSIS — K648 Other hemorrhoids: Secondary | ICD-10-CM | POA: Diagnosis not present

## 2015-04-13 DIAGNOSIS — K921 Melena: Secondary | ICD-10-CM | POA: Diagnosis not present

## 2015-04-13 DIAGNOSIS — I10 Essential (primary) hypertension: Secondary | ICD-10-CM | POA: Diagnosis not present

## 2015-04-13 HISTORY — PX: HEMORRHOID BANDING: SHX5850

## 2015-04-13 HISTORY — DX: Helicobacter pylori (H. pylori) as the cause of diseases classified elsewhere: B96.81

## 2015-04-13 HISTORY — DX: Gastritis, unspecified, without bleeding: K29.70

## 2015-04-13 HISTORY — PX: ESOPHAGOGASTRODUODENOSCOPY: SHX5428

## 2015-04-13 HISTORY — PX: FLEXIBLE SIGMOIDOSCOPY: SHX5431

## 2015-04-13 LAB — GLUCOSE, CAPILLARY: Glucose-Capillary: 109 mg/dL — ABNORMAL HIGH (ref 65–99)

## 2015-04-13 SURGERY — EGD (ESOPHAGOGASTRODUODENOSCOPY)
Anesthesia: Moderate Sedation

## 2015-04-13 MED ORDER — LIDOCAINE VISCOUS 2 % MT SOLN
OROMUCOSAL | Status: AC
  Filled 2015-04-13: qty 15

## 2015-04-13 MED ORDER — MEPERIDINE HCL 100 MG/ML IJ SOLN
INTRAMUSCULAR | Status: DC | PRN
  Administered 2015-04-13 (×5): 25 mg via INTRAVENOUS

## 2015-04-13 MED ORDER — MIDAZOLAM HCL 5 MG/5ML IJ SOLN
INTRAMUSCULAR | Status: DC | PRN
  Administered 2015-04-13: 1 mg via INTRAVENOUS
  Administered 2015-04-13 (×2): 2 mg via INTRAVENOUS

## 2015-04-13 MED ORDER — STERILE WATER FOR IRRIGATION IR SOLN
Status: DC | PRN
  Administered 2015-04-13: 10:00:00

## 2015-04-13 MED ORDER — MIDAZOLAM HCL 5 MG/5ML IJ SOLN
INTRAMUSCULAR | Status: AC
  Filled 2015-04-13: qty 10

## 2015-04-13 MED ORDER — MEPERIDINE HCL 100 MG/ML IJ SOLN
INTRAMUSCULAR | Status: AC
  Filled 2015-04-13: qty 2

## 2015-04-13 MED ORDER — SODIUM CHLORIDE 0.9 % IV SOLN
INTRAVENOUS | Status: DC
  Administered 2015-04-13: 20 mL/h via INTRAVENOUS

## 2015-04-13 MED ORDER — HYDROCORTISONE ACE-PRAMOXINE 1-1 % RE CREA: TOPICAL_CREAM | RECTAL | Status: DC

## 2015-04-13 MED ORDER — LIDOCAINE VISCOUS 2 % MT SOLN
OROMUCOSAL | Status: DC | PRN
  Administered 2015-04-13: 4 mL via OROMUCOSAL

## 2015-04-13 NOTE — H&P (View-Only) (Signed)
Subjective:    Patient ID: [name], female    DOB: 1965-11-06, 50 y.o.   MRN: [id number] [name], MD  HPI MAY PASS STINKY GAS. BMs: EVERY OTHER DAY, NO STRAINING. MAY EAT SEAFOOD AND HAVE NAUSEA/VOMTIING. NAUSEA: RARE. VOMITING: 1 WEEK AGO. HEARTBURN: NONE. MAY HAVE TROUBLE SWALLOWING PILLS BUT NOT FOOD.  BEEN ON IRON PILLS ALL THE TIME. BLOOD COUNT TRENDING DOWN SINCE JUL 2015. LAST RECTAL BLEEDING 2 MOS AGO.  PT DENIES FEVER, CHILLS, HEMATOCHEZIA, HEMATEMESIS, melena, diarrhea, CHEST PAIN, SHORTNESS OF BREATH, CHANGE IN BOWEL IN HABITS, constipation, OR abdominal pain.  Past Medical History  Diagnosis Date  . Sarcoidosis ([place])   . Diabetes mellitus   . Hypertension   . Asthma   . Abnormal uterine bleeding (AUB) 11/26/2012  . BV (bacterial vaginosis) 11/26/2012  . Fatigue 12/24/2012  . Other and unspecified ovarian cyst 01/01/2013  . Fibroids 01/01/2013  . Hemorrhoid 01/01/2013  . Vaginal discharge 02/12/2013    +yeast  . Constipation   . GERD (gastroesophageal reflux disease)   . 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 IBS (irritable bowel syndrome)   . Vaginal itching 02/14/2015   Past Surgical History  Procedure Laterality Date  . Tubal ligation    . Endometrial ablation    . Cholecystectomy    . Hemorrhoid surgery      banding  . Colonoscopy N/A 08/20/2013    Dr. Oneida Alar: normal mucosa in terminal ileum, mild diverticulosis in ascending colon, moderate sized hemorrhoids s/p banding  . Hemorrhoid banding  08/20/2013    Procedure: HEMORRHOID BANDING;  Surgeon: Danie Binder, MD;  Location: [facility];  Service: Endoscopy;;  . Hemorrhoid surgery N/A 12/25/2013    Procedure: EXTENSIVE HEMORRHOIDECTOMY;  Surgeon: Jamesetta So, MD;  Location: [facility] OR[facility];  Service: General;  Laterality: N/A;   Allergies  Allergen Reactions  . Shellfish Allergy Anaphylaxis   Current Outpatient Prescriptions  Medication Sig  Dispense Refill  . Ascorbic Acid (VITAMIN C) 100 MG tablet Take 100 mg by mouth daily.    Marland Kitchen aspirin EC 325 MG tablet Take 325 mg by mouth daily.    Marland Kitchen atorvastatin (LIPITOR) 20 MG tablet Take 20 mg by mouth at bedtime.     . benazepril (LOTENSIN) 10 MG tablet Take 10 mg by mouth daily.     . fenofibrate (TRICOR) 145 MG tablet Take 145 mg by mouth at bedtime.     Marland Kitchen ibuprofen (ADVIL,MOTRIN) 800 MG tablet TAKE ONE TABLET BY MOUTH EVERY 8 HOURS AS NEEDED    . IRON PO Take by mouth 3 (three) times daily.    . metFORMIN (GLUCOPHAGE) 1000 MG tablet Take 1,000 mg by mouth 2 (two) times daily with a meal.     . ranitidine (ZANTAC) 150 MG tablet Take 300 mg by mouth 2 (two) times daily.     .      .       Review of Systems PER HPI OTHERWISE ALL SYSTEMS ARE NEGATIVE.    Objective:   Physical Exam  Constitutional: She is oriented to person, place, and time. She appears well-developed and well-nourished. No distress.  HENT:  Head: Normocephalic and atraumatic.  Mouth/Throat: Oropharynx is clear and moist. No oropharyngeal exudate.  Eyes: Pupils are equal, round, and reactive to light. No scleral icterus.  Neck: Normal range of motion. Neck supple.  Cardiovascular: Normal rate, regular rhythm and normal heart sounds.  Pulmonary/Chest: Effort normal and breath sounds normal. No respiratory distress.  Abdominal: Soft. Bowel sounds are normal. She exhibits no distension. There is no tenderness.  Musculoskeletal: She exhibits no edema.  Lymphadenopathy:    She has no cervical adenopathy.  Neurological: She is alert and oriented to person, place, and time.  NO FOCAL DEFICITS  Psychiatric: She has a normal mood and affect.  Vitals reviewed.     Assessment & Plan:

## 2015-04-13 NOTE — Op Note (Addendum)
[facility] [address] [place], [address]   FLEX SIGMOIDOSCOPY PROCEDURE REPORT  PATIENT: [name], [name]  MR#: [id number] BIRTHDATE: Aug 05, 1965 , 49  yrs. old GENDER: female ENDOSCOPIST: Danie Binder, MD REFERRED [id number] Hasanaj, M.D. PROCEDURE DATE:  05-01-15 PROCEDURE:   Sigmoidoscopy, screening and Hemorrhoidectomy via banding, clips or ligation INDICATIONS:hematochezia. MEDICATIONS:     EGD + NONE MD INITIATED/ORDERED SEDATION: 0958 LAST DOSE: 1117. END OF EGD/FLEX SIG: 1038  DESCRIPTION OF PROCEDURE:    Physical exam was performed.  Informed consent was obtained from the patient after explaining the benefits, risks, and alternatives to procedure.  The patient was connected to monitor and placed in left lateral position. Continuous oxygen was provided by nasal cannula and IV medicine administered through an indwelling cannula.  After administration of sedation and rectal exam, the patients rectum was intubated and the [id number]  colonoscope was advanced under direct visualization to the [place]. The scope was removed slowly by carefully examining the color, texture, anatomy, and integrity mucosa on the way out.  The patient was recovered in endoscopy and discharged home in satisfactory condition. Estimated blood loss is zero unless otherwise noted in this procedure report.    COLON FINDINGS: The colon mucosa was otherwise normal and Moderate sized internal hemorrhoids were found. 2 BANDS APPLIED.  PREP QUALITY: The overall prep quality was adequate COMPLICATIONS: None  ENDOSCOPIC IMPRESSION: 1.   RECTAL BLEEDING DUE TO  Moderate sized internal hemorrhoids  RECOMMENDATIONS: 1.  NO ASPIRIN FOR 3 MOS.  May use ibuprofen as needed for pain. 2 Use sitz bath 3 times a day AS NEEDED FOR RECTAL PAIN. 3.  Avoid straining to have bowel movements. 4.  Keep anal area dry and clean. 5.  Do not use a donut shaped pillow or sit on the toilet  for long periods.  This increases blood pooling and pain. 6.  Move your bowels when your body has the urge; this will require less straining and will decrease pain and pressure. 7.  IF NEEDED, Add Colace 100 mg twice daily FOR 7 DAYS to soften the stool.  HOLD FOR DIARRHEA. 8.  CONTINUE ZANTAC AND IRON. 9.  PROCTOCREAM FOUR TIMES A DAY PRN      _______________________________ Lorrin MaisDanie Binder, MD May 01, 2015 6:31 PM Revised: 05/01/2015 6:31 PM  CPT CODES: ICD CODES:  The ICD and CPT codes recommended by this software are interpretations from the data that the clinical staff has captured with the software.  The verification of the translation of this report to the ICD and CPT codes and modifiers is the sole responsibility of the health care institution and practicing physician where this report was generated.  [employer]. will not be held responsible for the validity of the ICD and CPT codes included on this report.  AMA assumes no liability for data contained or not contained herein. CPT is a Designer, television/film set of the [employer].

## 2015-04-13 NOTE — Interval H&P Note (Signed)
History and Physical Interval Note:  04/13/2015 9:49 AM  [name]  has presented today for surgery, with the diagnosis of anemia  The various methods of treatment have been discussed with the patient and family. After consideration of risks, benefits and other options for treatment, the patient has consented to  Procedure(s) with comments: ESOPHAGOGASTRODUODENOSCOPY (EGD) (N/A) - 0915 as a surgical intervention .  The patient's history has been reviewed, patient examined, no change in status, stable for surgery.  I have reviewed the patient's chart and labs.  Questions were answered to the patient's satisfaction.     [employer]

## 2015-04-13 NOTE — Discharge Instructions (Signed)
HEMORRHOIDAL BANDING DISCHARGE INSTRUCTIONS  YOU HAVE GASTRITIS DUE TO ASPIRIN USE AND A HIATAL HERNIA. I PUT 2 BANDS IN AROUND YOUR HEMORRHOIDS. I BIOPSIED YOUR STOMACH AND SMALL BOWEL. DRINK WATER TO KEEP YOUR URINE LIGHT YELLOW. AVOID CONSTIPATION.   HOME CARE INSTRUCTIONS-FOLLOWING YOUR PROCEDURE IT IS COMMON TO HAVE MINOR RECTAL DISCOMFORT OR PAIN.  1.  NO ASPIRIN FOR 3 MOS. May use ibuprofen  as needed for pain.   Take the ibuprofen with food and milk.  2   Use sitz bath 3 times a day AS NEEDED FOR RECTAL PAIN.  3. Avoid straining to have bowel movements. 4. Keep anal area dry and clean.  5. Do not use a donut shaped pillow or sit on the toilet for long periods. This increases blood pooling and pain.  6. Move your bowels when your body has the urge; this will require less straining and will decrease pain and pressure.  7. IF NEEDED, Add Colace 100 mg twice daily FOR 7 DAYS to soften the stool. HOLD FOR DIARRHEA. 8. CONTINUE ZANTAC AND IRON. 9. Outlook A DAY WHEN YOU HAVE RECTAL BLEEDING/PAIN     SIGMOIDOSCOPY Care After Read the instructions outlined below and refer to this sheet in the next week. These discharge instructions provide you with general information on caring for yourself after you leave the hospital. While your treatment has been planned according to the most current medical practices available, unavoidable complications occasionally occur. If you have any problems or questions after discharge, call DR. Georgann Bramble, [phone].  ACTIVITY  You may resume your regular activity, but move at a slower pace for the next 24 hours.   Take frequent rest periods for the next 24 hours.   Walking will help get rid of the air and reduce the bloated feeling in your belly (abdomen).   No driving for 24 hours (because of the medicine (anesthesia) used during the test).   You may shower.   Do not sign any important legal documents or operate any machinery for 24  hours (because of the anesthesia used during the test).    NUTRITION  Drink plenty of fluids.   You may resume your normal diet as instructed by your doctor.   Begin with a light meal and progress to your normal diet. Heavy or fried foods are harder to digest and may make you feel sick to your stomach (nauseated).   Avoid alcoholic beverages for 24 hours or as instructed.    MEDICATIONS  You may resume your normal medications.   WHAT YOU CAN EXPECT TODAY  Some feelings of bloating in the abdomen.   Passage of more gas than usual.   Spotting of blood in your stool or on the toilet paper  .  IF YOU HAD POLYPS REMOVED DURING THE COLONOSCOPY:  Eat a soft diet IF YOU HAVE NAUSEA, BLOATING, ABDOMINAL PAIN, OR VOMITING.    FINDING OUT THE RESULTS OF YOUR TEST Not all test results are available during your visit. DR. Oneida Alar WILL CALL YOU WITHIN 14 DAYS OF YOUR PROCEDUE WITH YOUR RESULTS. Do not assume everything is normal if you have not heard from DR. Crandall Harvel, CALL HER OFFICE AT [phone].  SEEK IMMEDIATE MEDICAL ATTENTION AND CALL THE OFFICE: [phone] IF:  You have more than a spotting of blood in your stool.   Your belly is swollen (abdominal distention).   You are nauseated or vomiting.   You have a temperature over 101F.   You have abdominal pain  or discomfort that is severe or gets worse throughout the day.   HEMORRHOIDAL BANDING COMPLICATIONS:  COMMON: 1. MINOR PAIN  UNCOMMON: 1. ABSCESS 2. BAND FALLS OFF 3. PROLAPSE OF HEMORRHOIDS AND PAIN 4. ULCER BLEEDING  A. USUALLY SELF-LIMITED: MAY LAST 3-5 DAYS  B. MAY REQUIRE INTERVENTION: 1-2 WEEKS AFTER INTERACTIONS 5. NECROTIZING PELVIC SEPSIS  A. SYMPTOMS: FEVER, PAIN, DIFFICULTY URINATING

## 2015-04-13 NOTE — Op Note (Addendum)
[facility] [address] [place], [address]   ENDOSCOPY PROCEDURE REPORT  PATIENT: [name], [name]  MR#: [id number] BIRTHDATE: 12-Aug-1965 , 49  yrs. old GENDER: female  ENDOSCOPIST: Danie Binder, MD REFERRED [id number] Hasanaj, M.D. PROCEDURE DATE: 05-10-2015 PROCEDURE:   EGD w/ biopsy  INDICATIONS:dyspepsia.[name]. TAKES IRON TID. TAKES ASA/IBUPROFEN. DEC 2016: FERRITIN 29, Hb 11.8, MCV 86 Cr 0.74, TCS MAY 2015: DIVERTICULOSIS, IH MEDICATIONS: Demerol 125 mg IV and Versed 5 mg IVMD INITIATED/ORDERED SEDATION: 0958 LAST DOSE: 1117. END OF EGD/FLEX SIG: [id number]  TOPICAL ANESTHETIC:   Viscous Xylocaine ASA CLASS:  DESCRIPTION OF PROCEDURE:     Physical exam was performed.  Informed consent was obtained from the patient after explaining the benefits, risks, and alternatives to the procedure.  The patient was connected to the monitor and placed in the left lateral position.  Continuous oxygen was provided by nasal cannula and IV medicine administered through an indwelling cannula.  After administration of sedation, the patients esophagus was intubated and the [id number]  endoscope was advanced under direct visualization to the second portion of the duodenum.  The scope was removed slowly by carefully examining the color, texture, anatomy, and integrity of the mucosa on the way out.  The patient was recovered in endoscopy and discharged home in satisfactory condition.  Estimated blood loss is zero unless otherwise noted in this procedure report.    ESOPHAGUS: The mucosa of the esophagus appeared normal.   STOMACH: Mild and moderate erosive gastritis (inflammation) was found in the gastric antrum and cardia.  Multiple biopsies were performed using cold forceps.   DUODENUM: The duodenal mucosa showed no abnormalities in the bulb and 2nd part of the duodenum.  Cold forceps biopsies were taken in the bulb and second portion. COMPLICATIONS: There were no  immediate complications.  ENDOSCOPIC IMPRESSION: 1.   FEDA DUE TO EROSIVE GASTRITIS/RECTAL BLEEDING  RECOMMENDATIONS: 1.  NO ASPIRIN FOR 3 MOS.  May use ibuprofen as needed for pain. 2  Use sitz bath 3 times a day AS NEEDED FOR RECTAL PAIN. 3.  Avoid straining to have bowel movements. 4.  Keep anal area dry and clean. 5.  Do not use a donut shaped pillow or sit on the toilet for long periods.  This increases blood pooling and pain. 6.  Move your bowels when your body has the urge; this will require less straining and will decrease pain and pressure. 7.  IF NEEDED, Add Colace 100 mg twice daily FOR 7 DAYS to soften the stool.  HOLD FOR DIARRHEA. 8.  CONTINUE ZANTAC AND IRON. 9.  PROCTOCREAM FOUR TIMES A DAY PRN  REPEAT EXAM: eSigned:  Danie Binder, MD 05/10/2015 6:30 PMRevised: 05/10/2015 6:30 PMD  CPT CODES: ICD CODES:  The ICD and CPT codes recommended by this software are interpretations from the data that the clinical staff has captured with the software.  The verification of the translation of this report to the ICD and CPT codes and modifiers is the sole responsibility of the health care institution and practicing physician where this report was generated.  [facility]. will not be held responsible for the validity of the ICD and CPT codes included on this report.  AMA assumes no liability for data contained or not contained herein. CPT is a Designer, television/film set of the [employer].

## 2015-04-26 ENCOUNTER — Encounter (HOSPITAL_COMMUNITY): Payer: Self-pay | Admitting: Gastroenterology

## 2015-05-02 ENCOUNTER — Telehealth: Payer: Self-pay | Admitting: Gastroenterology

## 2015-05-02 ENCOUNTER — Encounter (HOSPITAL_COMMUNITY): Payer: Self-pay | Admitting: Gastroenterology

## 2015-05-02 NOTE — Telephone Encounter (Addendum)
PLEASE CALL PT. Her stomach Bx showed H. Pylori infection. THIS CAN CAUSE LOW BLOOD COUNT, & NAUSEA/VOMITING. She needs AMOXICILLIN 500 mg 2 po BID for 10 days and Biaxin 500 mg po bid for 10 days. TAKE Omeprazole 20 mg BID for 10 days then 1 po dAILY forEVER. DO NOT TAKE LIPITOR WHILE TAKING THE ANTIBIOTICS. Med side effects include NVD, abd pain, and metallic taste. OPV APR 2017. PT NEEDS CBC/FERRITIN 1 WEEK PRIOR TO HER NEXT VISIT.

## 2015-05-04 MED ORDER — AMOXICILLIN 500 MG PO TABS: ORAL_TABLET | ORAL | Status: DC

## 2015-05-04 MED ORDER — CLARITHROMYCIN 500 MG PO TABS: ORAL_TABLET | ORAL | Status: DC

## 2015-05-04 MED ORDER — OMEPRAZOLE 20 MG PO CPDR: DELAYED_RELEASE_CAPSULE | ORAL | Status: DC

## 2015-05-05 NOTE — Telephone Encounter (Signed)
LMOM to call.

## 2015-05-05 NOTE — Telephone Encounter (Signed)
Reminder in epic °

## 2015-05-09 ENCOUNTER — Telehealth: Payer: Self-pay | Admitting: Gastroenterology

## 2015-05-09 ENCOUNTER — Other Ambulatory Visit: Payer: Self-pay

## 2015-05-09 DIAGNOSIS — D649 Anemia, unspecified: Secondary | ICD-10-CM

## 2015-05-09 NOTE — Telephone Encounter (Signed)
See result note. Pt is aware of results and recommendations.

## 2015-05-09 NOTE — Telephone Encounter (Signed)
Pt called. She is now aware of the results and the recommendations. She picked up the medication on Friday and started. She is now aware to [place] while taking the meds. Lab orders on file for 07/2015.

## 2015-05-09 NOTE — Telephone Encounter (Signed)
Pt called asking to speak with DS or go to her VM. Call was transferred to VM. Pt was aware that nurse was on another line

## 2015-05-10 NOTE — Telephone Encounter (Signed)
REVIEWED-NO ADDITIONAL RECOMMENDATIONS. 

## 2015-05-17 ENCOUNTER — Telehealth: Payer: Self-pay | Admitting: Adult Health

## 2015-05-17 MED ORDER — FLUCONAZOLE 150 MG PO TABS: ORAL_TABLET | ORAL | Status: DC

## 2015-05-17 NOTE — Telephone Encounter (Signed)
Will rx diflucan  

## 2015-05-17 NOTE — Telephone Encounter (Signed)
Spoke with pt. Pt was prescribed Biaxin and Amoxicillin from Dr. Oneida Alar. She now has a yeast infection. She is requesting something to help with this. I spoke with JAG and she advised to try Monistat. Pt states she don't like Monistat and wants something called in. Thanks!! [place]

## 2015-05-18 DIAGNOSIS — J40 Bronchitis, not specified as acute or chronic: Secondary | ICD-10-CM | POA: Diagnosis not present

## 2015-05-23 DIAGNOSIS — J40 Bronchitis, not specified as acute or chronic: Secondary | ICD-10-CM | POA: Diagnosis not present

## 2015-07-01 ENCOUNTER — Other Ambulatory Visit: Payer: Self-pay

## 2015-07-01 DIAGNOSIS — D649 Anemia, unspecified: Secondary | ICD-10-CM

## 2015-07-13 DIAGNOSIS — D649 Anemia, unspecified: Secondary | ICD-10-CM | POA: Diagnosis not present

## 2015-07-14 ENCOUNTER — Encounter: Payer: Self-pay | Admitting: Gastroenterology

## 2015-07-14 ENCOUNTER — Ambulatory Visit (INDEPENDENT_AMBULATORY_CARE_PROVIDER_SITE_OTHER): Payer: Medicare Other | Admitting: Gastroenterology

## 2015-07-14 VITALS — BP 128/81 | HR 106 | Temp 97.3°F | Ht 66.0 in | Wt 185.6 lb

## 2015-07-14 DIAGNOSIS — R945 Abnormal results of liver function studies: Secondary | ICD-10-CM

## 2015-07-14 DIAGNOSIS — K219 Gastro-esophageal reflux disease without esophagitis: Secondary | ICD-10-CM | POA: Diagnosis not present

## 2015-07-14 DIAGNOSIS — B9681 Helicobacter pylori [H. pylori] as the cause of diseases classified elsewhere: Secondary | ICD-10-CM | POA: Diagnosis not present

## 2015-07-14 DIAGNOSIS — K5901 Slow transit constipation: Secondary | ICD-10-CM

## 2015-07-14 DIAGNOSIS — K2970 Gastritis, unspecified, without bleeding: Secondary | ICD-10-CM

## 2015-07-14 DIAGNOSIS — D649 Anemia, unspecified: Secondary | ICD-10-CM

## 2015-07-14 DIAGNOSIS — R7989 Other specified abnormal findings of blood chemistry: Secondary | ICD-10-CM

## 2015-07-14 DIAGNOSIS — K648 Other hemorrhoids: Secondary | ICD-10-CM

## 2015-07-14 LAB — CBC WITH DIFFERENTIAL/PLATELET
Basophils Absolute: 0 cells/uL (ref 0–200)
Basophils Relative: 0 %
EOS PCT: 1 %
Eosinophils Absolute: 86 cells/uL (ref 15–500)
HCT: 34.8 % — ABNORMAL LOW (ref 35.0–45.0)
Hemoglobin: 11.4 g/dL — ABNORMAL LOW (ref 11.7–15.5)
LYMPHS ABS: 2924 {cells}/uL (ref 850–3900)
Lymphocytes Relative: 34 %
MCH: 28.6 pg (ref 27.0–33.0)
MCHC: 32.8 g/dL (ref 32.0–36.0)
MCV: 87.4 fL (ref 80.0–100.0)
MONOS PCT: 6 %
MPV: 11.5 fL (ref 7.5–12.5)
Monocytes Absolute: 516 cells/uL (ref 200–950)
NEUTROS ABS: 5074 {cells}/uL (ref 1500–7800)
NEUTROS PCT: 59 %
PLATELETS: 285 10*3/uL (ref 140–400)
RBC: 3.98 MIL/uL (ref 3.80–5.10)
RDW: 13.5 % (ref 11.0–15.0)
WBC: 8.6 10*3/uL (ref 3.8–10.8)

## 2015-07-14 LAB — FERRITIN: FERRITIN: 28 ng/mL (ref 10–232)

## 2015-07-14 MED ORDER — LINACLOTIDE 290 MCG PO CAPS: ORAL_CAPSULE | ORAL | Status: DC

## 2015-07-14 NOTE — Assessment & Plan Note (Addendum)
SYMPTOMS NOT IDEALLY CONTROLLED.  DRINK WATER TO KEEP YOUR URINE LIGHT YELLOW. FOLLOW A HIGH FIBER DIET. AVOID ITEMS THAT CAUSE BLOATING & GAS. To better MANAGE YOUR CONSTIPATION, OPEN LINZESS CAPSULE. PLACE GRANULES IN 4 TEASPOONS OF WATER. STIR IT FOR 30 SECONDS. TAKE 3 TSP OF THE WATER DAILY YOU DO NOT NEED TO TAKE THE GRANULES THE MEDICINE IS IN THE WATER. IT MAY CAUSE EXPLOSIVE DIARRHEA. FOLLOW UP IN 6 MOS.   Marland Kitchen

## 2015-07-14 NOTE — Assessment & Plan Note (Signed)
SYMPTOMS CONTROLLED/RESOLVED.  CONTINUE OMEPRAZOLE.  TAKE 30 MINUTES PRIOR TO YOUR FIRST MEAL. ENCOURAGED WEIGHT LOSS FOLLOW UP IN 6 MOS.

## 2015-07-14 NOTE — Assessment & Plan Note (Signed)
FERRITIN/HB NORMAL OFF PO IRON. NO WARNING SIGNS/SYMPTOMS.  NO INDICATION FOR CAPSULE ENDOSCOPY AT THIS TIME. RECHECK CBC/FERRITIN AFTER NEXT VISIT.

## 2015-07-14 NOTE — Progress Notes (Signed)
CC'ED TO PCP 

## 2015-07-14 NOTE — Progress Notes (Signed)
Subjective:    Patient ID: [name], female    DOB: 16-Apr-1965, 50 y.o.   MRN: [id number]  [name], MD  HPI HAS NO CONTROLS OF BOWEL SINCE HAVING GALLBLADDER REMOVED. WAS HAVING ACCIDENTS. STOMACH STILL NOT RIGHT. PERIODICALLY TAKING LINZESS FOR CONTIPATION. DOESN'T TAKE EVERY DAY. HAS A LOT OF STRESS INHER LIFE.   PT DENIES FEVER, CHILLS, HEMATOCHEZIA, nausea, vomiting, melena, CHEST PAIN, SHORTNESS OF BREATH,  CHANGE IN BOWEL IN HABITS,  abdominal pain, problems swallowing, OR heartburn or indigestion.    Past Medical History  Diagnosis Date  . Sarcoidosis ([place])   . Diabetes mellitus   . Hypertension   . Asthma   . Abnormal uterine bleeding (AUB) 11/26/2012  . BV (bacterial vaginosis) 11/26/2012  . Fatigue 12/24/2012  . Other and unspecified ovarian cyst 01/01/2013  . Fibroids 01/01/2013  . Hemorrhoid 01/01/2013  . Vaginal discharge 02/12/2013    +yeast  . Constipation   . GERD (gastroesophageal reflux disease)   . Neuropathy due to secondary diabetes ([place])   . 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 IBS (irritable bowel syndrome)   . Vaginal itching 02/14/2015  . Hemorrhoids 04/05/2015  . Hematuria 04/05/2015  . Vaginal odor 04/05/2015  . Helicobacter pylori gastritis JAN 2017    EGD Bx   Past Surgical History  Procedure Laterality Date  . Tubal ligation    . Endometrial ablation    . Cholecystectomy    . Hemorrhoid surgery      banding  . Colonoscopy N/A 08/20/2013    Dr. Oneida Alar: normal mucosa in terminal ileum, mild diverticulosis in ascending colon, moderate sized hemorrhoids s/p banding  . Hemorrhoid banding  08/20/2013    Procedure: HEMORRHOID BANDING;  Surgeon: Danie Binder, MD;  [facility]: [facility];  Service: Endoscopy;;  . Hemorrhoid surgery N/A 12/25/2013    Procedure: EXTENSIVE HEMORRHOIDECTOMY;  Surgeon: Jamesetta So, MD;  Location: [facility] ORS;  Service: General;  Laterality: N/A;  . Esophagogastroduodenoscopy N/A 04/13/2015    SLF: 1. FEDA  due ot erosive gastriitis/ rectal bleeding   . Flexible sigmoidoscopy N/A 04/13/2015    SLF: 1. Rectal bleeding due to moderate sized internal hemorrhoids  . Hemorrhoid banding  04/13/2015    Procedure: HEMORRHOID BANDING;  Surgeon: Danie Binder, MD;  Location: [facility];  Service: Endoscopy;;   Allergies  Allergen Reactions  . Shellfish Allergy Anaphylaxis  . Other Itching    pinapple   Current Outpatient Prescriptions  Medication Sig Dispense Refill  . VITAMIN C 100 MG tablet Take 100 mg by mouth daily.    Marland Kitchen atorvastatin (LIPITOR) 20 MG tablet Take 20 mg by mouth at bedtime.     . benazepril (LOTENSIN) 10 MG tablet Take 10 mg by mouth daily.     .      . fenofibrate (TRICOR) 145 MG tablet Take 145 mg by mouth at bedtime.     Marland Kitchen ibuprofen  800 MG tablet TAKE ONE TABLET BY MOUTH EVERY 8 HOURS AS NEEDED    .      . metFORMIN  1000 MG tablet Take 1,000 mg by mouth 2 (two) times daily with a meal.     . MYCOLOG II) cream Apply 1 application topically 2 (two) times daily. (Patient taking differently: Apply 1 application topically as needed. )    . omeprazole 20 MG capsule 1 po once daily    . ranitidine (ZANTAC) 150 MG tablet Take 300 mg by mouth 2 (two)  times daily PRN    .      .      .       Review of Systems PER HPI OTHERWISE ALL SYSTEMS ARE NEGATIVE.    Objective:   Physical Exam  Constitutional: She is oriented to person, place, and time. She appears well-developed and well-nourished. No distress.  HENT:  Head: Normocephalic and atraumatic.  Mouth/Throat: Oropharynx is clear and moist. No oropharyngeal exudate.  Eyes: Pupils are equal, round, and reactive to light. No scleral icterus.  Neck: Normal range of motion. Neck supple.  Cardiovascular: Normal rate, regular rhythm and normal heart sounds.   Pulmonary/Chest: Effort normal and breath sounds normal. No respiratory distress.  Abdominal: Soft. Bowel sounds are normal. She exhibits no distension. There is no  tenderness.  Musculoskeletal: She exhibits no edema.  Lymphadenopathy:    She has no cervical adenopathy.  Neurological: She is alert and oriented to person, place, and time.  NO FOCAL DEFICITS  Psychiatric:  FLAT AFFECT, NL MOOD  Vitals reviewed.     Assessment & Plan:

## 2015-07-14 NOTE — Patient Instructions (Addendum)
DRINK WATER TO KEEP YOUR URINE LIGHT YELLOW.  FOLLOW A HIGH FIBER DIET. AVOID ITEMS THAT CAUSE BLOATING & GAS.  To better MANAGE YOUR CONSTIPATION, OPEN LINZESS CAPSULE. PLACE GRANULES IN 4 TEASPOONS OF WATER. STIR IT FOR 30 SECONDS. TAKE 3 TSP OF THE WATER DAILY YOU DO NOT NEED TO TAKE THE GRANULES THE MEDICINE IS IN THE WATER. IT MAY CAUSE EXPLOSIVE DIARRHEA.  CONTINUE OMEPRAZOLE.  TAKE 30 MINUTES PRIOR TO YOUR FIRST MEAL.  PLEASE CALL WITH QUESTIONS OR CONCERNS: RECTAL BLEEDING OR BLACK TARRY STOOLS  FOLLOW UP IN 6 MOS. WE WILL RECHECK YOUR LIVER PANEL AND BLOOD COUNT/IRON STORES AT THAT TIME.

## 2015-07-14 NOTE — Progress Notes (Signed)
ON RECALL  °

## 2015-07-14 NOTE — Assessment & Plan Note (Signed)
SYMPTOMS CONTROLLED/RESOLVED.  CONTINUE TO MONITOR SYMPTOMS. 

## 2015-07-14 NOTE — Assessment & Plan Note (Signed)
WEIGHT INCREASING-NL HFP DEC 2016.  CONTINUE YOUR WEIGHT LOSS EFFORTS. LOW FAT DIET FOLLOW UP IN 6 MOS. NEEDS HFP AFTER NEXT VISIT

## 2015-07-14 NOTE — Assessment & Plan Note (Addendum)
SYMPTOMS CONTROLLED/RESOLVED. COMPLETED THERAPY. NO SIDE EFFECTS.  REDUCE OMEPRAZOLE TO ONCE DAILY. ANSWERED QUESTIONS ABOUT H PYLORI INFECTION. FOLLOW UP IN 6 MOS.

## 2015-07-20 DIAGNOSIS — J449 Chronic obstructive pulmonary disease, unspecified: Secondary | ICD-10-CM | POA: Diagnosis not present

## 2015-07-20 DIAGNOSIS — D869 Sarcoidosis, unspecified: Secondary | ICD-10-CM | POA: Diagnosis not present

## 2015-07-30 DIAGNOSIS — Z7984 Long term (current) use of oral hypoglycemic drugs: Secondary | ICD-10-CM | POA: Diagnosis not present

## 2015-07-30 DIAGNOSIS — Z79899 Other long term (current) drug therapy: Secondary | ICD-10-CM | POA: Diagnosis not present

## 2015-07-30 DIAGNOSIS — J45909 Unspecified asthma, uncomplicated: Secondary | ICD-10-CM | POA: Diagnosis not present

## 2015-07-30 DIAGNOSIS — R110 Nausea: Secondary | ICD-10-CM | POA: Diagnosis not present

## 2015-07-30 DIAGNOSIS — R197 Diarrhea, unspecified: Secondary | ICD-10-CM | POA: Diagnosis not present

## 2015-07-30 DIAGNOSIS — I10 Essential (primary) hypertension: Secondary | ICD-10-CM | POA: Diagnosis not present

## 2015-07-30 DIAGNOSIS — E119 Type 2 diabetes mellitus without complications: Secondary | ICD-10-CM | POA: Diagnosis not present

## 2015-07-30 DIAGNOSIS — Z7982 Long term (current) use of aspirin: Secondary | ICD-10-CM | POA: Diagnosis not present

## 2015-07-30 DIAGNOSIS — E7800 Pure hypercholesterolemia, unspecified: Secondary | ICD-10-CM | POA: Diagnosis not present

## 2015-08-08 DIAGNOSIS — E119 Type 2 diabetes mellitus without complications: Secondary | ICD-10-CM | POA: Diagnosis not present

## 2015-08-08 DIAGNOSIS — Z7984 Long term (current) use of oral hypoglycemic drugs: Secondary | ICD-10-CM | POA: Diagnosis not present

## 2015-08-08 DIAGNOSIS — E785 Hyperlipidemia, unspecified: Secondary | ICD-10-CM | POA: Diagnosis not present

## 2015-08-08 DIAGNOSIS — Z9119 Patient's noncompliance with other medical treatment and regimen: Secondary | ICD-10-CM | POA: Diagnosis not present

## 2015-08-08 DIAGNOSIS — H04123 Dry eye syndrome of bilateral lacrimal glands: Secondary | ICD-10-CM | POA: Diagnosis not present

## 2015-08-08 DIAGNOSIS — D8689 Sarcoidosis of other sites: Secondary | ICD-10-CM | POA: Diagnosis not present

## 2015-08-08 DIAGNOSIS — H02206 Unspecified lagophthalmos left eye, unspecified eyelid: Secondary | ICD-10-CM | POA: Diagnosis not present

## 2015-08-08 DIAGNOSIS — H02203 Unspecified lagophthalmos right eye, unspecified eyelid: Secondary | ICD-10-CM | POA: Diagnosis not present

## 2015-08-08 DIAGNOSIS — Z79899 Other long term (current) drug therapy: Secondary | ICD-10-CM | POA: Diagnosis not present

## 2015-08-08 DIAGNOSIS — I10 Essential (primary) hypertension: Secondary | ICD-10-CM | POA: Diagnosis not present

## 2015-08-08 DIAGNOSIS — Z7982 Long term (current) use of aspirin: Secondary | ICD-10-CM | POA: Diagnosis not present

## 2015-08-08 DIAGNOSIS — Z87891 Personal history of nicotine dependence: Secondary | ICD-10-CM | POA: Diagnosis not present

## 2015-08-22 DIAGNOSIS — K210 Gastro-esophageal reflux disease with esophagitis: Secondary | ICD-10-CM | POA: Diagnosis not present

## 2015-08-22 DIAGNOSIS — E784 Other hyperlipidemia: Secondary | ICD-10-CM | POA: Diagnosis not present

## 2015-08-22 DIAGNOSIS — E1165 Type 2 diabetes mellitus with hyperglycemia: Secondary | ICD-10-CM | POA: Diagnosis not present

## 2015-08-31 DIAGNOSIS — J432 Centrilobular emphysema: Secondary | ICD-10-CM | POA: Diagnosis not present

## 2015-08-31 DIAGNOSIS — J301 Allergic rhinitis due to pollen: Secondary | ICD-10-CM | POA: Diagnosis not present

## 2015-08-31 DIAGNOSIS — Z862 Personal history of diseases of the blood and blood-forming organs and certain disorders involving the immune mechanism: Secondary | ICD-10-CM | POA: Diagnosis not present

## 2015-09-15 ENCOUNTER — Ambulatory Visit (INDEPENDENT_AMBULATORY_CARE_PROVIDER_SITE_OTHER): Payer: Medicare Other | Admitting: Gastroenterology

## 2015-09-15 ENCOUNTER — Encounter: Payer: Self-pay | Admitting: Gastroenterology

## 2015-09-15 ENCOUNTER — Other Ambulatory Visit: Payer: Self-pay

## 2015-09-15 VITALS — BP 108/67 | HR 69 | Temp 97.6°F | Ht 66.0 in | Wt 185.6 lb

## 2015-09-15 DIAGNOSIS — K5900 Constipation, unspecified: Secondary | ICD-10-CM

## 2015-09-15 DIAGNOSIS — K648 Other hemorrhoids: Secondary | ICD-10-CM | POA: Diagnosis not present

## 2015-09-15 DIAGNOSIS — K5901 Slow transit constipation: Secondary | ICD-10-CM | POA: Diagnosis not present

## 2015-09-15 NOTE — Progress Notes (Signed)
Subjective:    Patient ID: [name], female    DOB: 09/28/1965, 50 y.o.   MRN: [id number]  Erica Burly, MD  HPI Having trouble MOVING BOWELS. LAST BM: SML-ONCE A WEEK. NOT COMING OUT LIKE IT SHOULD. LEFT SIDE/FLANK(SHARP) AND PRESSURE IN FRONT AND ON THE SIDES. SIDE IS HURTING.PAIN DOESN'T GET BETTER WITH BM.  BELLY BUTTON HURTING. BEEN GOING ON 1 WEEK AGO. NO NEW MEDS OR CHANGE IN MEDS. CAN'T EAT BECAUSE SHE CAN'T HAVE A BM. HAS PILLS FOR FOR NAUSEA. LINZESS 290 NOT WORKING. NO RECTAL PRESSURE, PAIN, ITCHING, BURNING, SOILING. NO HEMATURIA OR DYSURIA.   PT DENIES FEVER, CHILLS, HEMATOCHEZIA, nausea, vomiting, melena, diarrhea, CHEST PAIN, SHORTNESS OF BREATH, CHANGE IN BOWEL IN HABITS, problems swallowing, problems with sedation, OR heartburn or indigestion.  Past Medical History  Diagnosis Date  . Sarcoidosis ([place])   . Diabetes mellitus   . Hypertension   . Asthma   . Abnormal uterine bleeding (AUB) 11/26/2012  . BV (bacterial vaginosis) 11/26/2012  . Fatigue 12/24/2012  . Other and unspecified ovarian cyst 01/01/2013  . Fibroids 01/01/2013  . Hemorrhoid 01/01/2013  . Vaginal discharge 02/12/2013    +yeast  . Constipation   . GERD (gastroesophageal reflux disease)   . 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 IBS (irritable bowel syndrome)   . Vaginal itching 02/14/2015  . Hemorrhoids 04/05/2015  . Hematuria 04/05/2015  . Vaginal odor 04/05/2015  . Helicobacter pylori gastritis JAN 2017    EGD Bx   Past Surgical History  Procedure Laterality Date  . Tubal ligation    . Endometrial ablation    . Cholecystectomy    . Hemorrhoid surgery      banding  . Colonoscopy N/A 08/20/2013    Dr. Oneida Alar: normal mucosa in terminal ileum, mild diverticulosis in ascending colon, moderate sized hemorrhoids s/p banding  . Hemorrhoid banding  08/20/2013    Procedure: HEMORRHOID BANDING;  Surgeon: Danie Binder, MD;  [facility];   Service: Endoscopy;;  . Hemorrhoid surgery N/A 12/25/2013    Procedure: EXTENSIVE HEMORRHOIDECTOMY;  Surgeon: Jamesetta So, MD;  Location: [facility];  Service: General;  Laterality: N/A;  . Esophagogastroduodenoscopy N/A 04/13/2015    SLF: 1. FEDA due ot erosive gastriitis/ rectal bleeding   . Flexible sigmoidoscopy N/A 04/13/2015    SLF: 1. Rectal bleeding due to moderate sized internal hemorrhoids  . Hemorrhoid banding  04/13/2015    Procedure: HEMORRHOID BANDING;  Surgeon: Danie Binder, MD;  [facility];  Service: Endoscopy;;    Allergies  Allergen Reactions  . Shellfish Allergy Anaphylaxis  . Other Itching    pinapple    Current Outpatient Prescriptions  Medication Sig Dispense Refill  . Ascorbic Acid (VITAMIN C) 100 MG tablet Take 100 mg by mouth daily.    Marland Kitchen atorvastatin (LIPITOR) 20 MG tablet Take 20 mg by mouth at bedtime.     . benazepril (LOTENSIN) 10 MG tablet Take 10 mg by mouth daily.     . clarithromycin (BIAXIN) 500 MG tablet 1 PO BID FOR 10 DAYS.    . fenofibrate (TRICOR) 145 MG tablet Take 145 mg by mouth at bedtime.     Marland Kitchen ibuprofen (ADVIL,MOTRIN) 800 MG tablet TAKE ONE TABLET BY MOUTH EVERY 8 HOURS AS NEEDED    . metFORMIN (GLUCOPHAGE) 1000 MG tablet Take 1,000 mg by mouth 2 (two) times daily with a meal.     .  nystatin-triamcinolone (MYCOLOG II) cream Apply 1 application topically 2 (two) times daily. (Patient taking differently: Apply 1 application topically as needed. )    . omeprazole (PRILOSEC) 20 MG capsule 1 po bid for 3 mos then once daily    . ranitidine (ZANTAC) 150 MG tablet Take 300 mg by mouth 2 (two) times daily.     .       Review of Systems PER HPI OTHERWISE ALL SYSTEMS ARE NEGATIVE.    Objective:   Physical Exam  Constitutional: She is oriented to person, place, and time. She appears well-developed and well-nourished. No distress.  HENT:  Head: Normocephalic and atraumatic.  Mouth/Throat: Oropharynx is clear and moist. No  oropharyngeal exudate.  Eyes: Pupils are equal, round, and reactive to light. No scleral icterus.  Neck: Normal range of motion. Neck supple.  Cardiovascular: Normal rate, regular rhythm and normal heart sounds.   Pulmonary/Chest: Effort normal and breath sounds normal. No respiratory distress.  Abdominal: Soft. Bowel sounds are normal. She exhibits no distension. There is tenderness. There is no rebound and no guarding.  MILD TTP x4  Musculoskeletal: She exhibits no edema.  Lymphadenopathy:    She has no cervical adenopathy.  Neurological: She is alert and oriented to person, place, and time.  NO FOCAL DEFICITS  Psychiatric:  FLAT AFFECT, NL MOOD  Vitals reviewed.         Assessment & Plan:

## 2015-09-15 NOTE — Progress Notes (Signed)
ON RECALL  °

## 2015-09-15 NOTE — Patient Instructions (Addendum)
TAKE MIRALAX 17 GMS EVERY HOUR IN 8 OZ OF LIQUID FROM 12N TO 6PM. DRINK 1 CUP OF LIQUID 30 MINUTES AFTER EACH DOSE: 1230P TO 630PM.  TAKE DULCOLAX 10 MG AT 1 PM AND 4 PM.  ADD TRULANCE TODAY. HOLD ON MON AN DOSE SPARINGLY NEXT WEEK.  START TAKE IBUPROFEN 400 MG Q6H FOR 3 DAYS THEN 1 PO TID FOR 7 DAYS. TAKE WITH FOOD OR MILK  STOP LINZESS.  SMART PILL NEXT MON.  FOLLOW UP IN 2 MOS.

## 2015-09-15 NOTE — Assessment & Plan Note (Addendum)
SYMPTOMS NOT CONTROLLED ONLINZESS 290 MCG.  TAKE MIRALAX 17 GMS EVERY HOUR IN 8 OZ OF LIQUID FROM 12N TO 6PM. DRINK 1 CUP OF LIQUID 30 MINUTES AFTER EACH DOSE: 1230P TO 630PM. TAKE DULCOLAX 10 MG AT 1 PM AND 4 PM. ADD TRULANCE TODAY. HOLD ON MON AN DOSE SPARINGLY NEXT WEEK. IBUPROFEN FOR PAIN ATC. STOP LINZESS. SMART PILL NEXT MON. DISCUSSED PROCEDURE, BENEFITS, AND RISKS OF PROCEDURE.  FOLLOW UP IN 2 MOS.

## 2015-09-15 NOTE — Assessment & Plan Note (Signed)
SYMPTOMS CONTROLLED/RESOLVED.  CONTINUE TO MONITOR SYMPTOMS. 

## 2015-09-15 NOTE — Progress Notes (Signed)
cc'ed to pcp °

## 2015-09-16 DIAGNOSIS — E784 Other hyperlipidemia: Secondary | ICD-10-CM | POA: Diagnosis not present

## 2015-09-16 DIAGNOSIS — E1165 Type 2 diabetes mellitus with hyperglycemia: Secondary | ICD-10-CM | POA: Diagnosis not present

## 2015-09-16 DIAGNOSIS — I10 Essential (primary) hypertension: Secondary | ICD-10-CM | POA: Diagnosis not present

## 2015-09-19 ENCOUNTER — Ambulatory Visit (HOSPITAL_COMMUNITY)
Admission: RE | Admit: 2015-09-19 | Discharge: 2015-09-19 | Disposition: A | Discharge status: Home / Self Care | Payer: Medicare Other | Source: Ambulatory Visit | Attending: Gastroenterology | Admitting: Gastroenterology

## 2015-09-19 ENCOUNTER — Encounter (HOSPITAL_COMMUNITY)
Admission: RE | Disposition: A | Discharge status: Home / Self Care | Payer: Self-pay | Source: Ambulatory Visit | Attending: Gastroenterology

## 2015-09-19 DIAGNOSIS — K5900 Constipation, unspecified: Secondary | ICD-10-CM | POA: Insufficient documentation

## 2015-09-19 HISTORY — PX: SMART PILL PROCEDURE: SHX6496

## 2015-09-19 SURGERY — CAPSULE ENDOSCOPY, USING SMARTPILL MOTILITY TESTING SYSTEM

## 2015-09-21 ENCOUNTER — Encounter (HOSPITAL_COMMUNITY): Payer: Self-pay | Admitting: Gastroenterology

## 2015-10-01 DIAGNOSIS — Z7984 Long term (current) use of oral hypoglycemic drugs: Secondary | ICD-10-CM | POA: Diagnosis not present

## 2015-10-01 DIAGNOSIS — E7800 Pure hypercholesterolemia, unspecified: Secondary | ICD-10-CM | POA: Diagnosis not present

## 2015-10-01 DIAGNOSIS — Z8249 Family history of ischemic heart disease and other diseases of the circulatory system: Secondary | ICD-10-CM | POA: Diagnosis not present

## 2015-10-01 DIAGNOSIS — E119 Type 2 diabetes mellitus without complications: Secondary | ICD-10-CM | POA: Diagnosis not present

## 2015-10-01 DIAGNOSIS — J4531 Mild persistent asthma with (acute) exacerbation: Secondary | ICD-10-CM | POA: Diagnosis not present

## 2015-10-01 DIAGNOSIS — Z79899 Other long term (current) drug therapy: Secondary | ICD-10-CM | POA: Diagnosis not present

## 2015-10-01 DIAGNOSIS — J189 Pneumonia, unspecified organism: Secondary | ICD-10-CM | POA: Diagnosis not present

## 2015-10-01 DIAGNOSIS — I10 Essential (primary) hypertension: Secondary | ICD-10-CM | POA: Diagnosis not present

## 2015-10-01 DIAGNOSIS — R0602 Shortness of breath: Secondary | ICD-10-CM | POA: Diagnosis not present

## 2015-10-02 DIAGNOSIS — R0602 Shortness of breath: Secondary | ICD-10-CM | POA: Diagnosis not present

## 2015-10-02 IMAGING — CR DG CHEST 2V
2 series · 2 of 2 positions shown · non-contrast
Comparison: June 21, 2013

CLINICAL DATA: History of sarcoidosis ; preoperative
hemorrhoidectomy

EXAM:
CHEST  2 VIEW

[view not recorded (1 of 2)]
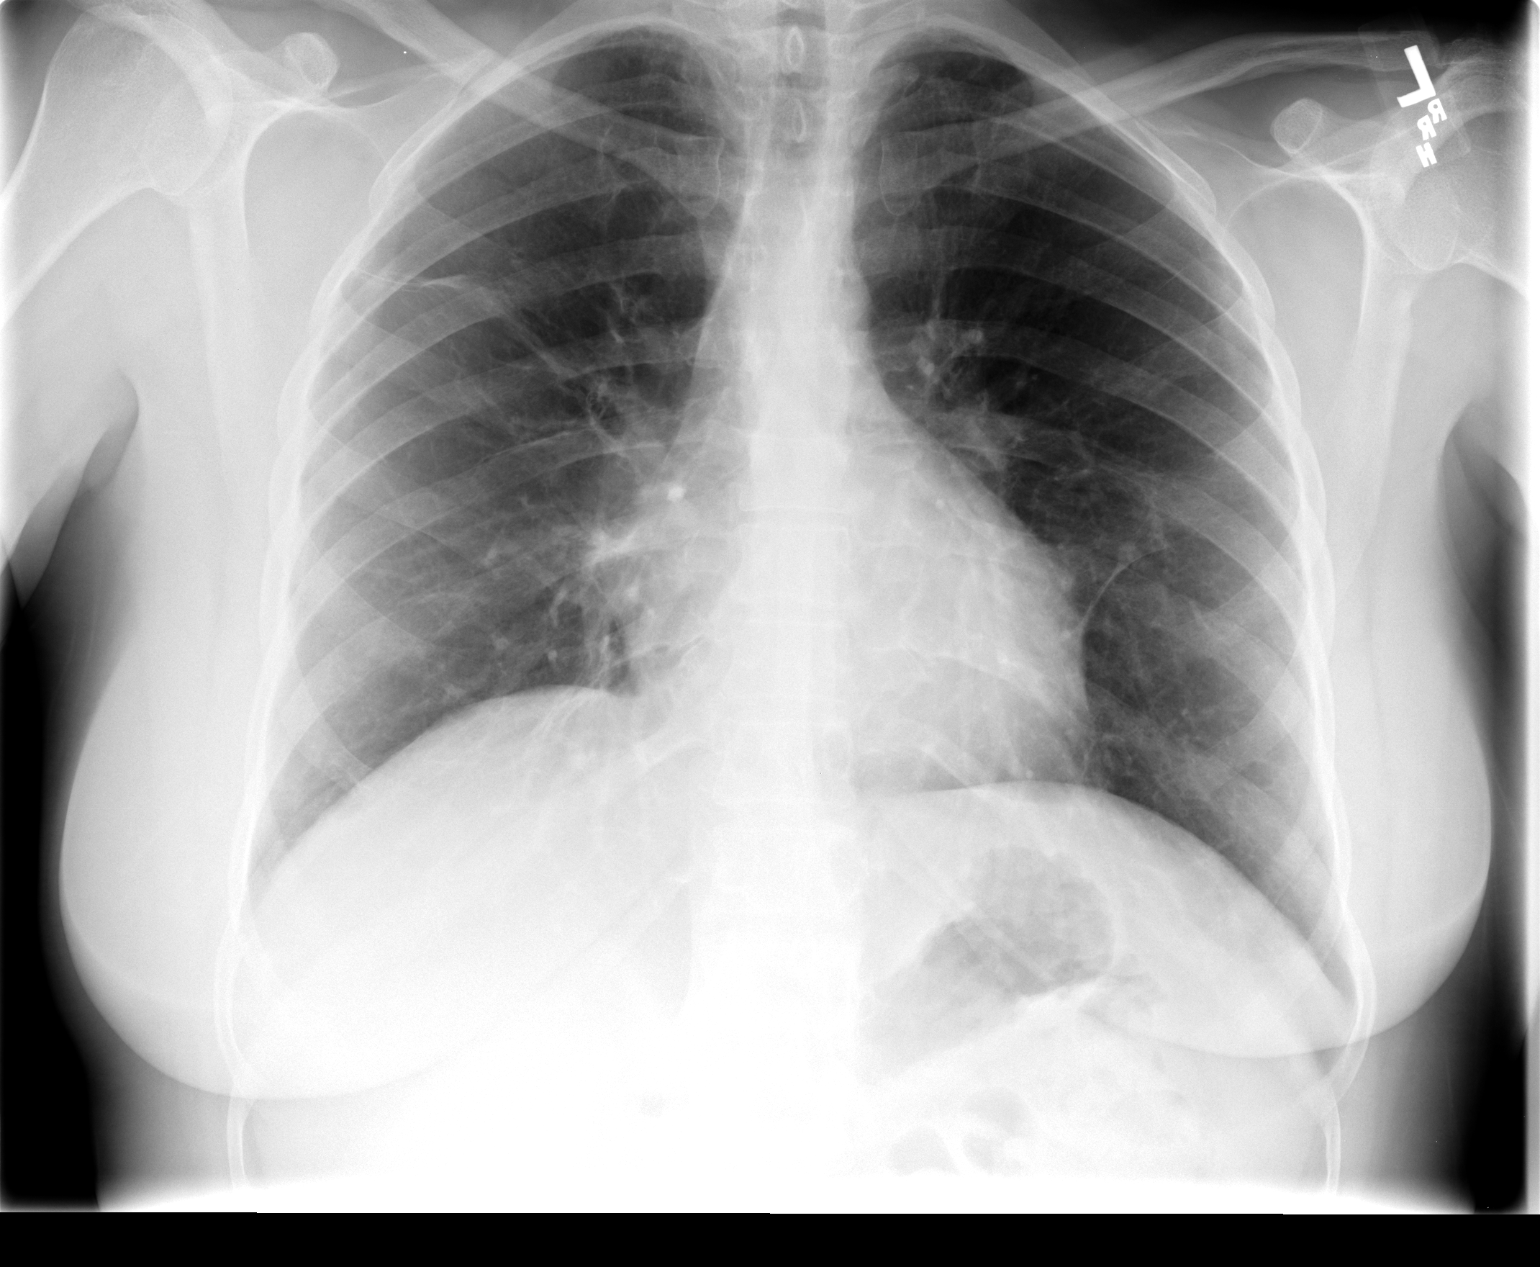

[view not recorded (2 of 2)]
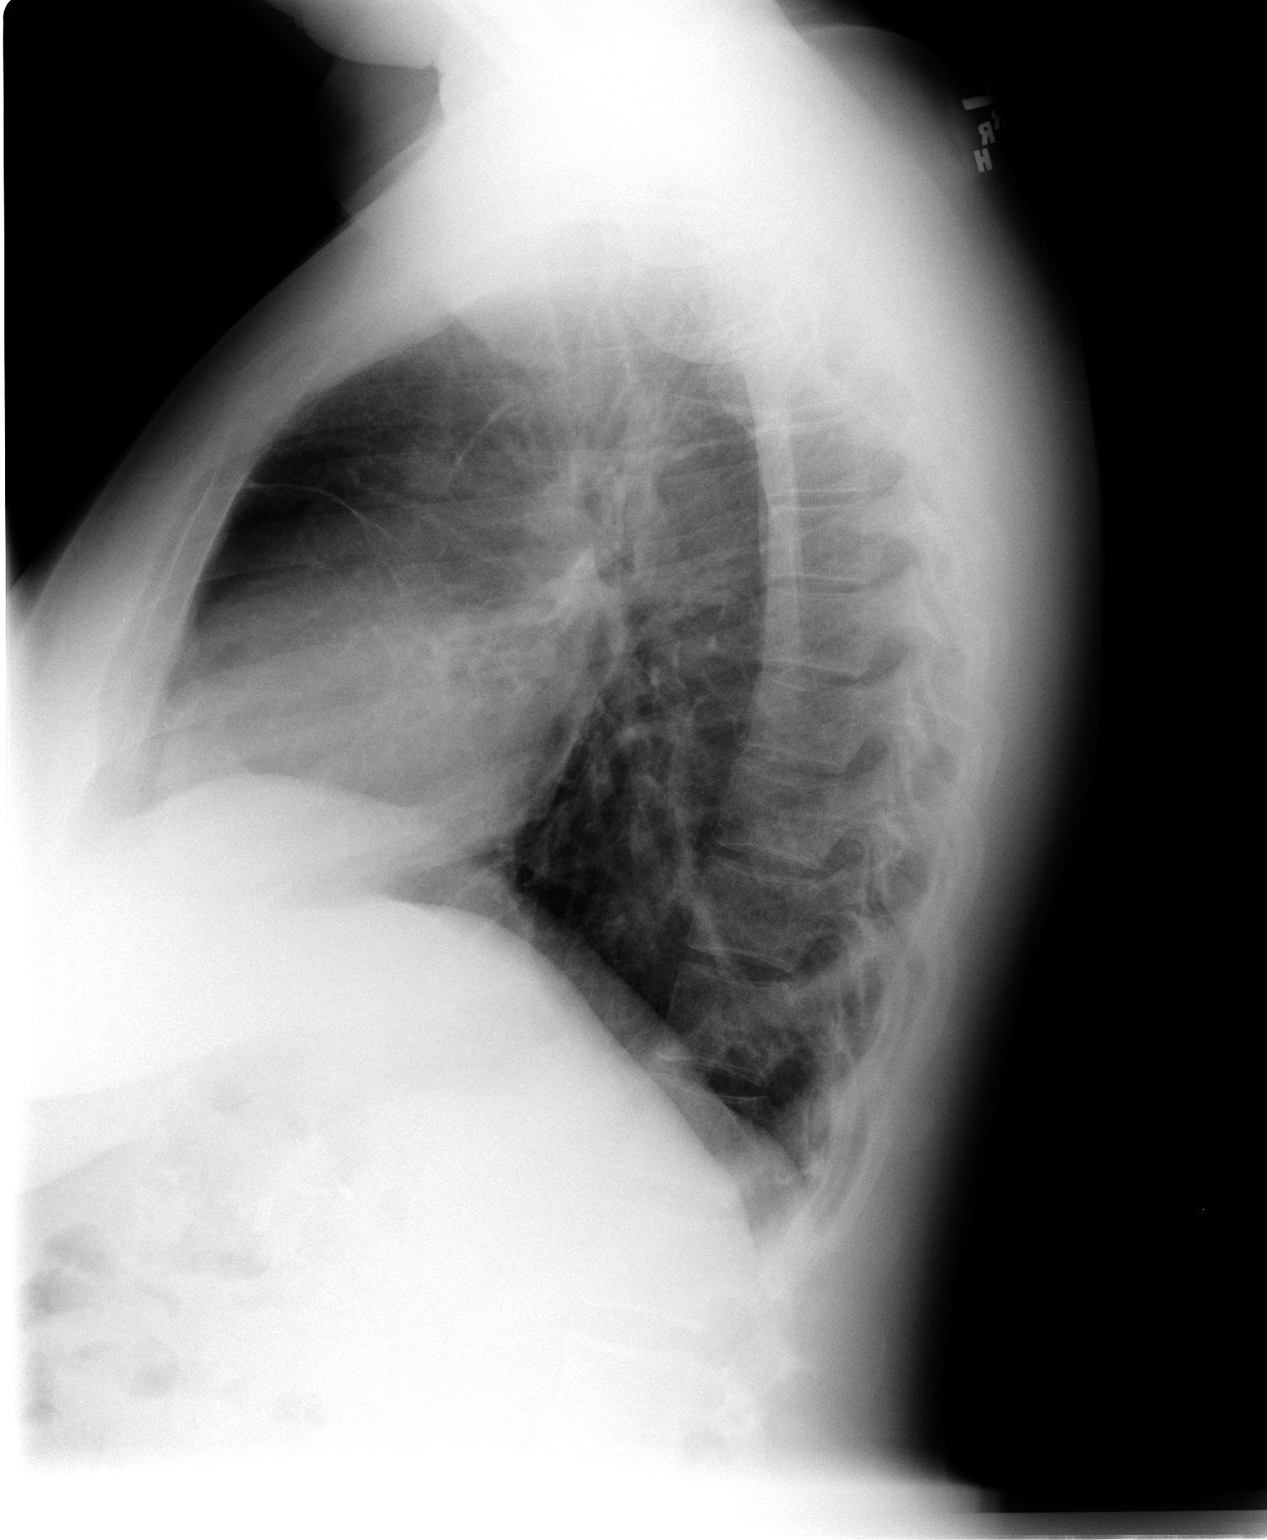

[2 of 2 positions shown; findings below may reference images not displayed]

FINDINGS: There is scarring in the anterior segment right upper lobe, medial
aspect of the right middle lobe, and in the lingula. Elsewhere lungs
are clear. Heart size and pulmonary vascularity are normal. No
adenopathy. No bone lesions.
IMPRESSION: Areas of scarring bilaterally, stable. No edema or consolidation. No
demonstrable adenopathy.

## 2015-10-05 ENCOUNTER — Encounter: Payer: Self-pay | Admitting: Gastroenterology

## 2015-10-07 ENCOUNTER — Telehealth: Payer: Self-pay | Admitting: Gastroenterology

## 2015-10-07 ENCOUNTER — Encounter: Payer: Self-pay | Admitting: Gastroenterology

## 2015-10-07 DIAGNOSIS — J158 Pneumonia due to other specified bacteria: Secondary | ICD-10-CM | POA: Diagnosis not present

## 2015-10-07 NOTE — Telephone Encounter (Signed)
APPT MADE AND LETTER SENT  °

## 2015-10-07 NOTE — Procedures (Signed)
SMART PILL TEST REPORT  TEST STATISTICS(H: MIN) GASTRIC EMPTYING TIME:   2:37   >4h DELAYED GASTRIC EMPTYING    GASTRIC pH:    HIGH 33.4     LOW: 3.4  SMALL BOWEL TRANSIT TIME:  7:53   NORMAL: 2.5-6h  COLON TRANSIT TIME:   14:15  >59h DELAYED COLONIC TRANSIT TIME   SMALL/LARGE BOWEL TRANSIT TIME: 22:09  > 64h DELAYED COMBINED TRANSIT TIME   WHOLE GUT TRANSIT TIME:   24:47  NORMAL < 73h   DIAGNOSIS: SLIGHTLY DELAYED SMALL BOWEL TRANSIT, NORMAL GASTRIC AND COLON TRANSIT TIME  PLAN: 1. CONTINUE TRULANCE. WILL NOT BENEFIT FROM SURGERY. 2. CONTINUE TO MONITOR SYMPTOMS. 3. PT NEEDS HBT TO ASSESS FOR SIBO. 4. OPV IN AUG 2017.

## 2015-10-07 NOTE — Telephone Encounter (Signed)
PLEASE CALL PT. HER SMART PILL SHOWS HER STOMACH AND COLON EMPTY NORMALLY. THE TRULANCE IS WORKING. SHE HAS SLIGHTLY DELAYED SMALL BOWEL EMPTYING WHICH CAN CAUSE ABDOMINAL PAIN AND BLOATING. SHE SHOULD COMPLETE THE HYDROGEN BREATH TEST TO SEE IF SHE HAS THE WRONG BACTERIA MIX IN HIS SMALL BOWEL WHICH CAN CAUSE BLOATING AND ABDOMINAL PAIN. OPV IN AUG 2017 CONSTIPATION, ABDOMINAL PAIN.

## 2015-10-10 DIAGNOSIS — I10 Essential (primary) hypertension: Secondary | ICD-10-CM | POA: Diagnosis not present

## 2015-10-10 DIAGNOSIS — E1165 Type 2 diabetes mellitus with hyperglycemia: Secondary | ICD-10-CM | POA: Diagnosis not present

## 2015-10-10 DIAGNOSIS — E784 Other hyperlipidemia: Secondary | ICD-10-CM | POA: Diagnosis not present

## 2015-10-10 NOTE — Telephone Encounter (Signed)
Pt is aware and OK to schedule the Hydrogen Breath Test.  

## 2015-10-10 NOTE — Telephone Encounter (Signed)
PT IS SET UP FOR 10/19/15 @ 7:00 SHE IS AWARE

## 2015-10-19 ENCOUNTER — Other Ambulatory Visit: Payer: Self-pay

## 2015-10-19 ENCOUNTER — Telehealth: Payer: Self-pay

## 2015-10-19 DIAGNOSIS — R140 Abdominal distension (gaseous): Secondary | ICD-10-CM

## 2015-10-19 DIAGNOSIS — R109 Unspecified abdominal pain: Secondary | ICD-10-CM

## 2015-10-19 NOTE — Telephone Encounter (Signed)
Pt is coming on Friday 10/21/15 @ 7:30 in stead of 10/24/15 Monday

## 2015-10-19 NOTE — Telephone Encounter (Signed)
Talked with patient this morning and told her that I was sorry for the scheduled appointment for today. I have her rescheduled for her HBT on 10/24/15 @ 7:00. She is aware and understands what to do.

## 2015-10-21 ENCOUNTER — Encounter (HOSPITAL_COMMUNITY)
Admission: RE | Disposition: A | Discharge status: Home / Self Care | Payer: Self-pay | Source: Ambulatory Visit | Attending: Gastroenterology

## 2015-10-21 ENCOUNTER — Ambulatory Visit (HOSPITAL_COMMUNITY)
Admission: RE | Admit: 2015-10-21 | Discharge: 2015-10-21 | Disposition: A | Discharge status: Home / Self Care | Payer: Medicare Other | Source: Ambulatory Visit | Attending: Gastroenterology | Admitting: Gastroenterology

## 2015-10-21 DIAGNOSIS — R140 Abdominal distension (gaseous): Secondary | ICD-10-CM | POA: Diagnosis not present

## 2015-10-21 DIAGNOSIS — K5901 Slow transit constipation: Secondary | ICD-10-CM | POA: Diagnosis not present

## 2015-10-21 DIAGNOSIS — R1012 Left upper quadrant pain: Secondary | ICD-10-CM | POA: Insufficient documentation

## 2015-10-21 HISTORY — PX: BACTERIAL OVERGROWTH TEST: SHX5739

## 2015-10-21 SURGERY — BREATH TEST, FOR INTESTINAL BACTERIAL OVERGROWTH

## 2015-10-21 MED ORDER — LACTULOSE 10 GM/15ML PO SOLN: 37.5000 g | Freq: Every day | ORAL | Status: DC

## 2015-10-21 MED ORDER — LACTULOSE 10 GM/15ML PO SOLN
ORAL | Status: AC
  Administered 2015-10-21: 37.5 g
  Filled 2015-10-21: qty 60

## 2015-10-21 NOTE — OR Nursing (Addendum)
No beans, bran or high fiber cereal the day before the procedure? no NPO except for water 12 hours before procedure? no No smoking, sleeping or vigorous exercising for at least 30 before procedure? no Recent antibiotic use and/or diarrhea? no  If yes, physician notified.  Time Baseline 15 mins 30 mins 45 mins 60 mins 75 mins 90 mins 105 mins 120 mins 135 mins 150 mins 165 mins 180 mins  H2-ppm 2 3 7 9 28  51 43 63 50 47 65 64 58  37.5 Lactulose given after baseline assessment

## 2015-10-26 ENCOUNTER — Encounter (HOSPITAL_COMMUNITY): Payer: Self-pay | Admitting: Gastroenterology

## 2015-10-30 ENCOUNTER — Telehealth: Payer: Self-pay | Admitting: Gastroenterology

## 2015-10-30 MED ORDER — AMOXICILLIN-POT CLAVULANATE 500-125 MG PO TABS: ORAL_TABLET | ORAL | 0 refills | Status: DC

## 2015-10-30 NOTE — Telephone Encounter (Signed)
  PLEASE CALL PT. HER HYDROGEN BREATH TEST IS POSITIVE FOR SMALL BOWEL BACTERIAL OVERGROWTH. TAKE A PROBIOTIC DAILY(ALIGN, [employer], OR [facility], OR CHEAPER ALTERNATIVE). SHE SHOULD TAKE AUGMENTIN 500 MG BID FOR 5 DAYS. AUGMENTIN CAN CAUSE NAUSEA, VOMITING, ABDOMINAL PAIN ,OR DIARRHEA. THE RX HAS BEEN SENT. PLEASE CALL WITH QUESTIONS OR CONCERNS. OPV AUG 2017.

## 2015-10-30 NOTE — Procedures (Signed)
  PRE-OPERATIVE DIAGNOSIS:  BLOATING, ABDOMINAL PAIN  POST-OPERATIVE DIAGNOSIS:  SIBO  PROCEDURE:  Procedure(s): HYDROGEN BREATH TEST  SURGEON:  Surgeon(s): Dorothyann Peng, MD  MEDICATIONS USED:  LACTULOSE  FINDINGS: BREATHALYZER- 0 MIN(2 PPM), FIRST PEAK (51 PPM, 75 MINS), TROUGH (43 PPM 90 MINS), SECOND PEAK (63 PPM, 105 MINS), SECOND TROUGH (50 PPM)120 MINS  DIAGNOSIS: SMALL BOWEL BACTERIAL OVERGROWTH.   PLAN OF CARE:  1. AUGMENTIN 500 MG BID FOR 5 DAYS. 2. Follow up in AUG 2017.

## 2015-10-31 NOTE — Telephone Encounter (Signed)
Pt is aware.  

## 2015-10-31 NOTE — Telephone Encounter (Signed)
Ov made °

## 2015-10-31 NOTE — Telephone Encounter (Signed)
Tried to call and no answer.  

## 2015-11-01 DIAGNOSIS — E1165 Type 2 diabetes mellitus with hyperglycemia: Secondary | ICD-10-CM | POA: Diagnosis not present

## 2015-11-01 DIAGNOSIS — E784 Other hyperlipidemia: Secondary | ICD-10-CM | POA: Diagnosis not present

## 2015-11-01 DIAGNOSIS — I10 Essential (primary) hypertension: Secondary | ICD-10-CM | POA: Diagnosis not present

## 2015-11-10 ENCOUNTER — Other Ambulatory Visit: Payer: Self-pay | Admitting: Obstetrics & Gynecology

## 2015-11-10 DIAGNOSIS — Z1231 Encounter for screening mammogram for malignant neoplasm of breast: Secondary | ICD-10-CM

## 2015-11-17 ENCOUNTER — Encounter: Payer: Self-pay | Admitting: Gastroenterology

## 2015-11-17 ENCOUNTER — Ambulatory Visit (INDEPENDENT_AMBULATORY_CARE_PROVIDER_SITE_OTHER): Payer: Medicare Other | Admitting: Gastroenterology

## 2015-11-17 DIAGNOSIS — B373 Candidiasis of vulva and vagina: Secondary | ICD-10-CM | POA: Diagnosis not present

## 2015-11-17 DIAGNOSIS — K219 Gastro-esophageal reflux disease without esophagitis: Secondary | ICD-10-CM

## 2015-11-17 DIAGNOSIS — K648 Other hemorrhoids: Secondary | ICD-10-CM

## 2015-11-17 DIAGNOSIS — K5901 Slow transit constipation: Secondary | ICD-10-CM

## 2015-11-17 DIAGNOSIS — B3731 Acute candidiasis of vulva and vagina: Secondary | ICD-10-CM

## 2015-11-17 DIAGNOSIS — D649 Anemia, unspecified: Secondary | ICD-10-CM

## 2015-11-17 MED ORDER — FLUCONAZOLE 150 MG PO TABS: ORAL_TABLET | ORAL | 0 refills | Status: DC

## 2015-11-17 NOTE — Progress Notes (Signed)
ON RECALL  °

## 2015-11-17 NOTE — Patient Instructions (Addendum)
WE NEED TO CHECK YOUR BLOOD COUNT AND IRON STORES.  TAKE DIFLUCAN 150 MG ONCE.  TAKE A PROBIOTIC DAILY ([employer] BRAND, [facility], OR ALIGN).    USE TRULANCE OR LINZESS IF NEEDED TO PREVENT CONSTIPATION.  DRINK WATER TO KEEP YOUR URINE LIGHT YELLOW.  FOLLOW A HIGH FIBER DIET.  PLEASE CALL IF SYMPTOMS REOCCUR, YOU WILL NEED ANOTHER COURSE OF ABX.  OTHERWISE, FOLLOW UP IN 6 MOS.

## 2015-11-17 NOTE — Progress Notes (Signed)
cc'ed to pcp °

## 2015-11-17 NOTE — Assessment & Plan Note (Signed)
SYMPTOMS DUE TO DELAYED SMALL BOWEL TRANSIT. SYMPTOMS FAIRLY WELL CONTROLLED EXCEPT STILL HAS MILD BLOATING.  TAKE DIFLUCAN 150 MG ONCE. TAKE A PROBIOTIC DAILY ([employer] BRAND, [facility], OR ALIGN).   USE TRULANCE OR LINZESS IF NEEDED TO PREVENT CONSTIPATION. DRINK WATER TO KEEP YOUR URINE LIGHT YELLOW. FOLLOW A HIGH FIBER DIET. PLEASE CALL IF SYMPTOMS REOCCUR, YOU WILL NEED ANOTHER COURSE OF ABX. OTHERWISE, FOLLOW UP IN 6 MOS.

## 2015-11-17 NOTE — Assessment & Plan Note (Signed)
NO BRBPR OR MELENA. TAKING IBUPROFEN.  CBC TODAY.

## 2015-11-17 NOTE — Assessment & Plan Note (Signed)
SECONDARY TO ABX. SYMPTOMS NOT CONTROLLED.  TAKE DIFLUCAN 150 MG ONCE. CALL WITH QUESTION OR CONCERNS.

## 2015-11-17 NOTE — Assessment & Plan Note (Signed)
SYMPTOMS CONTROLLED/RESOLVED.  CONTINUE OMEPRAZOLE.  TAKE 30 MINUTES PRIOR TO YOUR FIRST MEAL. CONTINUE TO MONITOR SYMPTOMS.   

## 2015-11-17 NOTE — Progress Notes (Signed)
Subjective:    Patient ID: [name], female    DOB: 1965/05/06, 50 y.o.   MRN: [id number] [name], MD  HPI TOOK ABX AND FEELS LIKE SHE HAS A YEAST INFECTION IN HER VAGINA. HAD SOME CREAM FROM HER GYN. BMs: DOING GOOD. NOT TAKING TRULANCE OR LINZESS. DON'T NEED MIRALAX. OCCASIONAL NAUSEA BUT HAS PHENERGAN PRN. TAKING A PROBIOTIC DAILY.  PT DENIES FEVER, CHILLS, HEMATOCHEZIA, HEMATEMESIS, nausea, vomiting, melena, diarrhea, CHEST PAIN, SHORTNESS OF BREATH, CHANGE IN BOWEL IN HABITS, constipation, abdominal pain, OR PROBLEMS swallowing, heartburn or indigestion.   Past Medical History:  Diagnosis Date  . Abnormal uterine bleeding (AUB) 11/26/2012  . 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 Asthma   . BV (bacterial vaginosis) 11/26/2012  . Constipation   . Diabetes mellitus   . Fatigue 12/24/2012  . Fibroids 01/01/2013  . GERD (gastroesophageal reflux disease)   . Helicobacter pylori gastritis JAN 2017   EGD Bx  . Hematuria 04/05/2015  . Hemorrhoid 01/01/2013  . Hemorrhoids 04/05/2015  . Hypertension   . IBS (irritable bowel syndrome)   . Neuropathy due to secondary diabetes ([place])   . Other and unspecified ovarian cyst 01/01/2013  . Sarcoidosis ([place])   . Vaginal discharge 02/12/2013   +yeast  . Vaginal itching 02/14/2015  . Vaginal odor 04/05/2015    Past Surgical History:  Procedure Laterality Date  . BACTERIAL OVERGROWTH TEST N/A 10/21/2015   Procedure: BACTERIAL OVERGROWTH TEST;  Surgeon: Danie Binder, MD;  [facility];  Service: Endoscopy;  Laterality: N/A;  0800  . CHOLECYSTECTOMY    . COLONOSCOPY N/A 08/20/2013   Dr. Oneida Alar: normal mucosa in terminal ileum, mild diverticulosis in ascending colon, moderate sized hemorrhoids s/p banding  . ENDOMETRIAL ABLATION    . ESOPHAGOGASTRODUODENOSCOPY N/A 04/13/2015   SLF: 1. FEDA due ot erosive gastriitis/ rectal bleeding   . FLEXIBLE SIGMOIDOSCOPY N/A 04/13/2015   SLF: 1. Rectal bleeding due to moderate sized  internal hemorrhoids  . HEMORRHOID BANDING  08/20/2013   Procedure: HEMORRHOID BANDING;  Surgeon: Danie Binder, MD;  [facility];  Service: Endoscopy;;  . HEMORRHOID BANDING  04/13/2015   Procedure: Thayer Jew;  Surgeon: Danie Binder, MD;  [facility];  Service: Endoscopy;;  . HEMORRHOID SURGERY     banding  . HEMORRHOID SURGERY N/A 12/25/2013   Procedure: EXTENSIVE HEMORRHOIDECTOMY;  Surgeon: Jamesetta So, MD;  Location: [facility];  Service: General;  Laterality: N/A;  . SMART PILL PROCEDURE N/A 09/19/2015   Procedure: SMART PILL PROCEDURE;  Surgeon: Danie Binder, MD;  [facility];  Service: Endoscopy;  Laterality: N/A;  0800  . TUBAL LIGATION      Allergies  Allergen Reactions  . Shellfish Allergy Anaphylaxis  . Other Itching    pinapple   Current Outpatient Prescriptions  Medication Sig Dispense Refill  . Ascorbic Acid (VITAMIN C) 100 MG tablet Take 100 mg by mouth daily.    Marland Kitchen atorvastatin (LIPITOR) 20 MG tablet Take 20 mg by mouth at bedtime.     . benazepril (LOTENSIN) 10 MG tablet Take 10 mg by mouth daily.     . fenofibrate (TRICOR) 145 MG tablet Take 145 mg by mouth at bedtime.     Marland Kitchen iADVIL,MOTRIN) 800 MG tablet  TAKE ONE TABLET Q8H PRN FOR PAIN    . GLUCOPHAGE 1000 MG tablet Take 1,000 mg BIDy with a meal.     . PRILOSEC 20 MG capsule  20 mg by mouth daily. )    . ranitidine (ZANTAC) 150 MG tablet Take 300 mg by mouthBID PRN for heartburn.     Review of Systems PER HPI OTHERWISE ALL SYSTEMS ARE NEGATIVE.     Objective:   Physical Exam  Constitutional: She is oriented to person, place, and time. She appears well-developed and well-nourished. No distress.  HENT:  Head: Normocephalic and atraumatic.  Mouth/Throat: Oropharynx is clear and moist. No oropharyngeal exudate.  Eyes: Pupils are equal, round, and reactive to light. No scleral icterus.  Neck: Normal range of motion. Neck supple.  Cardiovascular: Normal rate, regular  rhythm and normal heart sounds.   Pulmonary/Chest: Effort normal and breath sounds normal. No respiratory distress.  Abdominal: Soft. Bowel sounds are normal. She exhibits no distension. There is no tenderness.  Musculoskeletal: She exhibits no edema.  Lymphadenopathy:    She has no cervical adenopathy.  Neurological: She is alert and oriented to person, place, and time.  Psychiatric: She has a normal mood and affect.  Vitals reviewed.     Assessment & Plan:

## 2015-11-17 NOTE — Assessment & Plan Note (Signed)
SYMPTOMS CONTROLLED/RESOLVED.  CONTINUE TO MONITOR SYMPTOMS. 

## 2015-11-22 ENCOUNTER — Ambulatory Visit: Payer: Medicare Other | Admitting: Adult Health

## 2015-11-28 DIAGNOSIS — D649 Anemia, unspecified: Secondary | ICD-10-CM | POA: Diagnosis not present

## 2015-11-29 LAB — CBC WITH DIFFERENTIAL/PLATELET
BASOS PCT: 0 %
Basophils Absolute: 0 cells/uL (ref 0–200)
EOS ABS: 70 {cells}/uL (ref 15–500)
EOS PCT: 1 %
HCT: 36.2 % (ref 35.0–45.0)
Hemoglobin: 12.1 g/dL (ref 11.7–15.5)
LYMPHS PCT: 40 %
Lymphs Abs: 2800 cells/uL (ref 850–3900)
MCH: 28.7 pg (ref 27.0–33.0)
MCHC: 33.4 g/dL (ref 32.0–36.0)
MCV: 85.8 fL (ref 80.0–100.0)
MONOS PCT: 9 %
MPV: 11.2 fL (ref 7.5–12.5)
Monocytes Absolute: 630 cells/uL (ref 200–950)
NEUTROS ABS: 3500 {cells}/uL (ref 1500–7800)
Neutrophils Relative %: 50 %
PLATELETS: 271 10*3/uL (ref 140–400)
RBC: 4.22 MIL/uL (ref 3.80–5.10)
RDW: 13.6 % (ref 11.0–15.0)
WBC: 7 10*3/uL (ref 3.8–10.8)

## 2015-11-29 LAB — FERRITIN: FERRITIN: 39 ng/mL (ref 10–232)

## 2015-12-02 NOTE — Progress Notes (Signed)
PLEASE CALL PT. HER BLOOD COUNT IS NORMAL. HER FERRITIN IS NORMAL.

## 2015-12-06 NOTE — Progress Notes (Signed)
On recall  °

## 2015-12-06 NOTE — Progress Notes (Signed)
CC'ED TO PCP 

## 2015-12-06 NOTE — Progress Notes (Signed)
PT is aware.

## 2015-12-08 DIAGNOSIS — I10 Essential (primary) hypertension: Secondary | ICD-10-CM | POA: Diagnosis not present

## 2015-12-08 DIAGNOSIS — E1165 Type 2 diabetes mellitus with hyperglycemia: Secondary | ICD-10-CM | POA: Diagnosis not present

## 2015-12-08 DIAGNOSIS — E784 Other hyperlipidemia: Secondary | ICD-10-CM | POA: Diagnosis not present

## 2015-12-15 DIAGNOSIS — J028 Acute pharyngitis due to other specified organisms: Secondary | ICD-10-CM | POA: Diagnosis not present

## 2015-12-15 DIAGNOSIS — F338 Other recurrent depressive disorders: Secondary | ICD-10-CM | POA: Diagnosis not present

## 2015-12-19 ENCOUNTER — Ambulatory Visit (HOSPITAL_COMMUNITY)
Admission: RE | Admit: 2015-12-19 | Discharge: 2015-12-19 | Disposition: A | Discharge status: Home / Self Care | Payer: Medicare Other | Source: Ambulatory Visit | Attending: Obstetrics & Gynecology | Admitting: Obstetrics & Gynecology

## 2015-12-19 DIAGNOSIS — Z1231 Encounter for screening mammogram for malignant neoplasm of breast: Secondary | ICD-10-CM | POA: Insufficient documentation

## 2016-01-19 DIAGNOSIS — E1165 Type 2 diabetes mellitus with hyperglycemia: Secondary | ICD-10-CM | POA: Diagnosis not present

## 2016-01-19 DIAGNOSIS — I10 Essential (primary) hypertension: Secondary | ICD-10-CM | POA: Diagnosis not present

## 2016-01-19 DIAGNOSIS — E784 Other hyperlipidemia: Secondary | ICD-10-CM | POA: Diagnosis not present

## 2016-01-20 DIAGNOSIS — H04123 Dry eye syndrome of bilateral lacrimal glands: Secondary | ICD-10-CM | POA: Diagnosis not present

## 2016-02-21 DIAGNOSIS — I10 Essential (primary) hypertension: Secondary | ICD-10-CM | POA: Diagnosis not present

## 2016-02-21 DIAGNOSIS — E784 Other hyperlipidemia: Secondary | ICD-10-CM | POA: Diagnosis not present

## 2016-02-21 DIAGNOSIS — E1165 Type 2 diabetes mellitus with hyperglycemia: Secondary | ICD-10-CM | POA: Diagnosis not present

## 2016-02-22 DIAGNOSIS — E784 Other hyperlipidemia: Secondary | ICD-10-CM | POA: Diagnosis not present

## 2016-02-22 DIAGNOSIS — Z1389 Encounter for screening for other disorder: Secondary | ICD-10-CM | POA: Diagnosis not present

## 2016-02-22 DIAGNOSIS — I10 Essential (primary) hypertension: Secondary | ICD-10-CM | POA: Diagnosis not present

## 2016-02-22 DIAGNOSIS — E1143 Type 2 diabetes mellitus with diabetic autonomic (poly)neuropathy: Secondary | ICD-10-CM | POA: Diagnosis not present

## 2016-02-22 DIAGNOSIS — Z0000 Encounter for general adult medical examination without abnormal findings: Secondary | ICD-10-CM | POA: Diagnosis not present

## 2016-02-22 DIAGNOSIS — F338 Other recurrent depressive disorders: Secondary | ICD-10-CM | POA: Diagnosis not present

## 2016-02-27 DIAGNOSIS — Z23 Encounter for immunization: Secondary | ICD-10-CM | POA: Diagnosis not present

## 2016-03-18 ENCOUNTER — Other Ambulatory Visit: Payer: Self-pay | Admitting: Gastroenterology

## 2016-03-21 DIAGNOSIS — E784 Other hyperlipidemia: Secondary | ICD-10-CM | POA: Diagnosis not present

## 2016-03-21 DIAGNOSIS — E1143 Type 2 diabetes mellitus with diabetic autonomic (poly)neuropathy: Secondary | ICD-10-CM | POA: Diagnosis not present

## 2016-03-21 DIAGNOSIS — I10 Essential (primary) hypertension: Secondary | ICD-10-CM | POA: Diagnosis not present

## 2016-03-21 DIAGNOSIS — F338 Other recurrent depressive disorders: Secondary | ICD-10-CM | POA: Diagnosis not present

## 2016-03-22 DIAGNOSIS — D869 Sarcoidosis, unspecified: Secondary | ICD-10-CM | POA: Diagnosis not present

## 2016-03-22 DIAGNOSIS — J449 Chronic obstructive pulmonary disease, unspecified: Secondary | ICD-10-CM | POA: Diagnosis not present

## 2016-03-28 ENCOUNTER — Encounter: Payer: Self-pay | Admitting: Gastroenterology

## 2016-04-05 DIAGNOSIS — R0789 Other chest pain: Secondary | ICD-10-CM | POA: Diagnosis not present

## 2016-04-05 DIAGNOSIS — Z8249 Family history of ischemic heart disease and other diseases of the circulatory system: Secondary | ICD-10-CM | POA: Diagnosis not present

## 2016-04-05 DIAGNOSIS — I10 Essential (primary) hypertension: Secondary | ICD-10-CM | POA: Diagnosis not present

## 2016-04-05 DIAGNOSIS — R69 Illness, unspecified: Secondary | ICD-10-CM | POA: Diagnosis not present

## 2016-04-05 DIAGNOSIS — R509 Fever, unspecified: Secondary | ICD-10-CM | POA: Diagnosis not present

## 2016-04-05 DIAGNOSIS — E119 Type 2 diabetes mellitus without complications: Secondary | ICD-10-CM | POA: Diagnosis not present

## 2016-04-05 DIAGNOSIS — R319 Hematuria, unspecified: Secondary | ICD-10-CM | POA: Diagnosis not present

## 2016-04-05 DIAGNOSIS — N390 Urinary tract infection, site not specified: Secondary | ICD-10-CM | POA: Diagnosis not present

## 2016-04-05 DIAGNOSIS — J45909 Unspecified asthma, uncomplicated: Secondary | ICD-10-CM | POA: Diagnosis not present

## 2016-04-09 ENCOUNTER — Other Ambulatory Visit (HOSPITAL_COMMUNITY)
Admission: RE | Admit: 2016-04-09 | Discharge: 2016-04-09 | Disposition: A | Discharge status: Home / Self Care | Payer: Medicare HMO | Source: Ambulatory Visit | Attending: Adult Health | Admitting: Adult Health

## 2016-04-09 ENCOUNTER — Ambulatory Visit (INDEPENDENT_AMBULATORY_CARE_PROVIDER_SITE_OTHER): Payer: Medicare HMO | Admitting: Adult Health

## 2016-04-09 ENCOUNTER — Encounter: Payer: Self-pay | Admitting: Adult Health

## 2016-04-09 VITALS — BP 121/65 | HR 80 | Ht 66.0 in | Wt 180.0 lb

## 2016-04-09 DIAGNOSIS — Z1211 Encounter for screening for malignant neoplasm of colon: Secondary | ICD-10-CM

## 2016-04-09 DIAGNOSIS — Z113 Encounter for screening for infections with a predominantly sexual mode of transmission: Secondary | ICD-10-CM | POA: Insufficient documentation

## 2016-04-09 DIAGNOSIS — Z1151 Encounter for screening for human papillomavirus (HPV): Secondary | ICD-10-CM | POA: Diagnosis present

## 2016-04-09 DIAGNOSIS — Z1212 Encounter for screening for malignant neoplasm of rectum: Secondary | ICD-10-CM

## 2016-04-09 DIAGNOSIS — N390 Urinary tract infection, site not specified: Secondary | ICD-10-CM

## 2016-04-09 DIAGNOSIS — Z01419 Encounter for gynecological examination (general) (routine) without abnormal findings: Secondary | ICD-10-CM | POA: Insufficient documentation

## 2016-04-09 DIAGNOSIS — Z01411 Encounter for gynecological examination (general) (routine) with abnormal findings: Secondary | ICD-10-CM | POA: Diagnosis not present

## 2016-04-09 DIAGNOSIS — L739 Follicular disorder, unspecified: Secondary | ICD-10-CM

## 2016-04-09 DIAGNOSIS — R69 Illness, unspecified: Secondary | ICD-10-CM | POA: Diagnosis not present

## 2016-04-09 LAB — POCT URINALYSIS DIPSTICK
Glucose, UA: NEGATIVE
Leukocytes, UA: NEGATIVE
NITRITE UA: NEGATIVE
Protein, UA: NEGATIVE
RBC UA: NEGATIVE

## 2016-04-09 LAB — HEMOCCULT GUIAC POC 1CARD (OFFICE): Fecal Occult Blood, POC: NEGATIVE

## 2016-04-09 MED ORDER — IBUPROFEN 800 MG PO TABS
800.0000 mg | ORAL_TABLET | Freq: Three times a day (TID) | ORAL | 2 refills | Status: DC | PRN
Start: 2016-04-09 — End: 2016-11-19

## 2016-04-09 NOTE — Patient Instructions (Signed)
F/U with PCP if not better Physical in 1 year  Mammogram yearly Labs with PCP

## 2016-04-09 NOTE — Progress Notes (Signed)
Patient ID: [name], female   DOB: 1965-12-09, 51 y.o.   MRN: [id number] History of Present Illness: [name] is a 51 year old black female in for well woman gyn exam and pap.She was seen in ER at [facility] 04/05/16  And treated for UTI, had had chills, and fever, got shot there and is on Keflex, but still feels bad. PCP is Dr Zada Girt.   Current Medications, Allergies, Past Medical History, Past Surgical History, Family History and Social History were reviewed in [employer] record.     Review of Systems: Patient denies any headaches, hearing loss, fatigue, blurred vision, shortness of breath, chest pain, abdominal pain, problems with bowel movements, urination, or intercourse. No joint pain or mood swings.    Physical Exam:BP 121/65 (BP Location: Left Arm, Patient Position: Sitting, Cuff Size: Normal)   Pulse 80   Ht 5\' 6"  (1.676 m)   Wt 180 lb (81.6 kg)   BMI 29.05 kg/m urine dipstick negative. General:  Well developed, well nourished, no acute distress Skin:  Warm and dry Neck:  Midline trachea, normal thyroid, good ROM, no lymphadenopathy Lungs; Clear to auscultation bilaterally Breast:  No dominant palpable mass, retraction, or nipple discharge Cardiovascular: Regular rate and rhythm Abdomen:  Soft, non tender, no hepatosplenomegaly, has area of folliculitis in mons pubis area Pelvic:  External genitalia is normal in appearance, no lesions.  The vagina is normal in appearance. Urethra has no lesions or masses. The cervix is bulbous.Pap with GC.CHL and HPV performed.  Uterus is felt to be normal size, shape, and contour.  No adnexal masses or tenderness noted.Bladder is non tender, no masses felt. Rectal: Good sphincter tone, no polyps, or hemorrhoids felt.  Hemoccult negative. Extremities/musculoskeletal:  No swelling or varicosities noted, no clubbing or cyanosis Psych:  No mood changes, alert and cooperative,seems happy   Impression: 1. Encounter  for gynecological examination with Papanicolaou smear of cervix   2. Folliculitis   3. Screening for colorectal cancer       Plan: Physical in 1 year, pap in 3 if normal Mammogram yearly Labs with PCP  Colonoscopy per Dr Oneida Alar  Follow up with PCP if not feeling better  Refilled ibuprofen 800 mg x 2

## 2016-04-09 NOTE — Addendum Note (Signed)
Addended by: Derrek Monaco A on: 04/09/2016 12:26 PM   Modules accepted: Orders

## 2016-04-11 LAB — CYTOLOGY - PAP
CHLAMYDIA, DNA PROBE: NEGATIVE
Diagnosis: NEGATIVE
HPV (WINDOPATH): NOT DETECTED
NEISSERIA GONORRHEA: NEGATIVE

## 2016-04-17 DIAGNOSIS — F338 Other recurrent depressive disorders: Secondary | ICD-10-CM | POA: Diagnosis not present

## 2016-04-17 DIAGNOSIS — R69 Illness, unspecified: Secondary | ICD-10-CM | POA: Diagnosis not present

## 2016-04-17 DIAGNOSIS — E784 Other hyperlipidemia: Secondary | ICD-10-CM | POA: Diagnosis not present

## 2016-04-17 DIAGNOSIS — E1143 Type 2 diabetes mellitus with diabetic autonomic (poly)neuropathy: Secondary | ICD-10-CM | POA: Diagnosis not present

## 2016-04-17 DIAGNOSIS — I10 Essential (primary) hypertension: Secondary | ICD-10-CM | POA: Diagnosis not present

## 2016-05-10 ENCOUNTER — Ambulatory Visit: Payer: Medicare Other | Admitting: Gastroenterology

## 2016-05-10 DIAGNOSIS — R69 Illness, unspecified: Secondary | ICD-10-CM | POA: Diagnosis not present

## 2016-05-10 DIAGNOSIS — F338 Other recurrent depressive disorders: Secondary | ICD-10-CM | POA: Diagnosis not present

## 2016-05-10 DIAGNOSIS — I10 Essential (primary) hypertension: Secondary | ICD-10-CM | POA: Diagnosis not present

## 2016-05-10 DIAGNOSIS — E784 Other hyperlipidemia: Secondary | ICD-10-CM | POA: Diagnosis not present

## 2016-05-10 DIAGNOSIS — E1143 Type 2 diabetes mellitus with diabetic autonomic (poly)neuropathy: Secondary | ICD-10-CM | POA: Diagnosis not present

## 2016-05-21 DIAGNOSIS — E784 Other hyperlipidemia: Secondary | ICD-10-CM | POA: Diagnosis not present

## 2016-05-21 DIAGNOSIS — R69 Illness, unspecified: Secondary | ICD-10-CM | POA: Diagnosis not present

## 2016-05-21 DIAGNOSIS — I10 Essential (primary) hypertension: Secondary | ICD-10-CM | POA: Diagnosis not present

## 2016-05-21 DIAGNOSIS — E1143 Type 2 diabetes mellitus with diabetic autonomic (poly)neuropathy: Secondary | ICD-10-CM | POA: Diagnosis not present

## 2016-05-21 DIAGNOSIS — Z6829 Body mass index (BMI) 29.0-29.9, adult: Secondary | ICD-10-CM | POA: Diagnosis not present

## 2016-05-31 ENCOUNTER — Ambulatory Visit (INDEPENDENT_AMBULATORY_CARE_PROVIDER_SITE_OTHER): Payer: Medicare HMO | Admitting: Gastroenterology

## 2016-05-31 ENCOUNTER — Encounter: Payer: Self-pay | Admitting: Gastroenterology

## 2016-05-31 DIAGNOSIS — R7989 Other specified abnormal findings of blood chemistry: Secondary | ICD-10-CM | POA: Diagnosis not present

## 2016-05-31 DIAGNOSIS — K2970 Gastritis, unspecified, without bleeding: Secondary | ICD-10-CM

## 2016-05-31 DIAGNOSIS — K219 Gastro-esophageal reflux disease without esophagitis: Secondary | ICD-10-CM

## 2016-05-31 DIAGNOSIS — D649 Anemia, unspecified: Secondary | ICD-10-CM | POA: Diagnosis not present

## 2016-05-31 DIAGNOSIS — K648 Other hemorrhoids: Secondary | ICD-10-CM

## 2016-05-31 DIAGNOSIS — R1033 Periumbilical pain: Secondary | ICD-10-CM | POA: Diagnosis not present

## 2016-05-31 DIAGNOSIS — R945 Abnormal results of liver function studies: Secondary | ICD-10-CM

## 2016-05-31 DIAGNOSIS — B9681 Helicobacter pylori [H. pylori] as the cause of diseases classified elsewhere: Secondary | ICD-10-CM | POA: Diagnosis not present

## 2016-05-31 MED ORDER — DICYCLOMINE HCL 10 MG PO CAPS: ORAL_CAPSULE | ORAL | 11 refills | Status: DC

## 2016-05-31 NOTE — Assessment & Plan Note (Signed)
JAN 2017 DIAGNOSIS.  WILL NEED TO CONFIRM ERADICATION.

## 2016-05-31 NOTE — Patient Instructions (Addendum)
SUBMIT STOOL SAMPLE.  DRINK WATER TO KEEP YOUR URINE LIGHT YELLOW.  TAKE DICYCLOMINE 30 MINUTES PRIOR TO BREAKFAST AND LUNCH EVERY DAY OR EVERY OTHER DAY TO REDUCE ABDOMINAL PAIN AND LOOSE STOOLS. IT MAY CAUSE DROWSINESS, DRY EYES/MOUTH, BLURRY VISION, CONSTIPATION, OR DIFFICULTY URINATING.   PLEASE CALL IN 2 WEEKS IF YOUR SYMPTOMS ARE NOT IMPROVED.   FOLLOW UP IN 4 MOS.

## 2016-05-31 NOTE — Progress Notes (Signed)
Subjective:    Patient ID: [name], female    DOB: Jan 01, 1966, 51 y.o.   MRN: [id number]  [name], MD  HPI Trouble with pain at her navel. STARTED YESTERDAY. OFF AND ON. TRIGGERS: ??? HURTS REAL BAD AND THEN USES BATHROOM(URINE/STOOL) BETTER: AFTER BM. BEEN EATING CEREAL AND REGULAR MILK. BMs: LOOSE. APPETITE: SO SO/ ENERGY LEVEL: STAYS LOW. HAD ONE EPISODE NAUSEA/VOMITING AND HAD TO HAVE A BM(DIARRHEA). HAPPENED 2-3 WEEKS AGO. LOTS OF STRESS-FAMILY. LAST TOOK ABX: 2018. HAD TWO KIDS: AGE 65, 65 YO, 1 GRANDCHILD.  PT DENIES FEVER, CHILLS, HEMATOCHEZIA, HEMATEMESIS, melena, CHEST PAIN, SHORTNESS OF BREATH,  CHANGE IN BOWEL IN HABITS, constipation, DYSURIA, HEMATURIA, problems swallowing, OR heartburn or indigestion.  Past Medical History:  Diagnosis Date  . Abnormal uterine bleeding (AUB) 11/26/2012  . 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 Asthma   . BV (bacterial vaginosis) 11/26/2012  . Constipation   . Diabetes mellitus   . Fatigue 12/24/2012  . Fibroids 01/01/2013  . GERD (gastroesophageal reflux disease)   . Helicobacter pylori gastritis JAN 2017   EGD Bx  . Hematuria 04/05/2015  . Hemorrhoid 01/01/2013  . Hemorrhoids 04/05/2015  . Hypertension   . IBS (irritable bowel syndrome)   . 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 Other and unspecified ovarian cyst 01/01/2013  . Sarcoidosis ([place])   . Vaginal discharge 02/12/2013   +yeast  . Vaginal itching 02/14/2015  . Vaginal odor 04/05/2015   Past Surgical History:  Procedure Laterality Date  . BACTERIAL OVERGROWTH TEST N/A 10/21/2015   Procedure: BACTERIAL OVERGROWTH TEST;  Surgeon: Danie Binder, MD;  [facility];  Service: Endoscopy;  Laterality: N/A;  0800  . CHOLECYSTECTOMY    . COLONOSCOPY N/A 08/20/2013   Dr. Oneida Alar: normal mucosa in terminal ileum, mild diverticulosis in ascending colon, moderate sized hemorrhoids s/p banding  . ENDOMETRIAL ABLATION    . ESOPHAGOGASTRODUODENOSCOPY N/A  04/13/2015   SLF: 1. FEDA due ot erosive gastriitis/ rectal bleeding   . FLEXIBLE SIGMOIDOSCOPY N/A 04/13/2015   SLF: 1. Rectal bleeding due to moderate sized internal hemorrhoids  . HEMORRHOID BANDING  08/20/2013   Procedure: HEMORRHOID BANDING;  Surgeon: Danie Binder, MD;  [facility];  Service: Endoscopy;;  . HEMORRHOID BANDING  04/13/2015   Procedure: Thayer Jew;  Surgeon: Danie Binder, MD;  [facility];  Service: Endoscopy;;  . HEMORRHOID SURGERY     banding  . HEMORRHOID SURGERY N/A 12/25/2013   Procedure: EXTENSIVE HEMORRHOIDECTOMY;  Surgeon: Jamesetta So, MD;  Location: [facility];  Service: General;  Laterality: N/A;  . SMART PILL PROCEDURE N/A 09/19/2015   Procedure: SMART PILL PROCEDURE;  Surgeon: Danie Binder, MD;  [facility];  Service: Endoscopy;  Laterality: N/A;  0800  . TUBAL LIGATION     Allergies  Allergen Reactions  . Shellfish Allergy Anaphylaxis  . Other Itching    pinapple   Current Outpatient Prescriptions  Medication Sig Dispense Refill  . atorvastatin (LIPITOR) 20 MG tablet Take 20 mg by mouth at bedtime.     . benazepril (LOTENSIN) 10 MG tablet Take 10 mg by mouth daily.     . fenofibrate (TRICOR) 145 MG tablet Take 145 mg by mouth at bedtime.     Marland Kitchen ibuprofen (ADVIL,MOTRIN) 800 MG tablet Take 1 tablet (800 mg total) by mouth every 8 (eight) hours as needed. PRN HAs   . metFORMIN (GLUCOPHAGE) 1000 MG tablet Take  1,000 mg by mouth 2 (two) times daily with a meal.     . omeprazole (PRILOSEC) 20 MG capsule TAKE ONE CAPSULE BY MOUTH TWICE DAILY     .       Review of Systems PER HPI OTHERWISE ALL SYSTEMS ARE NEGATIVE.     Objective:   Physical Exam  Constitutional: She is oriented to person, place, and time. She appears well-developed and well-nourished. No distress.  HENT:  Head: Normocephalic and atraumatic.  Mouth/Throat: Oropharynx is clear and moist. No oropharyngeal exudate.  Eyes: Pupils are equal, round,  and reactive to light. No scleral icterus.  Neck: Normal range of motion. Neck supple.  Cardiovascular: Normal rate, regular rhythm and normal heart sounds.   Pulmonary/Chest: Effort normal and breath sounds normal. No respiratory distress.  Abdominal: Soft. Bowel sounds are normal. She exhibits no distension. There is no tenderness.  UNABLE TO APPRECIATE BULGE AT UMBILICUS  Musculoskeletal: She exhibits no edema.  Lymphadenopathy:    She has no cervical adenopathy.  Neurological: She is alert and oriented to person, place, and time.  Psychiatric: She has a normal mood and affect.  Vitals reviewed.     Assessment & Plan:

## 2016-05-31 NOTE — Assessment & Plan Note (Addendum)
MOST LIKELY DUE TO SMALL UMBILICAL HERNIA AND/OR IBS ASSOCIATED WITH PSYCHOSOCIAL STRESSORS, LESS LIKELY C DIFF.Marland Kitchen  SUBMIT STOOL FOR C DIFF DRINK WATER TO KEEP YOUR URINE LIGHT YELLOW. TAKE DICYCLOMINE 30 MINUTES PRIOR TO BREAKFAST AND LUNCH EVERY DAY OR EVERY OTHER DAY TO REDUCE ABDOMINAL PAIN AND LOOSE STOOLS. IT MAY CAUSE DROWSINESS, DRY EYES/MOUTH, BLURRY VISION, CONSTIPATION, OR DIFFICULTY URINATING. CALL IN 2 WEEKS IF SYMPTOMS ARE NOT BETTER.  FOLLOW UP IN 4 MOS.

## 2016-05-31 NOTE — Assessment & Plan Note (Signed)
AUG 2017: NL CBC NO BRBPR OR MELENA.

## 2016-05-31 NOTE — Assessment & Plan Note (Signed)
AUG 2017 NL HFP

## 2016-05-31 NOTE — Assessment & Plan Note (Signed)
SYMPTOMS CONTROLLED/RESOLVED.  CONTINUE TO MONITOR SYMPTOMS. 

## 2016-05-31 NOTE — Progress Notes (Signed)
CC'ED TO PCP 

## 2016-05-31 NOTE — Addendum Note (Signed)
Addended by: Danie Binder on: 05/31/2016 11:06 AM   Modules accepted: Orders

## 2016-05-31 NOTE — Progress Notes (Signed)
ON RECALL  °

## 2016-06-04 DIAGNOSIS — R1033 Periumbilical pain: Secondary | ICD-10-CM | POA: Diagnosis not present

## 2016-06-05 ENCOUNTER — Telehealth: Payer: Self-pay | Admitting: Gastroenterology

## 2016-06-05 LAB — FECAL LACTOFERRIN, QUANT: Lactoferrin: NEGATIVE

## 2016-06-05 LAB — CLOSTRIDIUM DIFFICILE BY PCR: CDIFFPCR: NOT DETECTED

## 2016-06-05 NOTE — Telephone Encounter (Signed)
PT is aware.

## 2016-06-05 NOTE — Telephone Encounter (Signed)
PLEASE CALL PT. HER C DIFF TEST IS NEGATIVE.  

## 2016-06-12 ENCOUNTER — Telehealth: Payer: Self-pay | Admitting: Gastroenterology

## 2016-06-12 DIAGNOSIS — E784 Other hyperlipidemia: Secondary | ICD-10-CM | POA: Diagnosis not present

## 2016-06-12 DIAGNOSIS — I10 Essential (primary) hypertension: Secondary | ICD-10-CM | POA: Diagnosis not present

## 2016-06-12 DIAGNOSIS — E1165 Type 2 diabetes mellitus with hyperglycemia: Secondary | ICD-10-CM | POA: Diagnosis not present

## 2016-06-12 NOTE — Telephone Encounter (Signed)
Please advise 

## 2016-06-12 NOTE — Telephone Encounter (Signed)
[phone]  Patient called and wants something for constipation, she has not been able to go   Please call her

## 2016-06-13 NOTE — Telephone Encounter (Signed)
PT said she has not recently had anything for constipation. She is only having an average of one BM weekly.  Please advise!

## 2016-06-14 NOTE — Telephone Encounter (Signed)
Routing to Walden Field, NP for recommendations since Dr. Oneida Alar is off Today and Friday.

## 2016-06-14 NOTE — Telephone Encounter (Signed)
Pt said the Linzess 290 mcg was too much. Can she try the Linzess 145 mcg?

## 2016-06-14 NOTE — Telephone Encounter (Signed)
Per chart review appears she was previously doing well on Linzess 290 mcg once daily. Have her restart this. If she is out she can pick up samples for a week, call with progress report and can send in new Rx next week if it's effective.

## 2016-06-15 NOTE — Telephone Encounter (Signed)
Yes ok to try Linzess 145 mcg once daily on an empty stomach. Can pick up samples to try first. Call with results to either send in an Rx or pick a different agent/dose.

## 2016-06-18 IMAGING — DX DG ABDOMEN ACUTE W/ 1V CHEST
3 series · 3 of 3 positions shown · non-contrast
Comparison: CT 01/03/2013

CLINICAL DATA: Lower abdominal pain. Nausea. Dysuria. Symptoms
began last night.

EXAM:
DG ABDOMEN ACUTE W/ 1V CHEST

[chest pa]
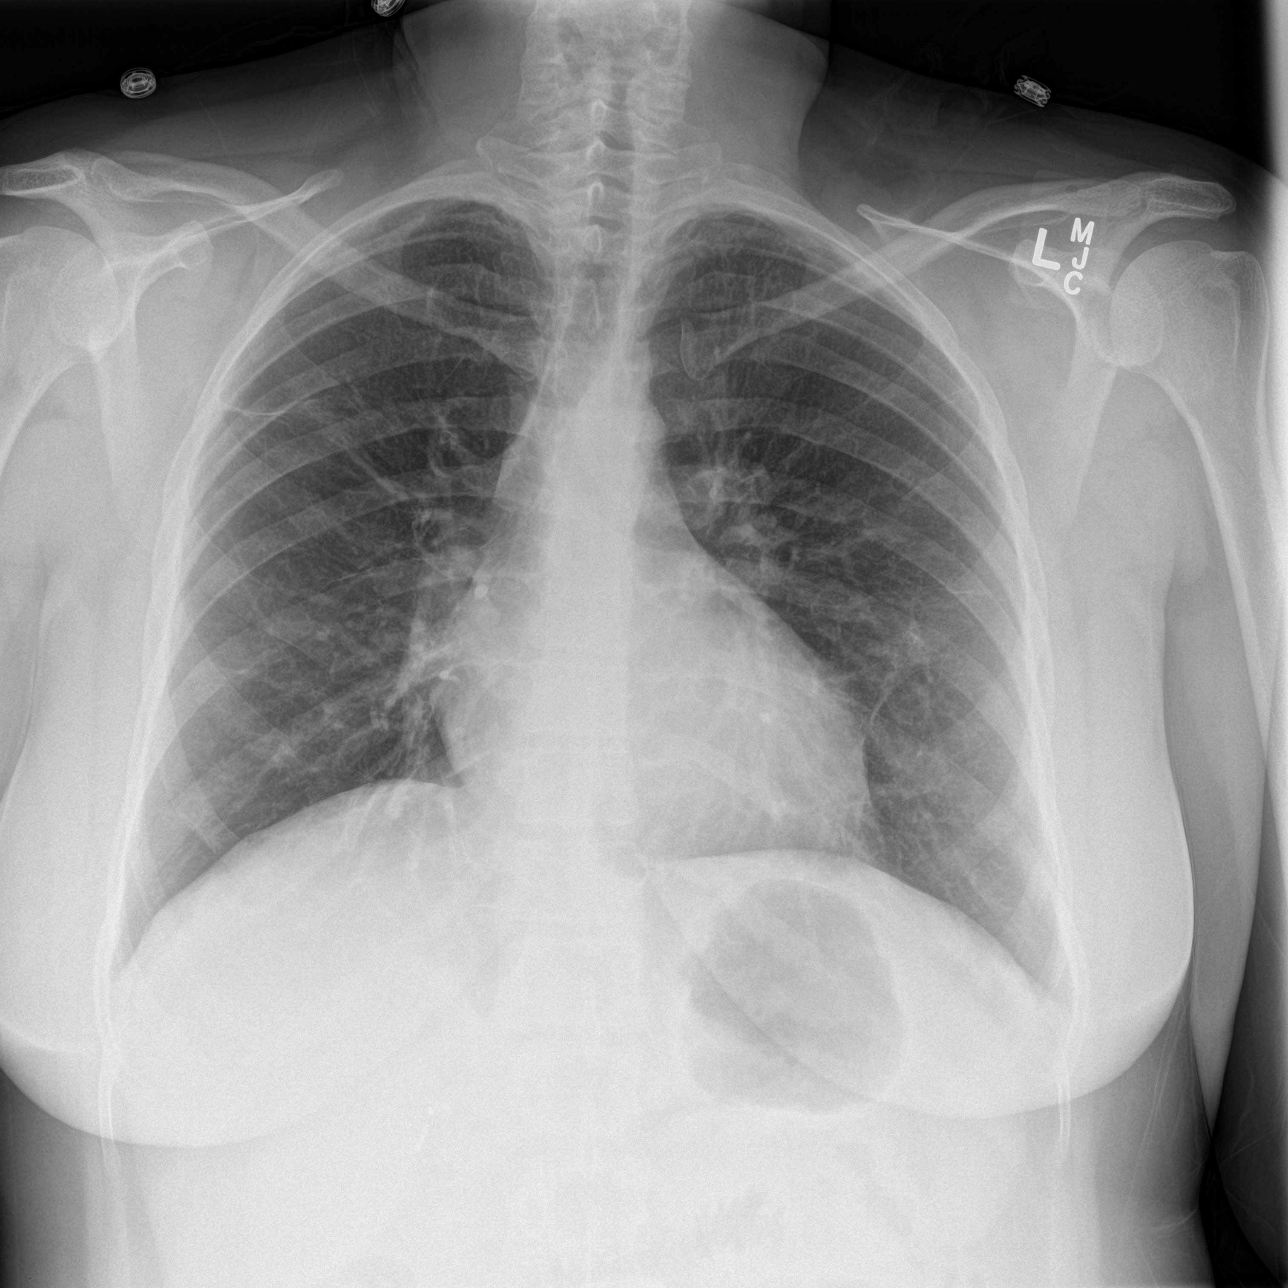

[abdomen erect]
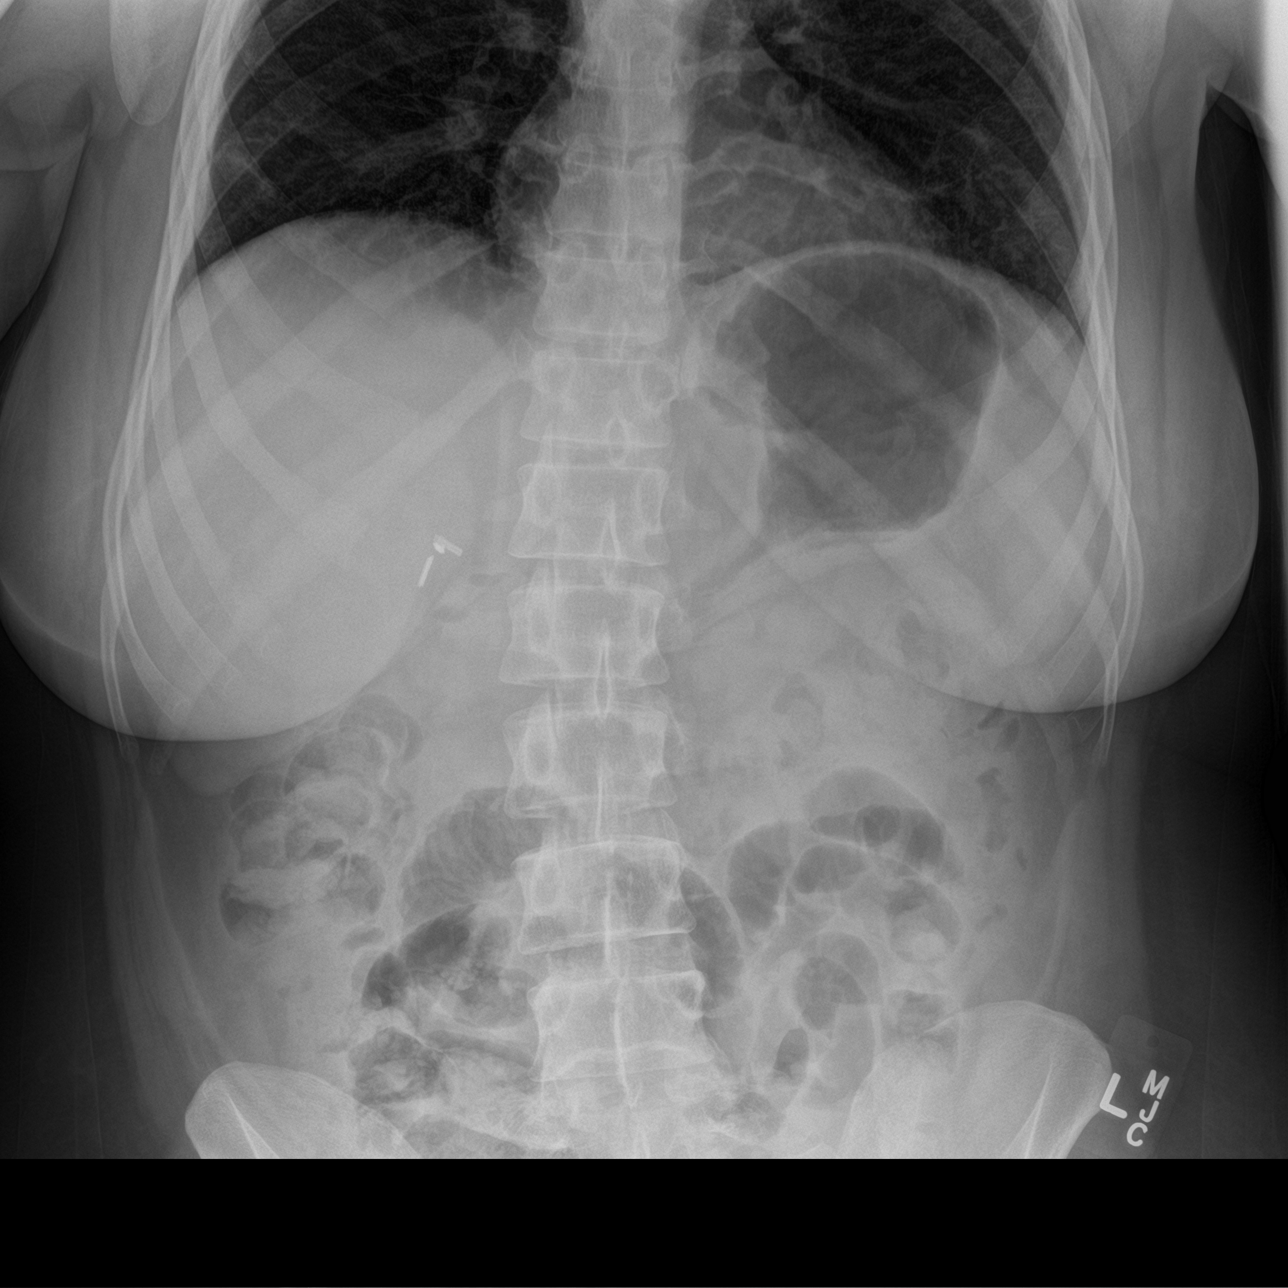

[abdomen supine]
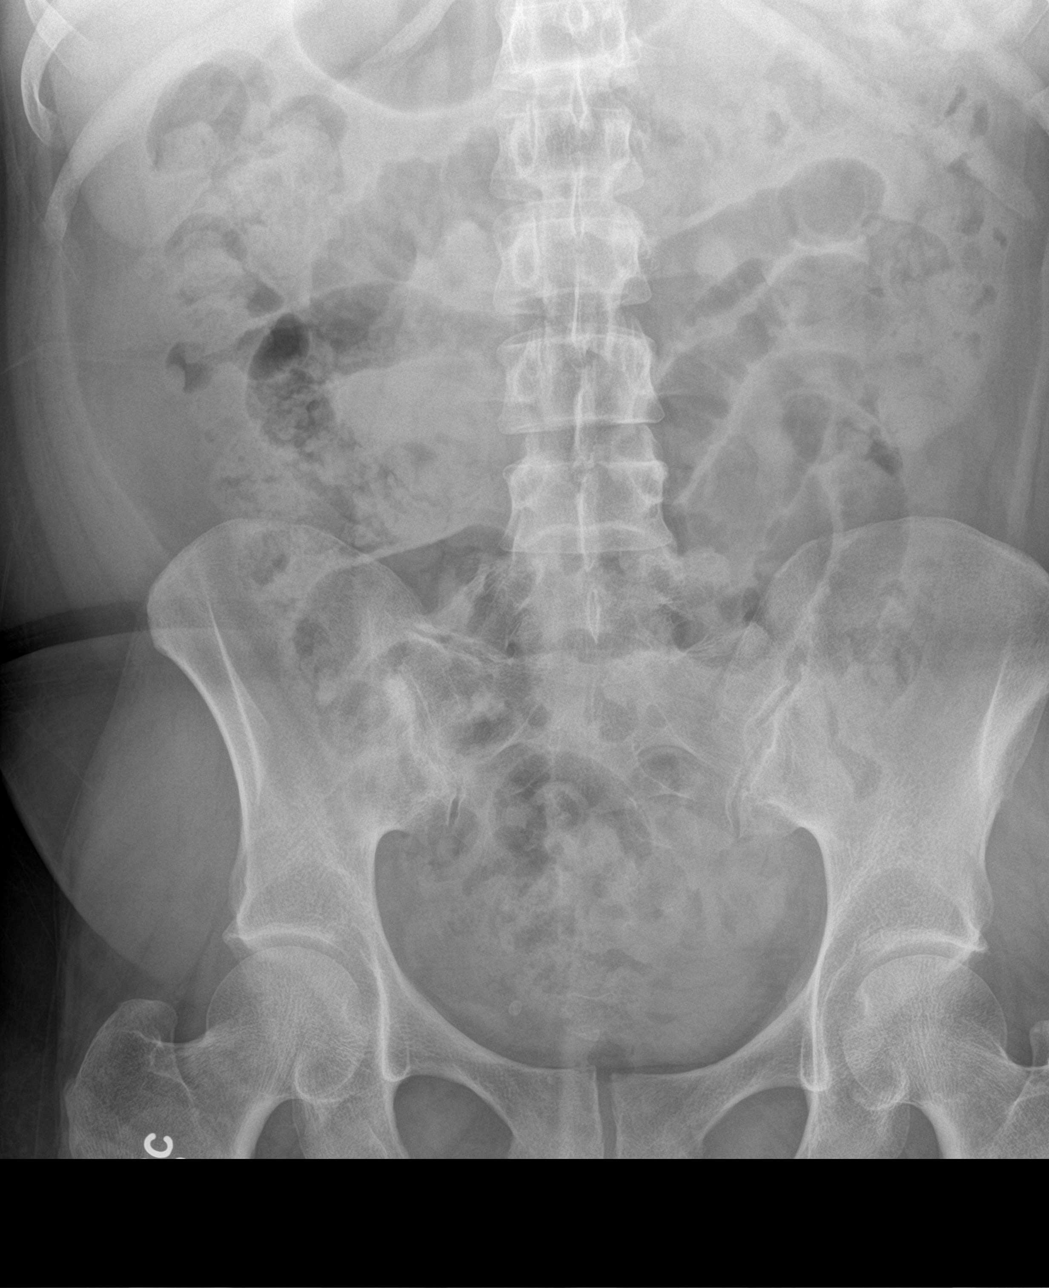

[3 of 3 positions shown; findings below may reference images not displayed]

FINDINGS: There is mild dilatation of mid abdominal small bowel. There is no
extraluminal air. There is a generous volume of stool and air
throughout the colon to the rectum. There is no colonic distention.

Upright view of the chest demonstrates unchanged linear scarring
bilaterally but no acute cardiopulmonary findings.
IMPRESSION: Abnormal gas pattern with mild small bowel dilatation. Partial or
early obstruction cannot be excluded.

## 2016-06-18 NOTE — Telephone Encounter (Signed)
PT is aware samples are at front for pick up #8 of the Linzess 145 mcg.

## 2016-06-30 DIAGNOSIS — E119 Type 2 diabetes mellitus without complications: Secondary | ICD-10-CM | POA: Diagnosis not present

## 2016-07-25 DIAGNOSIS — Z7984 Long term (current) use of oral hypoglycemic drugs: Secondary | ICD-10-CM | POA: Diagnosis not present

## 2016-07-25 DIAGNOSIS — E669 Obesity, unspecified: Secondary | ICD-10-CM | POA: Diagnosis not present

## 2016-07-25 DIAGNOSIS — E7800 Pure hypercholesterolemia, unspecified: Secondary | ICD-10-CM | POA: Diagnosis not present

## 2016-07-25 DIAGNOSIS — I499 Cardiac arrhythmia, unspecified: Secondary | ICD-10-CM | POA: Diagnosis not present

## 2016-07-25 DIAGNOSIS — E1142 Type 2 diabetes mellitus with diabetic polyneuropathy: Secondary | ICD-10-CM | POA: Diagnosis not present

## 2016-07-25 DIAGNOSIS — G4700 Insomnia, unspecified: Secondary | ICD-10-CM | POA: Diagnosis not present

## 2016-07-25 DIAGNOSIS — Z0000 Encounter for general adult medical examination without abnormal findings: Secondary | ICD-10-CM | POA: Diagnosis not present

## 2016-07-25 DIAGNOSIS — Z87891 Personal history of nicotine dependence: Secondary | ICD-10-CM | POA: Diagnosis not present

## 2016-07-25 DIAGNOSIS — Z79899 Other long term (current) drug therapy: Secondary | ICD-10-CM | POA: Diagnosis not present

## 2016-07-25 DIAGNOSIS — I10 Essential (primary) hypertension: Secondary | ICD-10-CM | POA: Diagnosis not present

## 2016-07-25 DIAGNOSIS — J019 Acute sinusitis, unspecified: Secondary | ICD-10-CM | POA: Diagnosis not present

## 2016-07-25 DIAGNOSIS — R69 Illness, unspecified: Secondary | ICD-10-CM | POA: Diagnosis not present

## 2016-07-25 DIAGNOSIS — K219 Gastro-esophageal reflux disease without esophagitis: Secondary | ICD-10-CM | POA: Diagnosis not present

## 2016-08-01 ENCOUNTER — Encounter: Payer: Self-pay | Admitting: Gastroenterology

## 2016-08-14 DIAGNOSIS — E784 Other hyperlipidemia: Secondary | ICD-10-CM | POA: Diagnosis not present

## 2016-08-14 DIAGNOSIS — E1165 Type 2 diabetes mellitus with hyperglycemia: Secondary | ICD-10-CM | POA: Diagnosis not present

## 2016-08-14 DIAGNOSIS — I10 Essential (primary) hypertension: Secondary | ICD-10-CM | POA: Diagnosis not present

## 2016-08-20 DIAGNOSIS — Z6829 Body mass index (BMI) 29.0-29.9, adult: Secondary | ICD-10-CM | POA: Diagnosis not present

## 2016-08-20 DIAGNOSIS — R69 Illness, unspecified: Secondary | ICD-10-CM | POA: Diagnosis not present

## 2016-08-20 DIAGNOSIS — I10 Essential (primary) hypertension: Secondary | ICD-10-CM | POA: Diagnosis not present

## 2016-08-20 DIAGNOSIS — E784 Other hyperlipidemia: Secondary | ICD-10-CM | POA: Diagnosis not present

## 2016-08-20 DIAGNOSIS — E1143 Type 2 diabetes mellitus with diabetic autonomic (poly)neuropathy: Secondary | ICD-10-CM | POA: Diagnosis not present

## 2016-08-20 DIAGNOSIS — M25512 Pain in left shoulder: Secondary | ICD-10-CM | POA: Diagnosis not present

## 2016-08-31 DIAGNOSIS — K449 Diaphragmatic hernia without obstruction or gangrene: Secondary | ICD-10-CM | POA: Diagnosis not present

## 2016-08-31 DIAGNOSIS — Z800 Family history of malignant neoplasm of digestive organs: Secondary | ICD-10-CM | POA: Diagnosis not present

## 2016-08-31 DIAGNOSIS — D869 Sarcoidosis, unspecified: Secondary | ICD-10-CM | POA: Diagnosis not present

## 2016-08-31 DIAGNOSIS — E860 Dehydration: Secondary | ICD-10-CM | POA: Diagnosis not present

## 2016-08-31 DIAGNOSIS — J4540 Moderate persistent asthma, uncomplicated: Secondary | ICD-10-CM | POA: Diagnosis not present

## 2016-08-31 DIAGNOSIS — Z7984 Long term (current) use of oral hypoglycemic drugs: Secondary | ICD-10-CM | POA: Diagnosis not present

## 2016-08-31 DIAGNOSIS — K529 Noninfective gastroenteritis and colitis, unspecified: Secondary | ICD-10-CM | POA: Diagnosis not present

## 2016-08-31 DIAGNOSIS — E785 Hyperlipidemia, unspecified: Secondary | ICD-10-CM | POA: Diagnosis not present

## 2016-08-31 DIAGNOSIS — E784 Other hyperlipidemia: Secondary | ICD-10-CM | POA: Diagnosis not present

## 2016-08-31 DIAGNOSIS — K5289 Other specified noninfective gastroenteritis and colitis: Secondary | ICD-10-CM | POA: Diagnosis not present

## 2016-08-31 DIAGNOSIS — Z9851 Tubal ligation status: Secondary | ICD-10-CM | POA: Diagnosis not present

## 2016-08-31 DIAGNOSIS — Z79899 Other long term (current) drug therapy: Secondary | ICD-10-CM | POA: Diagnosis not present

## 2016-08-31 DIAGNOSIS — E119 Type 2 diabetes mellitus without complications: Secondary | ICD-10-CM | POA: Diagnosis not present

## 2016-09-01 DIAGNOSIS — E860 Dehydration: Secondary | ICD-10-CM | POA: Diagnosis not present

## 2016-09-01 DIAGNOSIS — K449 Diaphragmatic hernia without obstruction or gangrene: Secondary | ICD-10-CM | POA: Diagnosis not present

## 2016-09-01 DIAGNOSIS — E784 Other hyperlipidemia: Secondary | ICD-10-CM | POA: Diagnosis not present

## 2016-09-01 DIAGNOSIS — E119 Type 2 diabetes mellitus without complications: Secondary | ICD-10-CM | POA: Diagnosis not present

## 2016-09-01 DIAGNOSIS — K5289 Other specified noninfective gastroenteritis and colitis: Secondary | ICD-10-CM | POA: Diagnosis not present

## 2016-09-02 DIAGNOSIS — K449 Diaphragmatic hernia without obstruction or gangrene: Secondary | ICD-10-CM | POA: Diagnosis not present

## 2016-09-02 DIAGNOSIS — E860 Dehydration: Secondary | ICD-10-CM | POA: Diagnosis not present

## 2016-09-02 DIAGNOSIS — K5289 Other specified noninfective gastroenteritis and colitis: Secondary | ICD-10-CM | POA: Diagnosis not present

## 2016-09-02 DIAGNOSIS — E784 Other hyperlipidemia: Secondary | ICD-10-CM | POA: Diagnosis not present

## 2016-09-02 DIAGNOSIS — E119 Type 2 diabetes mellitus without complications: Secondary | ICD-10-CM | POA: Diagnosis not present

## 2016-09-03 DIAGNOSIS — E1165 Type 2 diabetes mellitus with hyperglycemia: Secondary | ICD-10-CM | POA: Diagnosis not present

## 2016-09-03 DIAGNOSIS — E784 Other hyperlipidemia: Secondary | ICD-10-CM | POA: Diagnosis not present

## 2016-09-03 DIAGNOSIS — I10 Essential (primary) hypertension: Secondary | ICD-10-CM | POA: Diagnosis not present

## 2016-09-11 DIAGNOSIS — K5289 Other specified noninfective gastroenteritis and colitis: Secondary | ICD-10-CM | POA: Diagnosis not present

## 2016-09-11 DIAGNOSIS — Z6828 Body mass index (BMI) 28.0-28.9, adult: Secondary | ICD-10-CM | POA: Diagnosis not present

## 2016-09-14 DIAGNOSIS — R0789 Other chest pain: Secondary | ICD-10-CM | POA: Diagnosis not present

## 2016-09-14 DIAGNOSIS — R69 Illness, unspecified: Secondary | ICD-10-CM | POA: Diagnosis not present

## 2016-09-14 DIAGNOSIS — Z79899 Other long term (current) drug therapy: Secondary | ICD-10-CM | POA: Diagnosis not present

## 2016-09-14 DIAGNOSIS — I10 Essential (primary) hypertension: Secondary | ICD-10-CM | POA: Diagnosis not present

## 2016-09-14 DIAGNOSIS — E119 Type 2 diabetes mellitus without complications: Secondary | ICD-10-CM | POA: Diagnosis not present

## 2016-09-14 DIAGNOSIS — J029 Acute pharyngitis, unspecified: Secondary | ICD-10-CM | POA: Diagnosis not present

## 2016-09-14 DIAGNOSIS — J45909 Unspecified asthma, uncomplicated: Secondary | ICD-10-CM | POA: Diagnosis not present

## 2016-09-14 DIAGNOSIS — E7800 Pure hypercholesterolemia, unspecified: Secondary | ICD-10-CM | POA: Diagnosis not present

## 2016-09-14 DIAGNOSIS — Z7984 Long term (current) use of oral hypoglycemic drugs: Secondary | ICD-10-CM | POA: Diagnosis not present

## 2016-09-14 DIAGNOSIS — R05 Cough: Secondary | ICD-10-CM | POA: Diagnosis not present

## 2016-09-17 DIAGNOSIS — R69 Illness, unspecified: Secondary | ICD-10-CM | POA: Diagnosis not present

## 2016-09-27 DIAGNOSIS — J411 Mucopurulent chronic bronchitis: Secondary | ICD-10-CM | POA: Diagnosis not present

## 2016-09-27 DIAGNOSIS — E119 Type 2 diabetes mellitus without complications: Secondary | ICD-10-CM | POA: Diagnosis not present

## 2016-09-27 DIAGNOSIS — D869 Sarcoidosis, unspecified: Secondary | ICD-10-CM | POA: Diagnosis not present

## 2016-09-29 DIAGNOSIS — E119 Type 2 diabetes mellitus without complications: Secondary | ICD-10-CM | POA: Diagnosis not present

## 2016-10-05 DIAGNOSIS — H04123 Dry eye syndrome of bilateral lacrimal glands: Secondary | ICD-10-CM | POA: Diagnosis not present

## 2016-10-05 DIAGNOSIS — E119 Type 2 diabetes mellitus without complications: Secondary | ICD-10-CM | POA: Diagnosis not present

## 2016-10-05 DIAGNOSIS — H2513 Age-related nuclear cataract, bilateral: Secondary | ICD-10-CM | POA: Diagnosis not present

## 2016-10-18 ENCOUNTER — Encounter: Payer: Self-pay | Admitting: Gastroenterology

## 2016-10-18 ENCOUNTER — Ambulatory Visit (INDEPENDENT_AMBULATORY_CARE_PROVIDER_SITE_OTHER): Payer: Medicare HMO | Admitting: Gastroenterology

## 2016-10-18 DIAGNOSIS — B9681 Helicobacter pylori [H. pylori] as the cause of diseases classified elsewhere: Secondary | ICD-10-CM

## 2016-10-18 DIAGNOSIS — K5901 Slow transit constipation: Secondary | ICD-10-CM | POA: Diagnosis not present

## 2016-10-18 DIAGNOSIS — K2970 Gastritis, unspecified, without bleeding: Secondary | ICD-10-CM

## 2016-10-18 DIAGNOSIS — K219 Gastro-esophageal reflux disease without esophagitis: Secondary | ICD-10-CM | POA: Diagnosis not present

## 2016-10-18 DIAGNOSIS — K648 Other hemorrhoids: Secondary | ICD-10-CM | POA: Diagnosis not present

## 2016-10-18 NOTE — Assessment & Plan Note (Signed)
SYMPTOMS CONTROLLED/RESOLVED.  CONTINUE TO MONITOR SYMPTOMS. 

## 2016-10-18 NOTE — Patient Instructions (Signed)
USE BENTYL IF NEEDED.  COMPLETE BREATH TEST TO MAKE SURE H PYLORI IS GONE.  FOLLOW UP IN 6 MOS.

## 2016-10-18 NOTE — Progress Notes (Signed)
cc'ed to pcp °

## 2016-10-18 NOTE — Progress Notes (Signed)
Subjective:    Patient ID: [name], female    DOB: Aug 28, 1965, 51 y.o.   MRN: [id number]   [name], MD  HPI No rectal bleeding. Heartburn controlled. BMs: daily(#6, twice daily). SICK A MO OR TWO AGO: DIARRHEA/THROWING UP AND LASTED FOR 1 WEEK. TREATED FOR H PYLORI IN 2017.  PT DENIES FEVER, CHILLS, HEMATEMESIS, nausea, vomiting, melena, CHEST PAIN, SHORTNESS OF BREATH,  CHANGE IN BOWEL IN HABITS, constipation, abdominal pain, problems swallowing, OR heartburn or indigestion.  Past Medical History:  Diagnosis Date  . Abnormal uterine bleeding (AUB) 11/26/2012  . 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 Asthma   . BV (bacterial vaginosis) 11/26/2012  . Constipation   . Diabetes mellitus   . Fatigue 12/24/2012  . Fibroids 01/01/2013  . GERD (gastroesophageal reflux disease)   . Helicobacter pylori gastritis JAN 2017   EGD Bx  . Hematuria 04/05/2015  . Hemorrhoid 01/01/2013  . Hemorrhoids 04/05/2015  . Hypertension   . IBS (irritable bowel syndrome)   . Neuropathy due to secondary diabetes ([place])   . Other and unspecified ovarian cyst 01/01/2013  . Sarcoidosis ([place])   . Vaginal discharge 02/12/2013   +yeast  . Vaginal itching 02/14/2015  . Vaginal odor 04/05/2015    Past Surgical History:  Procedure Laterality Date  . BACTERIAL OVERGROWTH TEST N/A 10/21/2015   Procedure: BACTERIAL OVERGROWTH TEST;  Surgeon: Danie Binder, MD;  [facility];  Service: Endoscopy;  Laterality: N/A;  0800  . CHOLECYSTECTOMY    . COLONOSCOPY N/A 08/20/2013   Dr. Oneida Alar: normal mucosa in terminal ileum, mild diverticulosis in ascending colon, moderate sized hemorrhoids s/p banding  . ENDOMETRIAL ABLATION    . ESOPHAGOGASTRODUODENOSCOPY N/A 04/13/2015   SLF: 1. FEDA due ot erosive gastriitis/ rectal bleeding   . FLEXIBLE SIGMOIDOSCOPY N/A 04/13/2015   SLF: 1. Rectal bleeding due to moderate sized internal hemorrhoids  . HEMORRHOID BANDING  08/20/2013   Procedure: HEMORRHOID  BANDING;  Surgeon: Danie Binder, MD;  [facility];  Service: Endoscopy;;  . HEMORRHOID BANDING  04/13/2015   Procedure: Thayer Jew;  Surgeon: Danie Binder, MD;  [facility];  Service: Endoscopy;;  . HEMORRHOID SURGERY     banding  . HEMORRHOID SURGERY N/A 12/25/2013   Procedure: EXTENSIVE HEMORRHOIDECTOMY;  Surgeon: Jamesetta So, MD;  Location: [facility];  Service: General;  Laterality: N/A;  . SMART PILL PROCEDURE N/A 09/19/2015   Procedure: SMART PILL PROCEDURE;  Surgeon: Danie Binder, MD;  [facility];  Service: Endoscopy;  Laterality: N/A;  0800  . TUBAL LIGATION      Allergies  Allergen Reactions  . Shellfish Allergy Anaphylaxis  . Other Itching    pinapple   Current Outpatient Prescriptions  Medication Sig Dispense Refill  . atorvastatin (LIPITOR) 20 MG tablet Take 20 mg by mouth at bedtime.     . benazepril (LOTENSIN) 10 MG tablet Take 10 mg by mouth daily.     Marland Kitchen dicyclomine (BENTYL) 10 MG capsule 1 PO 30 MINUTES PRIOR TO BREAKFAST AND LUNCH  PRN   . fenofibrate (TRICOR) 145 MG tablet Take 145 mg by mouth at bedtime.     Marland Kitchen ibuprofen (ADVIL,MOTRIN) 800 MG tablet Take 1 tablet (800 mg total) by mouth every 8 (eight) hours as needed.    . metFORMIN (GLUCOPHAGE) 1000 MG tablet Take 1,000 mg by mouth 2 (two) times daily with a meal.     . omeprazole (PRILOSEC)  20 MG capsule BID    .       Review of Systems PER HPI OTHERWISE ALL SYSTEMS ARE NEGATIVE.    Objective:   Physical Exam  Constitutional: She is oriented to person, place, and time. She appears well-developed and well-nourished. No distress.  HENT:  Head: Normocephalic and atraumatic.  Mouth/Throat: Oropharynx is clear and moist. No oropharyngeal exudate.  Eyes: Pupils are equal, round, and reactive to light. No scleral icterus.  Neck: Normal range of motion. Neck supple.  Cardiovascular: Normal rate, regular rhythm and normal heart sounds.   Pulmonary/Chest: Effort normal  and breath sounds normal. No respiratory distress.  Abdominal: Soft. Bowel sounds are normal. She exhibits no distension. There is no tenderness.  Musculoskeletal: She exhibits no edema.  Lymphadenopathy:    She has no cervical adenopathy.  Neurological: She is alert and oriented to person, place, and time.  NO  NEW FOCAL DEFICITS  Psychiatric: She has a normal mood and affect.  Vitals reviewed.      Assessment & Plan:

## 2016-10-18 NOTE — Progress Notes (Signed)
ON RECALL  °

## 2016-10-18 NOTE — Assessment & Plan Note (Signed)
SYMPTOMS CONTROLLED/RESOLVED.  CONTINUE TO MONITOR SYMPTOMS. Bentyl if needed. CALL WITH QUESTIONS OR CONCERNS. FOLLOW UP IN 6 MOS.

## 2016-10-18 NOTE — Assessment & Plan Note (Signed)
Completed treatment.  NEEDS CONFIRMATION OF ERADICATION. HOLD PPI FOR 2 WEEKS. CHECK H PYLORI BREATH TEST. FOLLOW UP IN 6 MOS.

## 2016-10-24 DIAGNOSIS — I10 Essential (primary) hypertension: Secondary | ICD-10-CM | POA: Diagnosis not present

## 2016-10-24 DIAGNOSIS — E784 Other hyperlipidemia: Secondary | ICD-10-CM | POA: Diagnosis not present

## 2016-10-24 DIAGNOSIS — E1165 Type 2 diabetes mellitus with hyperglycemia: Secondary | ICD-10-CM | POA: Diagnosis not present

## 2016-10-29 DIAGNOSIS — H5213 Myopia, bilateral: Secondary | ICD-10-CM | POA: Diagnosis not present

## 2016-10-29 DIAGNOSIS — E1336 Other specified diabetes mellitus with diabetic cataract: Secondary | ICD-10-CM | POA: Diagnosis not present

## 2016-10-29 DIAGNOSIS — Z0100 Encounter for examination of eyes and vision without abnormal findings: Secondary | ICD-10-CM | POA: Diagnosis not present

## 2016-10-29 DIAGNOSIS — H35039 Hypertensive retinopathy, unspecified eye: Secondary | ICD-10-CM | POA: Diagnosis not present

## 2016-11-07 DIAGNOSIS — B9681 Helicobacter pylori [H. pylori] as the cause of diseases classified elsewhere: Secondary | ICD-10-CM | POA: Diagnosis not present

## 2016-11-07 DIAGNOSIS — K2970 Gastritis, unspecified, without bleeding: Secondary | ICD-10-CM | POA: Diagnosis not present

## 2016-11-08 LAB — H. PYLORI BREATH TEST: H. pylori Breath Test: DETECTED — AB

## 2016-11-08 NOTE — Progress Notes (Signed)
[name], could you review these results in Dr. Oneida Alar' absence.

## 2016-11-12 ENCOUNTER — Telehealth: Payer: Self-pay | Admitting: Gastroenterology

## 2016-11-12 MED ORDER — BIS SUBCIT-METRONID-TETRACYC 140-125-125 MG PO CAPS: 3.0000 | ORAL_CAPSULE | Freq: Three times a day (TID) | ORAL | 0 refills | Status: DC

## 2016-11-12 NOTE — Telephone Encounter (Signed)
PT is aware and aware Almyra Free is working on [place].

## 2016-11-12 NOTE — Telephone Encounter (Signed)
LMOM to call.

## 2016-11-12 NOTE — Telephone Encounter (Signed)
REVIEWED-NO ADDITIONAL RECOMMENDATIONS. 

## 2016-11-12 NOTE — Telephone Encounter (Signed)
[phone]  Patient called inquiring about the breath test she had last week

## 2016-11-12 NOTE — Telephone Encounter (Signed)
-----   Message from Everardo All, LPN sent at 06/03/5823  4:31 PM EDT ----- Vicente Males, could you review these results in Dr. Oneida Alar' absence.

## 2016-11-12 NOTE — Telephone Encounter (Signed)
See separate note. Pt is aware of results and plan.

## 2016-11-12 NOTE — Telephone Encounter (Signed)
H.pylori breath test positive. Treated in 2017 with Prevpac.   I have sent in Pylera. Needs to take Prilosec BID with this.

## 2016-11-13 DIAGNOSIS — R69 Illness, unspecified: Secondary | ICD-10-CM | POA: Diagnosis not present

## 2016-11-13 NOTE — Telephone Encounter (Signed)
Let's also make sure she returns in 3 months. Reason: H.pylori.

## 2016-11-13 NOTE — Telephone Encounter (Signed)
Forwarding to [place] to schedule.

## 2016-11-15 ENCOUNTER — Other Ambulatory Visit: Payer: Self-pay | Admitting: Adult Health

## 2016-11-15 DIAGNOSIS — Z1231 Encounter for screening mammogram for malignant neoplasm of breast: Secondary | ICD-10-CM

## 2016-11-16 ENCOUNTER — Telehealth: Payer: Self-pay

## 2016-11-16 NOTE — Telephone Encounter (Signed)
Likely antibiotic-associated. Start taking a probiotic over the counter like Align, Restora, [employer], [employer] colon health. Call if diarrhea persists or worsens. How much longer till she completes?

## 2016-11-16 NOTE — Telephone Encounter (Signed)
Called and informed pt of AB's recommendation to take a probiotic. She started Pylera 11/13/16 and it's a 10 day course.

## 2016-11-16 NOTE — Telephone Encounter (Signed)
Pt called office and is concerned about the Pylera she was started on for H.Pylori. She is having diarrhea every time she goes to the bathroom to urinate. Routing to AB in Dr. Oneida Alar absence.

## 2016-11-19 ENCOUNTER — Other Ambulatory Visit: Payer: Self-pay | Admitting: Adult Health

## 2016-11-20 ENCOUNTER — Telehealth: Payer: Self-pay | Admitting: Gastroenterology

## 2016-11-20 NOTE — Telephone Encounter (Signed)
[place] SHE IS NOT HAVING ANY LUCK WITH THE MEDICATION SHE IS TAKING AND NEEDS ADVICE

## 2016-11-21 NOTE — Telephone Encounter (Signed)
Pt said she has 2 or 3 days left to do the Pylera. She is having to take some of her nausea medicine. She did not take probiotics as she was instructed to do. I told her I can leave her a box of Restora at the front. She will come by and pick up. I spoke to Roseanne Kaufman, NP and she said to COMPLETE the pylera. Pt is aware and said she will do so.

## 2016-11-22 DIAGNOSIS — R69 Illness, unspecified: Secondary | ICD-10-CM | POA: Diagnosis not present

## 2016-11-26 ENCOUNTER — Other Ambulatory Visit: Payer: Self-pay

## 2016-11-26 ENCOUNTER — Telehealth: Payer: Self-pay | Admitting: Gastroenterology

## 2016-11-26 DIAGNOSIS — R197 Diarrhea, unspecified: Secondary | ICD-10-CM

## 2016-11-26 NOTE — Telephone Encounter (Signed)
Pt is aware. Stool container and order at front for pick up. Rx for Diflucan called to [facility] [place] to [place].

## 2016-11-26 NOTE — Telephone Encounter (Signed)
[phone] PATIENT CALLED AND STATED THAT SHE TOOK THE ANTIBIOTIC AND SHE BELIEVES IT HAS GIVEN HER A YEAST INFECTION.  PLEASE GIVE HER A CALL WHEN YOU GET A CHANCE.

## 2016-11-26 NOTE — Telephone Encounter (Signed)
PT said she completed the Pylera, but she is unable to eat without having diarrhea. Everything goes straight through her, so she hardly eats more than once a day. She is feeling very weak and her stools are almost black, but she just has them after eating now.  She is also having rectal and vaginal itching and feels that her hemorrhoids have been affected by so much diarrhea. She is feeling really bad, she states. Dr. Oneida Alar, please advise!

## 2016-11-26 NOTE — Telephone Encounter (Signed)
PLEASE CALL PT. SHE SHOULD SUBMIT STOOL FOR C DIFF PCR. USE KAOPECTATE IF NEEDED TO SLOW DOWN DIARRHEA. USE PREPARATION H FOUR TIMES  A DAY FOR 7 DAYS TO RELIEVE RECTAL PAIN/PRESSURE/BLEEDING. CALL IN DIFLUCAN 150 MG PO x1 FOR YEAST INFECTION.

## 2016-11-27 DIAGNOSIS — R69 Illness, unspecified: Secondary | ICD-10-CM | POA: Diagnosis not present

## 2016-11-28 ENCOUNTER — Telehealth: Payer: Self-pay | Admitting: Gastroenterology

## 2016-11-28 MED ORDER — FLUCONAZOLE 150 MG PO TABS: ORAL_TABLET | ORAL | 0 refills | Status: DC

## 2016-11-28 NOTE — Telephone Encounter (Signed)
PT said she feels like she needs one more of the Diflucan, that usually she has to have 2 for her symptoms to subside.  She is still having some vaginal itching, but also some burning with urination.  Dr. Oneida Alar, please advise!

## 2016-11-28 NOTE — Telephone Encounter (Signed)
Patient asked for DS to call her at [phone] regarding her medicine

## 2016-11-28 NOTE — Telephone Encounter (Signed)
See addendum to note of 11/26/2016.

## 2016-11-28 NOTE — Addendum Note (Signed)
Addended by: Danie Binder on: 11/28/2016 05:44 PM   Modules accepted: Orders

## 2016-11-28 NOTE — Telephone Encounter (Signed)
PLEASE CALL PT. If she has vaginitis she will have burning with urination. RX FOR ADDITIONAL DIFLUCAN SENT.

## 2016-11-29 ENCOUNTER — Telehealth: Payer: Self-pay

## 2016-11-29 NOTE — Telephone Encounter (Signed)
T/C from [facility] in [place], with questions about the Diflucan order for pt sent in yesterday.  I confirmed with Dr. Oneida Alar it should only be one tablet to take now. Shirlean Mylar is aware.

## 2016-11-29 NOTE — Telephone Encounter (Signed)
REVIEWED. AGREE. NO ADDITIONAL RECOMMENDATIONS. 

## 2016-11-29 NOTE — Telephone Encounter (Signed)
Pt is aware.  

## 2016-12-04 DIAGNOSIS — R69 Illness, unspecified: Secondary | ICD-10-CM | POA: Diagnosis not present

## 2016-12-05 DIAGNOSIS — E119 Type 2 diabetes mellitus without complications: Secondary | ICD-10-CM | POA: Diagnosis not present

## 2016-12-05 DIAGNOSIS — I10 Essential (primary) hypertension: Secondary | ICD-10-CM | POA: Diagnosis not present

## 2016-12-05 DIAGNOSIS — E784 Other hyperlipidemia: Secondary | ICD-10-CM | POA: Diagnosis not present

## 2016-12-05 DIAGNOSIS — Z6827 Body mass index (BMI) 27.0-27.9, adult: Secondary | ICD-10-CM | POA: Diagnosis not present

## 2016-12-06 DIAGNOSIS — E1143 Type 2 diabetes mellitus with diabetic autonomic (poly)neuropathy: Secondary | ICD-10-CM | POA: Diagnosis not present

## 2016-12-11 DIAGNOSIS — R69 Illness, unspecified: Secondary | ICD-10-CM | POA: Diagnosis not present

## 2016-12-18 DIAGNOSIS — E1165 Type 2 diabetes mellitus with hyperglycemia: Secondary | ICD-10-CM | POA: Diagnosis not present

## 2016-12-18 DIAGNOSIS — I10 Essential (primary) hypertension: Secondary | ICD-10-CM | POA: Diagnosis not present

## 2016-12-18 DIAGNOSIS — R69 Illness, unspecified: Secondary | ICD-10-CM | POA: Diagnosis not present

## 2016-12-18 DIAGNOSIS — E784 Other hyperlipidemia: Secondary | ICD-10-CM | POA: Diagnosis not present

## 2016-12-20 ENCOUNTER — Ambulatory Visit (HOSPITAL_COMMUNITY): Payer: Medicare HMO

## 2016-12-20 ENCOUNTER — Ambulatory Visit (HOSPITAL_COMMUNITY)
Admission: RE | Admit: 2016-12-20 | Discharge: 2016-12-20 | Disposition: A | Discharge status: Home / Self Care | Payer: Medicare HMO | Source: Ambulatory Visit | Attending: Adult Health | Admitting: Adult Health

## 2016-12-20 DIAGNOSIS — Z1231 Encounter for screening mammogram for malignant neoplasm of breast: Secondary | ICD-10-CM | POA: Diagnosis not present

## 2016-12-25 DIAGNOSIS — R69 Illness, unspecified: Secondary | ICD-10-CM | POA: Diagnosis not present

## 2016-12-27 DIAGNOSIS — E119 Type 2 diabetes mellitus without complications: Secondary | ICD-10-CM | POA: Diagnosis not present

## 2016-12-29 DIAGNOSIS — E119 Type 2 diabetes mellitus without complications: Secondary | ICD-10-CM | POA: Diagnosis not present

## 2017-01-01 DIAGNOSIS — R69 Illness, unspecified: Secondary | ICD-10-CM | POA: Diagnosis not present

## 2017-01-02 ENCOUNTER — Encounter: Payer: Self-pay | Admitting: Gastroenterology

## 2017-01-03 DIAGNOSIS — R69 Illness, unspecified: Secondary | ICD-10-CM | POA: Diagnosis not present

## 2017-01-08 DIAGNOSIS — R69 Illness, unspecified: Secondary | ICD-10-CM | POA: Diagnosis not present

## 2017-01-22 DIAGNOSIS — R69 Illness, unspecified: Secondary | ICD-10-CM | POA: Diagnosis not present

## 2017-01-29 DIAGNOSIS — R69 Illness, unspecified: Secondary | ICD-10-CM | POA: Diagnosis not present

## 2017-02-14 ENCOUNTER — Encounter: Payer: Self-pay | Admitting: Gastroenterology

## 2017-02-14 ENCOUNTER — Other Ambulatory Visit: Payer: Self-pay | Admitting: *Deleted

## 2017-02-14 ENCOUNTER — Ambulatory Visit (INDEPENDENT_AMBULATORY_CARE_PROVIDER_SITE_OTHER): Payer: Medicare HMO | Admitting: Gastroenterology

## 2017-02-14 DIAGNOSIS — K219 Gastro-esophageal reflux disease without esophagitis: Secondary | ICD-10-CM

## 2017-02-14 DIAGNOSIS — K644 Residual hemorrhoidal skin tags: Secondary | ICD-10-CM

## 2017-02-14 DIAGNOSIS — B9681 Helicobacter pylori [H. pylori] as the cause of diseases classified elsewhere: Secondary | ICD-10-CM | POA: Diagnosis not present

## 2017-02-14 DIAGNOSIS — K5901 Slow transit constipation: Secondary | ICD-10-CM

## 2017-02-14 DIAGNOSIS — K2970 Gastritis, unspecified, without bleeding: Secondary | ICD-10-CM | POA: Diagnosis not present

## 2017-02-14 DIAGNOSIS — K648 Other hemorrhoids: Secondary | ICD-10-CM

## 2017-02-14 MED ORDER — LIDOCAINE-HYDROCORTISONE ACE 3-2.5 % RE KIT: PACK | RECTAL | 0 refills | Status: DC

## 2017-02-14 NOTE — Assessment & Plan Note (Signed)
SYMPTOMS FAIRLY WELL CONTROLLED.  CONTINUE OMEPRAZOLE.  TAKE 30 MINUTES PRIOR TO YOUR FIRST MEAL. AVOID TRIGGERS FOLLOW UP IN 6 MOS.

## 2017-02-14 NOTE — Progress Notes (Signed)
cc'ed to pcp °

## 2017-02-14 NOTE — Assessment & Plan Note (Signed)
SYMPTOMS NOT IDEALLY CONTROLLED.  DRINK WATER TO KEEP YOUR URINE LIGHT YELLOW. FOLLOW A HIGH FIBER DIET. SEE INFO BELOW. [place] APOTHECARY CREAM QID FOR 10 DAYS. SEE DR. Arnoldo Morale  TO FIX YOUR HEMORRHOIDS. FOLLOW UP IN 6 MOS.

## 2017-02-14 NOTE — Progress Notes (Signed)
Subjective:    Patient ID: [name], female    DOB: [birth year], 51 y.o.   MRN: [id number]   [name], MD  HPI Hemorrhoids are burning and itching. RODE [place]. TOOK ALL THE PYLERA. DIDN'T HOLD BATHROOM. BMs: DAILY(#4 USUALLY). HEARTBURN: SO/SO. HAS 2 KIDS. NOT INTERESTED IN CREAMS OR SUPPOSITORIES.   PT DENIES FEVER, CHILLS, HEMATOCHEZIA, HEMATEMESIS, nausea, vomiting, melena, diarrhea, CHEST PAIN, SHORTNESS OF BREATH, CHANGE IN BOWEL IN HABITS, constipation, abdominal pain, OR problems swallowing.  Past Medical History:  Diagnosis Date  . Abnormal uterine bleeding (AUB) 11/26/2012  . 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 Asthma   . BV (bacterial vaginosis) 11/26/2012  . Constipation   . Diabetes mellitus   . Fatigue 12/24/2012  . Fibroids 01/01/2013  . GERD (gastroesophageal reflux disease)   . Helicobacter pylori gastritis JAN 2017   EGD Bx  . Hematuria 04/05/2015  . Hemorrhoid 01/01/2013  . Hemorrhoids 04/05/2015  . Hypertension   . IBS (irritable bowel syndrome)   . Neuropathy due to secondary diabetes ([place])   . Other and unspecified ovarian cyst 01/01/2013  . Sarcoidosis   . Vaginal discharge 02/12/2013   +yeast  . Vaginal itching 02/14/2015  . Vaginal odor 04/05/2015   Past Surgical History:  Procedure Laterality Date  . BACTERIAL OVERGROWTH TEST N/A 10/21/2015   Procedure: BACTERIAL OVERGROWTH TEST;  Surgeon: Danie Binder, MD;  [facility];  Service: Endoscopy;  Laterality: N/A;  0800  . CHOLECYSTECTOMY    . COLONOSCOPY N/A 08/20/2013   Dr. Oneida Alar: normal mucosa in terminal ileum, mild diverticulosis in ascending colon, moderate sized hemorrhoids s/p banding  . ENDOMETRIAL ABLATION    . ESOPHAGOGASTRODUODENOSCOPY N/A 04/13/2015   SLF: 1. FEDA due ot erosive gastriitis/ rectal bleeding   . FLEXIBLE SIGMOIDOSCOPY N/A 04/13/2015   SLF: 1. Rectal bleeding due to moderate sized internal hemorrhoids  .  HEMORRHOID BANDING  08/20/2013   Procedure: HEMORRHOID BANDING;  Surgeon: Danie Binder, MD;  [facility];  Service: Endoscopy;;  . HEMORRHOID BANDING  04/13/2015   Procedure: Thayer Jew;  Surgeon: Danie Binder, MD;  [facility];  Service: Endoscopy;;  . HEMORRHOID SURGERY     banding  . HEMORRHOID SURGERY N/A 12/25/2013   Procedure: EXTENSIVE HEMORRHOIDECTOMY;  Surgeon: Jamesetta So, MD;  Location: [facility];  Service: General;  Laterality: N/A;  . SMART PILL PROCEDURE N/A 09/19/2015   Procedure: SMART PILL PROCEDURE;  Surgeon: Danie Binder, MD;  [facility];  Service: Endoscopy;  Laterality: N/A;  0800  . TUBAL LIGATION      Allergies  Allergen Reactions  . Shellfish Allergy Anaphylaxis  . Other Itching    pinapple    Current Outpatient Medications  Medication Sig Dispense Refill  . atorvastatin (LIPITOR) 20 MG tablet Take 20 mg by mouth at bedtime.     . benazepril (LOTENSIN) 10 MG tablet Take 10 mg by mouth daily.     .      . dicyclomine (BENTYL) 10 MG capsule  1 PO 30 MINUTES PRIOR TO BREAKFAST AND LUNCH)    . fenofibrate (TRICOR) 145 MG tablet Take 145 mg by mouth at bedtime.     Marland Kitchen ibuprofen (ADVIL,MOTRIN) 800 MG tablet TAKE ONE TABLET BY MOUTH EVERY 8 HOURS AS NEEDED    . metFORMIN (GLUCOPHAGE) 1000 MG tablet Take 1,000 mg by mouth 2 (two) times daily with a meal.     .  omeprazole (PRILOSEC) 20 MG capsule  TAKE ONE ONCE DAILY.     Marland Kitchen LINZESS IF NEEDED    .       Review of Systems  PER HPI OTHERWISE ALL SYSTEMS ARE NEGATIVE.    Objective:   Physical Exam  Constitutional: She is oriented to person, place, and time. She appears well-developed and well-nourished. No distress.  HENT:  Head: Normocephalic and atraumatic.  Mouth/Throat: Oropharynx is clear and moist. No oropharyngeal exudate.  Eyes: Pupils are equal, round, and reactive to light. No scleral icterus.  Neck: Normal range of motion. Neck supple.  Cardiovascular:  Normal rate, regular rhythm and normal heart sounds.  Pulmonary/Chest: Effort normal and breath sounds normal. No respiratory distress.  Abdominal: Soft. Bowel sounds are normal. She exhibits no distension. There is no tenderness.  Musculoskeletal: She exhibits no edema.  Lymphadenopathy:    She has no cervical adenopathy.  Neurological: She is alert and oriented to person, place, and time.  NO FOCAL DEFICITS  Psychiatric: She has a normal mood and affect.  Vitals reviewed.     Assessment & Plan:

## 2017-02-14 NOTE — Assessment & Plan Note (Signed)
SYMPTOMS FAIRLY WELL CONTROLLED.  CONTINUE TO MONITOR SYMPTOMS. 

## 2017-02-14 NOTE — Assessment & Plan Note (Signed)
COMPLETED THERAPY.  RECHECK BREATH TEST IN JAN 2019. HOLD OMPERAZOLE FOR 14 DAYS THEN COMPLETE BREATH TEST.CHILDREN SHOULD BE TESTED FOR H PYLORI.

## 2017-02-14 NOTE — Progress Notes (Signed)
ON RECALL  °

## 2017-02-14 NOTE — Patient Instructions (Addendum)
DRINK WATER TO KEEP YOUR URINE LIGHT YELLOW.  FOLLOW A HIGH FIBER DIET. AVOID ITEMS THAT CAUSE BLOATING & GAS. SEE INFO BELOW.  TO RELIEVE RECTAL PAIN/ITCHING OR BLEEDING USE  APOTHECARY CREAM FOUR TIMES A DAY FOR 10 DAYS.  SEE DR. Arnoldo Morale  TO FIX YOUR HEMORRHOIDS.  RECHECK BREATH TEST IN JAN 2019. HOLD OMPERAZOLE FOR 14 DAYS THEN COMPLETE BREATH TEST.CHILDREN SHOULD BE TESTED FOR H PYLORI.   FOLLOW UP IN 6 MOS.

## 2017-03-05 ENCOUNTER — Encounter: Payer: Self-pay | Admitting: General Surgery

## 2017-03-05 ENCOUNTER — Ambulatory Visit: Payer: Medicare HMO | Admitting: General Surgery

## 2017-03-05 VITALS — BP 136/74 | HR 91 | Temp 97.7°F | Ht 66.0 in | Wt 183.0 lb

## 2017-03-05 DIAGNOSIS — K602 Anal fissure, unspecified: Secondary | ICD-10-CM

## 2017-03-05 NOTE — Progress Notes (Signed)
[name]; [id number]; 09-Jan-1966   HPI Patient is a 51 year old black female who was referred to my care by Dr. Oneida Alar for evaluation and treatment of hemorrhoidal disease.  Patient states that over the past few months, she has had some rectal pain with defecation and the feeling that there is a pressure sensation in her anus.  She has had her gallbladder removed and states she goes to the bathroom frequently.  She denies significant constipation.  She denies any fever or chills.  I performed an extensive hemorrhoidectomy in 2015.  She is also had several endoscopic banding's of internal hemorrhoids in the past.  She has noted some blood on the toilet paper when she wipes herself.  She does not passed clots of blood.  She has a 9 out of 10 pain scale, but she states a lot of her body hurts in general. Past Medical History:  Diagnosis Date  . Abnormal uterine bleeding (AUB) 11/26/2012  . 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 Asthma   . BV (bacterial vaginosis) 11/26/2012  . Constipation   . Diabetes mellitus   . Fatigue 12/24/2012  . Fibroids 01/01/2013  . GERD (gastroesophageal reflux disease)   . Helicobacter pylori gastritis JAN 2017   EGD Bx  . Hematuria 04/05/2015  . Hemorrhoid 01/01/2013  . Hemorrhoids 04/05/2015  . Hypertension   . IBS (irritable bowel syndrome)   . Neuropathy due to secondary diabetes ([place])   . Other and unspecified ovarian cyst 01/01/2013  . Sarcoidosis   . Vaginal discharge 02/12/2013   +yeast  . Vaginal itching 02/14/2015  . Vaginal odor 04/05/2015    Past Surgical History:  Procedure Laterality Date  . BACTERIAL OVERGROWTH TEST N/A 10/21/2015   Procedure: BACTERIAL OVERGROWTH TEST;  Surgeon: Danie Binder, MD;  [facility];  Service: Endoscopy;  Laterality: N/A;  0800  . CHOLECYSTECTOMY    . COLONOSCOPY N/A 08/20/2013   Dr. Oneida Alar: normal mucosa in terminal ileum, mild diverticulosis in ascending colon, moderate sized hemorrhoids s/p banding   . ENDOMETRIAL ABLATION    . ESOPHAGOGASTRODUODENOSCOPY N/A 04/13/2015   SLF: 1. FEDA due ot erosive gastriitis/ rectal bleeding   . FLEXIBLE SIGMOIDOSCOPY N/A 04/13/2015   SLF: 1. Rectal bleeding due to moderate sized internal hemorrhoids  . HEMORRHOID BANDING  08/20/2013   Procedure: HEMORRHOID BANDING;  Surgeon: Danie Binder, MD;  [facility];  Service: Endoscopy;;  . HEMORRHOID BANDING  04/13/2015   Procedure: Thayer Jew;  Surgeon: Danie Binder, MD;  [facility];  Service: Endoscopy;;  . HEMORRHOID SURGERY     banding  . HEMORRHOID SURGERY N/A 12/25/2013   Procedure: EXTENSIVE HEMORRHOIDECTOMY;  Surgeon: Jamesetta So, MD;  Location: [facility];  Service: General;  Laterality: N/A;  . SMART PILL PROCEDURE N/A 09/19/2015   Procedure: SMART PILL PROCEDURE;  Surgeon: Danie Binder, MD;  [facility];  Service: Endoscopy;  Laterality: N/A;  0800  . TUBAL LIGATION      Family History  Problem Relation Age of Onset  . Diabetes Father   . Cancer Mother        throat  . Cancer Brother        lung  . Breast cancer Cousin   . Cancer Maternal Aunt   . Diabetes Maternal Aunt   . Colon cancer Neg Hx     Current Outpatient Medications on File Prior to Visit  Medication Sig Dispense Refill  . atorvastatin (LIPITOR) 20  MG tablet Take 20 mg by mouth at bedtime.     . benazepril (LOTENSIN) 10 MG tablet Take 10 mg by mouth daily.     Marland Kitchen bismuth-metronidazole-tetracycline (PYLERA) 140-125-125 MG capsule Take 3 capsules by mouth 4 (four) times daily -  before meals and at bedtime. (Patient taking differently: Take 1 capsule 4 (four) times daily -  before meals and at bedtime by mouth. ) 120 capsule 0  . cephALEXin (KEFLEX) 500 MG capsule Take 500 mg by mouth 3 (three) times daily.    Marland Kitchen dicyclomine (BENTYL) 10 MG capsule 1 PO 30 MINUTES PRIOR TO BREAKFAST AND LUNCH (Patient taking differently: Take 10 mg by mouth as needed. 1 PO 30 MINUTES PRIOR TO BREAKFAST  AND LUNCH) 62 capsule 11  . fenofibrate (TRICOR) 145 MG tablet Take 145 mg by mouth at bedtime.     . fluconazole (DIFLUCAN) 150 MG tablet 2 PO ON OCT 31 THEN 1 PO DAILY FOR 20 DAYS. (Patient not taking: Reported on 02/14/2017) 1 tablet 0  . ibuprofen (ADVIL,MOTRIN) 800 MG tablet TAKE ONE TABLET BY MOUTH EVERY 8 HOURS AS NEEDED 60 tablet 2  . Lidocaine-Hydrocortisone Ace 3-2.5 % KIT APPLY TO RECTUM QID FOR 2 WEEKS 1 each 0  . metFORMIN (GLUCOPHAGE) 1000 MG tablet Take 1,000 mg by mouth 2 (two) times daily with a meal.     . omeprazole (PRILOSEC) 20 MG capsule TAKE ONE CAPSULE BY MOUTH TWICE DAILY FOR 3 MONTHS, THEN TAKE ONE ONCE DAILY (Patient taking differently: TAKE ONE CAPSULE BY MOUTH TWICE DAILY FOR 3 MONTHS, THEN TAKE ONE ONCE DAILY. Taking twice daily) 60 capsule 5   No current facility-administered medications on file prior to visit.     Allergies  Allergen Reactions  . Shellfish Allergy Anaphylaxis  . Other Itching    pinapple    Social History   Substance and Sexual Activity  Alcohol Use No    Social History   Tobacco Use  Smoking Status Former Smoker  . Packs/day: 1.00  . Years: 6.00  . Pack years: 6.00  . Types: Cigarettes  . Last attempt to quit: 12/22/2003  . Years since quitting: 13.2  Smokeless Tobacco Never Used  Tobacco Comment   Quit  5 years    Review of Systems  Constitutional: Positive for chills.  HENT: Positive for sinus pain.   Eyes: Positive for blurred vision and pain.  Respiratory: Positive for cough and wheezing.   Cardiovascular: Positive for chest pain.  Gastrointestinal: Positive for abdominal pain and nausea.  Genitourinary: Negative.   Musculoskeletal: Positive for joint pain.  Skin: Negative.   Neurological: Positive for headaches.  Endo/Heme/Allergies: Negative.   Psychiatric/Behavioral: Negative.     Objective   Vitals:   03/05/17 1331  BP: 136/74  Pulse: 91  Temp: 97.7 F (36.5 C)    Physical Exam  Constitutional:  She is oriented to person, place, and time and well-developed, well-nourished, and in no distress.  HENT:  Head: Normocephalic and atraumatic.  Cardiovascular: Normal rate, regular rhythm and normal heart sounds. Exam reveals no gallop and no friction rub.  No murmur heard. Pulmonary/Chest: Effort normal and breath sounds normal. No respiratory distress. She has no wheezes. She has no rales.  Genitourinary:  Genitourinary Comments: Rectal examination reveals no external hemorrhoids present.  On digital examination, she had pain along the posterior aspect of the anus.  There were internal hemorrhoids present, but none prolapsed during the exam.  No blood was noted.  A small  posterior tear is suspected.  Neurological: She is alert and oriented to person, place, and time.  Skin: Skin is warm and dry.  Vitals reviewed.  Dr. Josephine Cables notes reviewed Assessment  Anal fissure Plan   No need for acute surgical intervention at this time.  Patient had already been prescribed a rectal cream approximately 1 week ago by Dr. Oneida Alar, but she has not taken the medication as prescribed.  I did tell her that I would apply the cream to the posterior aspect of the anus after bowel movements.  She is fine with that.  She will come back in 1 month should she still be symptomatic.  Follow-up as needed.

## 2017-03-05 NOTE — Patient Instructions (Signed)
Anal Fissure, Adult An anal fissure is a small tear or crack in the skin around the anus. Bleeding from a fissure usually stops on its own within a few minutes. However, bleeding will often occur again with each bowel movement until the crack heals. What are the causes? This condition may be caused by:  Passing large, hard stool (feces).  Frequent diarrhea.  Constipation.  Inflammatory bowel disease (Crohn disease or ulcerative colitis).  Infections.  Anal sex.  What are the signs or symptoms? Symptoms of this condition include:  Bleeding from the rectum.  Small amounts of blood seen on your stool, on toilet paper, or in the toilet after a bowel movement.  Painful bowel movements.  Itching or irritation around the anus.  How is this diagnosed? A health care provider may diagnose this condition by closely examining the anal area. An anal fissure can usually be seen with careful inspection. In some cases, a rectal exam may be performed, or a short tube (anoscope) may be used to examine the anal canal. How is this treated? Treatment for this condition may include:  Taking steps to avoid constipation. This may include making changes to your diet, such as increasing your intake of fiber or fluid.  Taking fiber supplements. These supplements can soften your stool to help make bowel movements easier. Your health care provider may also prescribe a stool softener if your stool is often hard.  Taking sitz baths. This may help to heal the tear.  Using medicated creams or ointments. These may be prescribed to lessen discomfort.  Follow these instructions at home: Eating and drinking  Avoid foods that may be constipating, such as bananas and dairy products.  Drink enough fluid to keep your urine clear or pale yellow.  Maintain a diet that is high in fruits, whole grains, and vegetables. General instructions  Keep the anal area as clean and dry as possible.  Take sitz baths as  told by your health care provider. Do not use soap in the sitz baths.  Take over-the-counter and prescription medicines only as told by your health care provider.  Use creams or ointments only as told by your health care provider.  Keep all follow-up visits as told by your health care provider. This is important. Contact a health care provider if:  You have more bleeding.  You have a fever.  You have diarrhea that is mixed with blood.  You continue to have pain.  Your problem is getting worse rather than better. This information is not intended to replace advice given to you by your health care provider. Make sure you discuss any questions you have with your health care provider. Document Released: 03/19/2005 Document Revised: 07/27/2015 Document Reviewed: 06/14/2014 Elsevier Interactive Patient Education  2018 Elsevier Inc.  

## 2017-03-07 ENCOUNTER — Encounter: Payer: Self-pay | Admitting: Gastroenterology

## 2017-03-12 DIAGNOSIS — E1165 Type 2 diabetes mellitus with hyperglycemia: Secondary | ICD-10-CM | POA: Diagnosis not present

## 2017-03-12 DIAGNOSIS — I10 Essential (primary) hypertension: Secondary | ICD-10-CM | POA: Diagnosis not present

## 2017-03-12 DIAGNOSIS — E7849 Other hyperlipidemia: Secondary | ICD-10-CM | POA: Diagnosis not present

## 2017-03-21 DIAGNOSIS — I10 Essential (primary) hypertension: Secondary | ICD-10-CM | POA: Diagnosis not present

## 2017-03-21 DIAGNOSIS — Z6829 Body mass index (BMI) 29.0-29.9, adult: Secondary | ICD-10-CM | POA: Diagnosis not present

## 2017-03-21 DIAGNOSIS — J411 Mucopurulent chronic bronchitis: Secondary | ICD-10-CM | POA: Diagnosis not present

## 2017-03-21 DIAGNOSIS — E7849 Other hyperlipidemia: Secondary | ICD-10-CM | POA: Diagnosis not present

## 2017-03-21 DIAGNOSIS — K210 Gastro-esophageal reflux disease with esophagitis: Secondary | ICD-10-CM | POA: Diagnosis not present

## 2017-03-21 DIAGNOSIS — E119 Type 2 diabetes mellitus without complications: Secondary | ICD-10-CM | POA: Diagnosis not present

## 2017-03-21 DIAGNOSIS — M7061 Trochanteric bursitis, right hip: Secondary | ICD-10-CM | POA: Diagnosis not present

## 2017-03-21 DIAGNOSIS — D869 Sarcoidosis, unspecified: Secondary | ICD-10-CM | POA: Diagnosis not present

## 2017-03-21 DIAGNOSIS — J301 Allergic rhinitis due to pollen: Secondary | ICD-10-CM | POA: Diagnosis not present

## 2017-03-28 DIAGNOSIS — E119 Type 2 diabetes mellitus without complications: Secondary | ICD-10-CM | POA: Diagnosis not present

## 2017-03-28 DIAGNOSIS — D869 Sarcoidosis, unspecified: Secondary | ICD-10-CM | POA: Diagnosis not present

## 2017-03-28 DIAGNOSIS — D860 Sarcoidosis of lung: Secondary | ICD-10-CM | POA: Diagnosis not present

## 2017-03-30 DIAGNOSIS — E119 Type 2 diabetes mellitus without complications: Secondary | ICD-10-CM | POA: Diagnosis not present

## 2017-04-04 ENCOUNTER — Encounter: Payer: Self-pay | Admitting: Nurse Practitioner

## 2017-04-04 ENCOUNTER — Other Ambulatory Visit: Payer: Self-pay

## 2017-04-04 ENCOUNTER — Ambulatory Visit: Payer: Medicare HMO | Admitting: Nurse Practitioner

## 2017-04-04 VITALS — BP 126/77 | HR 102 | Temp 97.6°F | Ht 66.0 in | Wt 178.6 lb

## 2017-04-04 DIAGNOSIS — K2970 Gastritis, unspecified, without bleeding: Secondary | ICD-10-CM | POA: Diagnosis not present

## 2017-04-04 DIAGNOSIS — B9681 Helicobacter pylori [H. pylori] as the cause of diseases classified elsewhere: Secondary | ICD-10-CM | POA: Diagnosis not present

## 2017-04-04 DIAGNOSIS — K219 Gastro-esophageal reflux disease without esophagitis: Secondary | ICD-10-CM

## 2017-04-04 NOTE — Assessment & Plan Note (Signed)
She completed Pylera for H. pylori gastritis.  She is scheduled on the eighth to repeat breath testing for eradication and she will of been off her PPI for 14 days at that time.  Return for follow-up in 3 months.  Restart PPI after breath testing.

## 2017-04-04 NOTE — Addendum Note (Signed)
Addended by: Gordy Levan, Zaneta Lightcap A on: 04/04/2017 12:24 PM   Modules accepted: Orders

## 2017-04-04 NOTE — Assessment & Plan Note (Signed)
Persistent GERD symptoms off PPI awaiting hydrogen breath testing on the eighth of this month.  Based on research she can take Zantac 150 mg.  I recommended she start this over-the-counter until her breath testing at which point she can restart her PPI.  Return for follow-up in 3 months.  We will call her with results of hydrogen breath test.

## 2017-04-04 NOTE — Patient Instructions (Signed)
1. Please do breath test on the eighth of this month as planned. 2. You can take Zantac over-the-counter 150 mg based on instructions on the box (typically 1-2 times a day). 3. After your hydrogen breath test restart your acid blocker. 4. Return for follow-up in 3 months. 5. Call us if you have any questions or concerns.

## 2017-04-04 NOTE — Progress Notes (Signed)
CC'ED TO PCP 

## 2017-04-04 NOTE — Progress Notes (Signed)
Referring Provider: [name], MD Primary Care Physician:  [name], MD Primary GI:  Dr. Oneida Alar  Chief Complaint  Patient presents with  . Gastroesophageal Reflux    Omeprazole on hold for breath test    HPI:   [name] is a 52 y.o. female who presents for follow-up on GERD.  The patient was last seen in our office 02/14/2017 for hemorrhoids, H. pylori gastritis, GERD, constipation.  At last visit indicated that the patient took all of her Pylera.  Some occasional hemorrhoid flares not interested in creams or suppositories.  No other GI symptoms.  Recommended continue omeprazole, recheck breath test in January 2019 and hold omeprazole for 14 days prior to this, follow-up in 6 months.  The surgery about hemorrhoids if warranted.  Today she states she's doing so-so. 04/09/17 will be 14 days at which point she will have H. Pylori breath test completed. Her GERD symptoms are bad off PPI. Denies abdominal pain, N/V, hematochezia, melena, fever, chills, unintentional weight loss. Denies chest pain, dyspnea, dizziness, lightheadedness, syncope, near syncope. Denies any other upper or lower GI symptoms.  Takes NSAIDs "only if I need it." Hasn't had any in 3 weeks.  Past Medical History:  Diagnosis Date  . Abnormal uterine bleeding (AUB) 11/26/2012  . 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 Asthma   . BV (bacterial vaginosis) 11/26/2012  . Constipation   . Diabetes mellitus   . Fatigue 12/24/2012  . Fibroids 01/01/2013  . GERD (gastroesophageal reflux disease)   . Helicobacter pylori gastritis JAN 2017   EGD Bx  . Hematuria 04/05/2015  . Hemorrhoid 01/01/2013  . Hemorrhoids 04/05/2015  . Hypertension   . IBS (irritable bowel syndrome)   . Neuropathy due to secondary diabetes ([place])   . Other and unspecified ovarian cyst 01/01/2013  . Sarcoidosis   . Vaginal discharge 02/12/2013   +yeast  . Vaginal itching 02/14/2015  . Vaginal odor 04/05/2015    Past Surgical  History:  Procedure Laterality Date  . BACTERIAL OVERGROWTH TEST N/A 10/21/2015   Procedure: BACTERIAL OVERGROWTH TEST;  Surgeon: Danie Binder, MD;  [facility];  Service: Endoscopy;  Laterality: N/A;  0800  . CHOLECYSTECTOMY    . COLONOSCOPY N/A 08/20/2013   Dr. Oneida Alar: normal mucosa in terminal ileum, mild diverticulosis in ascending colon, moderate sized hemorrhoids s/p banding  . ENDOMETRIAL ABLATION    . ESOPHAGOGASTRODUODENOSCOPY N/A 04/13/2015   SLF: 1. FEDA due ot erosive gastriitis/ rectal bleeding   . FLEXIBLE SIGMOIDOSCOPY N/A 04/13/2015   SLF: 1. Rectal bleeding due to moderate sized internal hemorrhoids  . HEMORRHOID BANDING  08/20/2013   Procedure: HEMORRHOID BANDING;  Surgeon: Danie Binder, MD;  [facility];  Service: Endoscopy;;  . HEMORRHOID BANDING  04/13/2015   Procedure: Thayer Jew;  Surgeon: Danie Binder, MD;  [facility];  Service: Endoscopy;;  . HEMORRHOID SURGERY     banding  . HEMORRHOID SURGERY N/A 12/25/2013   Procedure: EXTENSIVE HEMORRHOIDECTOMY;  Surgeon: Jamesetta So, MD;  Location: [facility];  Service: General;  Laterality: N/A;  . SMART PILL PROCEDURE N/A 09/19/2015   Procedure: SMART PILL PROCEDURE;  Surgeon: Danie Binder, MD;  [facility];  Service: Endoscopy;  Laterality: N/A;  0800  . TUBAL LIGATION      Current Outpatient Medications  Medication Sig Dispense Refill  . atorvastatin (LIPITOR) 20 MG tablet Take 20 mg by mouth at bedtime.     Marland Kitchen  benazepril (LOTENSIN) 10 MG tablet Take 10 mg by mouth daily.     . fenofibrate (TRICOR) 145 MG tablet Take 145 mg by mouth at bedtime.     Marland Kitchen ibuprofen (ADVIL,MOTRIN) 800 MG tablet TAKE ONE TABLET BY MOUTH EVERY 8 HOURS AS NEEDED 60 tablet 2  . metFORMIN (GLUCOPHAGE) 1000 MG tablet Take 1,000 mg by mouth 2 (two) times daily with a meal.     . omeprazole (PRILOSEC) 20 MG capsule TAKE ONE CAPSULE BY MOUTH TWICE DAILY FOR 3 MONTHS, THEN TAKE ONE ONCE DAILY 60  capsule 5   No current facility-administered medications for this visit.     Allergies as of 04/04/2017 - Review Complete 04/04/2017  Allergen Reaction Noted  . Shellfish allergy Anaphylaxis 12/15/2011  . Other Itching 07/14/2015    Family History  Problem Relation Age of Onset  . Diabetes Father   . Cancer Mother        throat  . Cancer Brother        lung  . Breast cancer Cousin   . Cancer Maternal Aunt   . Diabetes Maternal Aunt   . Colon cancer Neg Hx     Social History   Socioeconomic History  . Marital status: Single    Spouse name: None  . Number of children: 2  . Years of education: None  . Highest education level: None  Social Needs  . Financial resource strain: None  . Food insecurity - worry: None  . Food insecurity - inability: None  . Transportation needs - medical: None  . Transportation needs - non-medical: None  Occupational History    Employer: UNEMPLOYED  Tobacco Use  . Smoking status: Former Smoker    Packs/day: 1.00    Years: 6.00    Pack years: 6.00    Types: Cigarettes    Last attempt to quit: 12/22/2003    Years since quitting: 13.2  . Smokeless tobacco: Never Used  . Tobacco comment: Quit  5 years  Substance and Sexual Activity  . Alcohol use: No  . Drug use: No  . Sexual activity: Not Currently    Birth control/protection: Surgical    Comment: tubal and ablation  Other Topics Concern  . None  Social History Narrative  . None    Review of Systems: Complete ROS negative except as per HPI.   Physical Exam: BP 126/77   Pulse (!) 102   Temp 97.6 F (36.4 C) (Oral)   Ht 5\' 6"  (1.676 m)   Wt 178 lb 9.6 oz (81 kg)   BMI 28.83 kg/m  General:   Alert and oriented. Pleasant and cooperative. Well-nourished and well-developed.  Eyes:  Without icterus, sclera clear and conjunctiva pink.  Ears:  Normal auditory acuity. Cardiovascular:  S1, S2 present without murmurs appreciated. Extremities without clubbing or edema. Respiratory:   Clear to auscultation bilaterally. No wheezes, rales, or rhonchi. No distress.  Gastrointestinal:  +BS, soft, non-tender and non-distended. No HSM noted. No guarding or rebound. No masses appreciated.  Rectal:  Deferred  Musculoskalatal:  Symmetrical without gross deformities. Neurologic:  Alert and oriented x4;  grossly normal neurologically. Psych:  Alert and cooperative. Normal mood and affect. Heme/Lymph/Immune: No excessive bruising noted.    04/04/2017 12:09 PM   Disclaimer: This note was dictated with voice recognition software. Similar sounding words can inadvertently be transcribed and may not be corrected upon review.

## 2017-04-09 DIAGNOSIS — K219 Gastro-esophageal reflux disease without esophagitis: Secondary | ICD-10-CM | POA: Diagnosis not present

## 2017-04-09 DIAGNOSIS — K2970 Gastritis, unspecified, without bleeding: Secondary | ICD-10-CM | POA: Diagnosis not present

## 2017-04-09 DIAGNOSIS — B9681 Helicobacter pylori [H. pylori] as the cause of diseases classified elsewhere: Secondary | ICD-10-CM | POA: Diagnosis not present

## 2017-04-10 ENCOUNTER — Encounter: Payer: Self-pay | Admitting: Adult Health

## 2017-04-10 ENCOUNTER — Ambulatory Visit (INDEPENDENT_AMBULATORY_CARE_PROVIDER_SITE_OTHER): Payer: Medicare HMO | Admitting: Adult Health

## 2017-04-10 ENCOUNTER — Other Ambulatory Visit: Payer: Self-pay

## 2017-04-10 VITALS — BP 126/70 | HR 86 | Resp 20 | Ht 66.0 in | Wt 179.0 lb

## 2017-04-10 DIAGNOSIS — Z1211 Encounter for screening for malignant neoplasm of colon: Secondary | ICD-10-CM | POA: Diagnosis not present

## 2017-04-10 DIAGNOSIS — Z01411 Encounter for gynecological examination (general) (routine) with abnormal findings: Secondary | ICD-10-CM

## 2017-04-10 DIAGNOSIS — F329 Major depressive disorder, single episode, unspecified: Secondary | ICD-10-CM | POA: Diagnosis not present

## 2017-04-10 DIAGNOSIS — Z01419 Encounter for gynecological examination (general) (routine) without abnormal findings: Secondary | ICD-10-CM | POA: Insufficient documentation

## 2017-04-10 DIAGNOSIS — Z1212 Encounter for screening for malignant neoplasm of rectum: Secondary | ICD-10-CM

## 2017-04-10 DIAGNOSIS — F419 Anxiety disorder, unspecified: Secondary | ICD-10-CM

## 2017-04-10 DIAGNOSIS — R69 Illness, unspecified: Secondary | ICD-10-CM | POA: Diagnosis not present

## 2017-04-10 DIAGNOSIS — F32A Depression, unspecified: Secondary | ICD-10-CM | POA: Insufficient documentation

## 2017-04-10 DIAGNOSIS — F439 Reaction to severe stress, unspecified: Secondary | ICD-10-CM | POA: Diagnosis not present

## 2017-04-10 LAB — HEMOCCULT GUIAC POC 1CARD (OFFICE): FECAL OCCULT BLD: NEGATIVE

## 2017-04-10 LAB — H. PYLORI BREATH TEST: H. pylori Breath Test: NOT DETECTED

## 2017-04-10 MED ORDER — ESCITALOPRAM OXALATE 10 MG PO TABS: 10.0000 mg | ORAL_TABLET | Freq: Every day | ORAL | 12 refills | Status: DC
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
## 2017-04-15 NOTE — Progress Notes (Signed)
Pt is aware.  

## 2017-05-22 ENCOUNTER — Ambulatory Visit: Payer: Medicare HMO | Admitting: Adult Health

## 2017-06-04 ENCOUNTER — Ambulatory Visit: Payer: Medicare HMO | Admitting: Adult Health

## 2017-06-11 ENCOUNTER — Encounter: Payer: Self-pay | Admitting: Adult Health

## 2017-06-11 ENCOUNTER — Ambulatory Visit (INDEPENDENT_AMBULATORY_CARE_PROVIDER_SITE_OTHER): Payer: Medicare HMO | Admitting: Adult Health

## 2017-06-11 VITALS — BP 122/80 | HR 90 | Ht 66.0 in | Wt 179.0 lb

## 2017-06-11 DIAGNOSIS — F32A Depression, unspecified: Secondary | ICD-10-CM

## 2017-06-11 DIAGNOSIS — F418 Other specified anxiety disorders: Secondary | ICD-10-CM

## 2017-06-11 DIAGNOSIS — F329 Major depressive disorder, single episode, unspecified: Secondary | ICD-10-CM

## 2017-06-11 DIAGNOSIS — R69 Illness, unspecified: Secondary | ICD-10-CM | POA: Diagnosis not present

## 2017-06-11 DIAGNOSIS — F419 Anxiety disorder, unspecified: Principal | ICD-10-CM

## 2017-06-11 MED ORDER — BUSPIRONE HCL 7.5 MG PO TABS: 7.5000 mg | ORAL_TABLET | Freq: Three times a day (TID) | ORAL | 2 refills | Status: DC

## 2017-06-11 NOTE — Progress Notes (Signed)
Subjective:     Patient ID: Sahory Nordling, female   DOB: 04-29-1965, 52 y.o.   MRN: [id number]  HPI Jendaya is a 52 year old black female back in follow up of starting lexapro.  Review of Systems Not sleeping well Still anxious Reviewed past medical,surgical, social and family history. Reviewed medications and allergies.     Objective:   Physical Exam BP 122/80 (BP Location: Left Arm, Patient Position: Sitting, Cuff Size: Normal)   Pulse 90   Ht 5\' 6"  (1.676 m)   Wt 179 lb (81.2 kg)   BMI 28.89 kg/m Talk only:   PHQ 9 score 11, denies being suicidal, does not think lexapro helped, is on Cymbalta from PCP bid.  Assessment:     1. Anxiety and depression       Plan:     Stop lexapro Will add buspar Meds ordered this encounter  Medications  . busPIRone (BUSPAR) 7.5 MG tablet    Sig: Take 1 tablet (7.5 mg total) by mouth 3 (three) times daily.    Dispense:  90 tablet    Refill:  2    Order Specific Question:   Supervising Provider    Answer:   Florian Buff [[id number]]  She says she is on cymbalta from PCP, to continue 1 bid Get involved with church activities, exercise at home when watching TV, don't just sit in apartment alone F/U in 6 weeks, may need something for sleep

## 2017-06-22 DIAGNOSIS — E785 Hyperlipidemia, unspecified: Secondary | ICD-10-CM | POA: Diagnosis not present

## 2017-06-22 DIAGNOSIS — R69 Illness, unspecified: Secondary | ICD-10-CM | POA: Diagnosis not present

## 2017-06-22 DIAGNOSIS — E119 Type 2 diabetes mellitus without complications: Secondary | ICD-10-CM | POA: Diagnosis not present

## 2017-06-22 DIAGNOSIS — I499 Cardiac arrhythmia, unspecified: Secondary | ICD-10-CM | POA: Diagnosis not present

## 2017-06-22 DIAGNOSIS — K219 Gastro-esophageal reflux disease without esophagitis: Secondary | ICD-10-CM | POA: Diagnosis not present

## 2017-06-22 DIAGNOSIS — J988 Other specified respiratory disorders: Secondary | ICD-10-CM | POA: Diagnosis not present

## 2017-06-22 DIAGNOSIS — G8929 Other chronic pain: Secondary | ICD-10-CM | POA: Diagnosis not present

## 2017-06-22 DIAGNOSIS — F419 Anxiety disorder, unspecified: Secondary | ICD-10-CM | POA: Diagnosis not present

## 2017-06-22 DIAGNOSIS — I10 Essential (primary) hypertension: Secondary | ICD-10-CM | POA: Diagnosis not present

## 2017-06-22 DIAGNOSIS — R110 Nausea: Secondary | ICD-10-CM | POA: Diagnosis not present

## 2017-06-27 DIAGNOSIS — E119 Type 2 diabetes mellitus without complications: Secondary | ICD-10-CM | POA: Diagnosis not present

## 2017-06-29 DIAGNOSIS — E119 Type 2 diabetes mellitus without complications: Secondary | ICD-10-CM | POA: Diagnosis not present

## 2017-06-30 DIAGNOSIS — E119 Type 2 diabetes mellitus without complications: Secondary | ICD-10-CM | POA: Diagnosis not present

## 2017-06-30 DIAGNOSIS — E7800 Pure hypercholesterolemia, unspecified: Secondary | ICD-10-CM | POA: Diagnosis not present

## 2017-06-30 DIAGNOSIS — Z87891 Personal history of nicotine dependence: Secondary | ICD-10-CM | POA: Diagnosis not present

## 2017-06-30 DIAGNOSIS — Z8249 Family history of ischemic heart disease and other diseases of the circulatory system: Secondary | ICD-10-CM | POA: Diagnosis not present

## 2017-06-30 DIAGNOSIS — I10 Essential (primary) hypertension: Secondary | ICD-10-CM | POA: Diagnosis not present

## 2017-06-30 DIAGNOSIS — J45909 Unspecified asthma, uncomplicated: Secondary | ICD-10-CM | POA: Diagnosis not present

## 2017-06-30 DIAGNOSIS — M7989 Other specified soft tissue disorders: Secondary | ICD-10-CM | POA: Diagnosis not present

## 2017-06-30 DIAGNOSIS — Z79899 Other long term (current) drug therapy: Secondary | ICD-10-CM | POA: Diagnosis not present

## 2017-06-30 DIAGNOSIS — Z7984 Long term (current) use of oral hypoglycemic drugs: Secondary | ICD-10-CM | POA: Diagnosis not present

## 2017-07-01 DIAGNOSIS — M79661 Pain in right lower leg: Secondary | ICD-10-CM | POA: Diagnosis not present

## 2017-07-01 DIAGNOSIS — M7989 Other specified soft tissue disorders: Secondary | ICD-10-CM | POA: Diagnosis not present

## 2017-07-01 DIAGNOSIS — M79604 Pain in right leg: Secondary | ICD-10-CM | POA: Diagnosis not present

## 2017-07-02 DIAGNOSIS — E7849 Other hyperlipidemia: Secondary | ICD-10-CM | POA: Diagnosis not present

## 2017-07-02 DIAGNOSIS — K210 Gastro-esophageal reflux disease with esophagitis: Secondary | ICD-10-CM | POA: Diagnosis not present

## 2017-07-02 DIAGNOSIS — Z1389 Encounter for screening for other disorder: Secondary | ICD-10-CM | POA: Diagnosis not present

## 2017-07-02 DIAGNOSIS — I10 Essential (primary) hypertension: Secondary | ICD-10-CM | POA: Diagnosis not present

## 2017-07-02 DIAGNOSIS — M7061 Trochanteric bursitis, right hip: Secondary | ICD-10-CM | POA: Diagnosis not present

## 2017-07-02 DIAGNOSIS — Z0000 Encounter for general adult medical examination without abnormal findings: Secondary | ICD-10-CM | POA: Diagnosis not present

## 2017-07-02 DIAGNOSIS — E119 Type 2 diabetes mellitus without complications: Secondary | ICD-10-CM | POA: Diagnosis not present

## 2017-07-02 DIAGNOSIS — Z6829 Body mass index (BMI) 29.0-29.9, adult: Secondary | ICD-10-CM | POA: Diagnosis not present

## 2017-07-02 NOTE — Progress Notes (Signed)
Referring Provider: [name], MD Primary Care Physician:  [name], MD Primary GI:  Dr. Oneida Alar  Chief Complaint  Patient presents with  . Gastroesophageal Reflux    f/u. Doing okay    HPI:   [name] is a 52 y.o. female who presents for follow-up on GERD.  The patient was last seen in our office 04/04/2017 for GERD and H. pylori gastritis.  Status post H. pylori treatment with Pylera.  Noted at her last visit she was doing so-so.  She was holding her PPI for 14 days in order to test for eradication.  She noted her symptoms of GERD are pretty bad off PPI.  No other GI complaints.  Recommended she have breath test completed as recommended, take Zantac over-the-counter 150 mg 1-2 times a day as needed for GERD.  Restart PPI after H. pylori breath test.  Follow-up in 3 months.  H. pylori breath test completed 04/09/2017 which confirmed eradication.  Today she states she's doing well overall. GERD symptoms are doing "a whole lot better."  Is on Prilosec bid currently. ionce daily dosing was not effective. Rare to no breakthrough symptoms. Denies abdominal pain, n/V, hematochezia, melena, fever, chills, unintentional weight loss. Denies chest pain, dyspnea, dizziness, lightheadedness, syncope, near syncope. Denies any other upper or lower GI symptoms.  On Ibuprofen as needed, generally less than once a week.  Past Medical History:  Diagnosis Date  . Abnormal uterine bleeding (AUB) 11/26/2012  . 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 Asthma   . BV (bacterial vaginosis) 11/26/2012  . Constipation   . Diabetes mellitus   . Fatigue 12/24/2012  . Fibroids 01/01/2013  . GERD (gastroesophageal reflux disease)   . Helicobacter pylori gastritis JAN 2017   EGD Bx  . Hematuria 04/05/2015  . Hemorrhoid 01/01/2013  . Hemorrhoids 04/05/2015  . Hypertension   . IBS (irritable bowel syndrome)   . Neuropathy due to secondary diabetes ([place])   . Other and unspecified ovarian cyst  01/01/2013  . Sarcoidosis   . Vaginal discharge 02/12/2013   +yeast  . Vaginal itching 02/14/2015  . Vaginal odor 04/05/2015    Past Surgical History:  Procedure Laterality Date  . BACTERIAL OVERGROWTH TEST N/A 10/21/2015   Procedure: BACTERIAL OVERGROWTH TEST;  Surgeon: Danie Binder, MD;  [facility];  Service: Endoscopy;  Laterality: N/A;  0800  . CHOLECYSTECTOMY    . COLONOSCOPY N/A 08/20/2013   Dr. Oneida Alar: normal mucosa in terminal ileum, mild diverticulosis in ascending colon, moderate sized hemorrhoids s/p banding  . ENDOMETRIAL ABLATION    . ESOPHAGOGASTRODUODENOSCOPY N/A 04/13/2015   SLF: 1. FEDA due ot erosive gastriitis/ rectal bleeding   . FLEXIBLE SIGMOIDOSCOPY N/A 04/13/2015   SLF: 1. Rectal bleeding due to moderate sized internal hemorrhoids  . HEMORRHOID BANDING  08/20/2013   Procedure: HEMORRHOID BANDING;  Surgeon: Danie Binder, MD;  [facility];  Service: Endoscopy;;  . HEMORRHOID BANDING  04/13/2015   Procedure: Thayer Jew;  Surgeon: Danie Binder, MD;  [facility];  Service: Endoscopy;;  . HEMORRHOID SURGERY     banding  . HEMORRHOID SURGERY N/A 12/25/2013   Procedure: EXTENSIVE HEMORRHOIDECTOMY;  Surgeon: Jamesetta So, MD;  Location: [facility];  Service: General;  Laterality: N/A;  . SMART PILL PROCEDURE N/A 09/19/2015   Procedure: SMART PILL PROCEDURE;  Surgeon: Danie Binder, MD;  [facility];  Service: Endoscopy;  Laterality: N/A;  0800  .  TUBAL LIGATION      Current Outpatient Medications  Medication Sig Dispense Refill  . atorvastatin (LIPITOR) 20 MG tablet Take 20 mg by mouth at bedtime.     . benazepril (LOTENSIN) 10 MG tablet Take 10 mg by mouth daily.     . DULoxetine (CYMBALTA) 30 MG capsule Take 30 mg by mouth 2 (two) times daily.    . fenofibrate (TRICOR) 145 MG tablet Take 145 mg by mouth at bedtime.     Marland Kitchen ibuprofen (ADVIL,MOTRIN) 800 MG tablet TAKE ONE TABLET BY MOUTH EVERY 8 HOURS AS NEEDED 60  tablet 2  . metFORMIN (GLUCOPHAGE) 1000 MG tablet Take 1,000 mg by mouth 2 (two) times daily with a meal.     . omeprazole (PRILOSEC) 20 MG capsule TAKE ONE CAPSULE BY MOUTH TWICE DAILY FOR 3 MONTHS, THEN TAKE ONE ONCE DAILY 60 capsule 5   No current facility-administered medications for this visit.     Allergies as of 07/03/2017 - Review Complete 07/03/2017  Allergen Reaction Noted  . Shellfish allergy Anaphylaxis 12/15/2011  . Other Itching 07/14/2015    Family History  Problem Relation Age of Onset  . Diabetes Father   . Cancer Mother        throat  . Cancer Brother        lung  . Breast cancer Cousin   . Cancer Maternal Aunt   . Diabetes Maternal Aunt   . Colon cancer Neg Hx     Social History   Socioeconomic History  . Marital status: Single    Spouse name: Not on file  . Number of children: 2  . Years of education: Not on file  . Highest education level: Not on file  Occupational History    Employer: UNEMPLOYED  Social Needs  . Financial resource strain: Not on file  . Food insecurity:    Worry: Not on file    Inability: Not on file  . Transportation needs:    Medical: Not on file    Non-medical: Not on file  Tobacco Use  . Smoking status: Former Smoker    Packs/day: 1.00    Years: 6.00    Pack years: 6.00    Types: Cigarettes    Last attempt to quit: 12/22/2003    Years since quitting: 13.5  . Smokeless tobacco: Never Used  . Tobacco comment: Quit  5 years  Substance and Sexual Activity  . Alcohol use: No  . Drug use: No  . Sexual activity: Not Currently    Birth control/protection: Surgical    Comment: tubal and ablation  Lifestyle  . Physical activity:    Days per week: Not on file    Minutes per session: Not on file  . Stress: Not on file  Relationships  . Social connections:    Talks on phone: Not on file    Gets together: Not on file    Attends religious service: Not on file    Active member of club or organization: Not on file     Attends meetings of clubs or organizations: Not on file    Relationship status: Not on file  Other Topics Concern  . Not on file  Social History Narrative  . Not on file    Review of Systems: Complete ROS negative except as per HPI.   Physical Exam: BP 116/72   Pulse 84   Temp (!) 97.2 F (36.2 C) (Oral)   Ht 5\' 6"  (1.676 m)   Wt 177  lb 3.2 oz (80.4 kg)   BMI 28.60 kg/m  General:   Alert and oriented. Pleasant and cooperative. Well-nourished and well-developed.  Eyes:  Without icterus, sclera clear and conjunctiva pink.  Ears:  Normal auditory acuity. Cardiovascular:  S1, S2 present without murmurs appreciated. Extremities without clubbing or edema. Respiratory:  Clear to auscultation bilaterally. No wheezes, rales, or rhonchi. No distress.  Gastrointestinal:  +BS, soft, non-tender and non-distended. No HSM noted. No guarding or rebound. No masses appreciated.  Rectal:  Deferred  Musculoskalatal:  Symmetrical without gross deformities. Neurologic:  Alert and oriented x4;  grossly normal neurologically. Psych:  Alert and cooperative. Normal mood and affect. Heme/Lymph/Immune: No excessive bruising noted.    07/03/2017 11:19 AM   Disclaimer: This note was dictated with voice recognition software. Similar sounding words can inadvertently be transcribed and may not be corrected upon review.

## 2017-07-03 ENCOUNTER — Encounter: Payer: Self-pay | Admitting: Nurse Practitioner

## 2017-07-03 ENCOUNTER — Ambulatory Visit (INDEPENDENT_AMBULATORY_CARE_PROVIDER_SITE_OTHER): Payer: Medicare HMO | Admitting: Nurse Practitioner

## 2017-07-03 VITALS — BP 116/72 | HR 84 | Temp 97.2°F | Ht 66.0 in | Wt 177.2 lb

## 2017-07-03 DIAGNOSIS — K2970 Gastritis, unspecified, without bleeding: Secondary | ICD-10-CM | POA: Diagnosis not present

## 2017-07-03 DIAGNOSIS — K219 Gastro-esophageal reflux disease without esophagitis: Secondary | ICD-10-CM

## 2017-07-03 DIAGNOSIS — B9681 Helicobacter pylori [H. pylori] as the cause of diseases classified elsewhere: Secondary | ICD-10-CM

## 2017-07-03 NOTE — Progress Notes (Signed)
CC'D TO PCP °

## 2017-07-03 NOTE — Assessment & Plan Note (Signed)
GERD symptoms doing well on omeprazole twice a day.  Recommend she continue her current medications.  Intact as needed.  Follow-up in 6 months.

## 2017-07-03 NOTE — Patient Instructions (Signed)
1. Continue taking your current medications. 2. Specifically continue taking Prilosec twice a day. 3. Follow-up in 6 months. 4. Call us if you have any questions or concerns.   It was great to see you today!  Enjoy the sunshine!!!!!!     At [facility] we value your feedback. You may receive a survey about your visit today. Please share your experience as we strive to create trusing relationships with our patients to provide genuine, compassionate, quality care.

## 2017-07-03 NOTE — Assessment & Plan Note (Signed)
H. pylori gastritis treated with Pylera in September 2018.  H. pylori breath testing confirmed eradication.  Continue current PPI.  Follow-up in 6 months.

## 2017-07-09 DIAGNOSIS — R69 Illness, unspecified: Secondary | ICD-10-CM | POA: Diagnosis not present

## 2017-07-23 ENCOUNTER — Ambulatory Visit: Payer: Medicare HMO | Admitting: Adult Health

## 2017-09-27 DIAGNOSIS — E119 Type 2 diabetes mellitus without complications: Secondary | ICD-10-CM | POA: Diagnosis not present

## 2017-09-28 DIAGNOSIS — E119 Type 2 diabetes mellitus without complications: Secondary | ICD-10-CM | POA: Diagnosis not present

## 2017-10-07 DIAGNOSIS — I10 Essential (primary) hypertension: Secondary | ICD-10-CM | POA: Diagnosis not present

## 2017-10-07 DIAGNOSIS — K5909 Other constipation: Secondary | ICD-10-CM | POA: Diagnosis not present

## 2017-10-07 DIAGNOSIS — E119 Type 2 diabetes mellitus without complications: Secondary | ICD-10-CM | POA: Diagnosis not present

## 2017-10-07 DIAGNOSIS — K210 Gastro-esophageal reflux disease with esophagitis: Secondary | ICD-10-CM | POA: Diagnosis not present

## 2017-10-07 DIAGNOSIS — Z6828 Body mass index (BMI) 28.0-28.9, adult: Secondary | ICD-10-CM | POA: Diagnosis not present

## 2017-10-07 DIAGNOSIS — E7849 Other hyperlipidemia: Secondary | ICD-10-CM | POA: Diagnosis not present

## 2017-10-08 DIAGNOSIS — R109 Unspecified abdominal pain: Secondary | ICD-10-CM | POA: Diagnosis not present

## 2017-10-08 DIAGNOSIS — R1084 Generalized abdominal pain: Secondary | ICD-10-CM | POA: Diagnosis not present

## 2017-10-18 DIAGNOSIS — Z79899 Other long term (current) drug therapy: Secondary | ICD-10-CM | POA: Diagnosis not present

## 2017-10-18 DIAGNOSIS — H04123 Dry eye syndrome of bilateral lacrimal glands: Secondary | ICD-10-CM | POA: Diagnosis not present

## 2017-10-21 DIAGNOSIS — M7071 Other bursitis of hip, right hip: Secondary | ICD-10-CM | POA: Diagnosis not present

## 2017-11-08 DIAGNOSIS — E7849 Other hyperlipidemia: Secondary | ICD-10-CM | POA: Diagnosis not present

## 2017-11-08 DIAGNOSIS — E1165 Type 2 diabetes mellitus with hyperglycemia: Secondary | ICD-10-CM | POA: Diagnosis not present

## 2017-11-08 DIAGNOSIS — I10 Essential (primary) hypertension: Secondary | ICD-10-CM | POA: Diagnosis not present

## 2017-11-12 DIAGNOSIS — R69 Illness, unspecified: Secondary | ICD-10-CM | POA: Diagnosis not present

## 2017-12-24 DIAGNOSIS — J301 Allergic rhinitis due to pollen: Secondary | ICD-10-CM | POA: Diagnosis not present

## 2017-12-24 DIAGNOSIS — J411 Mucopurulent chronic bronchitis: Secondary | ICD-10-CM | POA: Diagnosis not present

## 2017-12-24 DIAGNOSIS — D869 Sarcoidosis, unspecified: Secondary | ICD-10-CM | POA: Diagnosis not present

## 2017-12-24 DIAGNOSIS — J40 Bronchitis, not specified as acute or chronic: Secondary | ICD-10-CM | POA: Diagnosis not present

## 2017-12-27 DIAGNOSIS — E119 Type 2 diabetes mellitus without complications: Secondary | ICD-10-CM | POA: Diagnosis not present

## 2017-12-28 DIAGNOSIS — E119 Type 2 diabetes mellitus without complications: Secondary | ICD-10-CM | POA: Diagnosis not present

## 2018-01-02 ENCOUNTER — Ambulatory Visit (INDEPENDENT_AMBULATORY_CARE_PROVIDER_SITE_OTHER): Payer: Medicare HMO | Admitting: Nurse Practitioner

## 2018-01-02 ENCOUNTER — Encounter: Payer: Self-pay | Admitting: Nurse Practitioner

## 2018-01-02 VITALS — BP 110/69 | HR 91 | Temp 97.1°F | Ht 66.0 in | Wt 180.0 lb

## 2018-01-02 DIAGNOSIS — K219 Gastro-esophageal reflux disease without esophagitis: Secondary | ICD-10-CM | POA: Diagnosis not present

## 2018-01-02 DIAGNOSIS — K644 Residual hemorrhoidal skin tags: Secondary | ICD-10-CM | POA: Diagnosis not present

## 2018-01-02 DIAGNOSIS — K5900 Constipation, unspecified: Secondary | ICD-10-CM

## 2018-01-02 MED ORDER — HYDROCORTISONE 2.5 % RE CREA: 1.0000 "application " | TOPICAL_CREAM | Freq: Two times a day (BID) | RECTAL | 1 refills | Status: DC

## 2018-01-02 NOTE — Progress Notes (Signed)
Referring Provider: [name], MD Primary Care Physician:  [name], MD Primary GI:  Dr. Oneida Alar  Chief Complaint  Patient presents with  . Gastroesophageal Reflux    f/u.  Marland Kitchen Hemorrhoids  . Constipation    HPI:   [name] is a 52 y.o. female who presents for GERD, hemorrhoids, constipation.  The patient was last seen in our office 07/03/2017 for GERD and H. pylori gastritis.  She was previously diagnosed for H. pylori gastritis and subsequently treated with Pylera.  She held her PPI for 14 days to test for eradication and had pretty bad GERD symptoms off PPI.  H. pylori breath test completed 04/09/2017 confirmed eradication.  At her last visit she was doing well overall.  GERD symptoms are "a whole lot better."  On Prilosec twice daily currently, once daily was not as effective.  Rare no breakthrough.  No other GI symptoms.  On ibuprofen as needed, generally less than once a week.  Recommended continue current medications, follow-up in 6 months.  Today she states she's not doing as well. Is under a lot of stress related to her son being hospitalized for pneumonia. She had significant diarrhea four days ago which caused flare of hemorrhoid symptoms. This was in the setting of significant stress. Currently she is constipated and not wanting to straing (to avoid worsening hemorrhoid symptoms). GERD symptoms are doing ok overall, some breakthrough based on dietary indescretions. Denies abdominal pain, N/V. Denies chest pain, dyspnea, dizziness, lightheadedness, syncope, near syncope. Denies any other upper or lower GI symptoms.  Past Medical History:  Diagnosis Date  . Abnormal uterine bleeding (AUB) 11/26/2012  . 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 Asthma   . BV (bacterial vaginosis) 11/26/2012  . Constipation   . Diabetes mellitus   . Fatigue 12/24/2012  . Fibroids 01/01/2013  . GERD (gastroesophageal reflux disease)   . Helicobacter pylori gastritis JAN 2017   EGD  Bx  . Hematuria 04/05/2015  . Hemorrhoid 01/01/2013  . Hemorrhoids 04/05/2015  . Hypertension   . IBS (irritable bowel syndrome)   . Neuropathy due to secondary diabetes ([place])   . Other and unspecified ovarian cyst 01/01/2013  . Sarcoidosis   . Vaginal discharge 02/12/2013   +yeast  . Vaginal itching 02/14/2015  . Vaginal odor 04/05/2015    Past Surgical History:  Procedure Laterality Date  . BACTERIAL OVERGROWTH TEST N/A 10/21/2015   Procedure: BACTERIAL OVERGROWTH TEST;  Surgeon: Danie Binder, MD;  [facility];  Service: Endoscopy;  Laterality: N/A;  0800  . CHOLECYSTECTOMY    . COLONOSCOPY N/A 08/20/2013   Dr. Oneida Alar: normal mucosa in terminal ileum, mild diverticulosis in ascending colon, moderate sized hemorrhoids s/p banding  . ENDOMETRIAL ABLATION    . ESOPHAGOGASTRODUODENOSCOPY N/A 04/13/2015   SLF: 1. FEDA due ot erosive gastriitis/ rectal bleeding   . FLEXIBLE SIGMOIDOSCOPY N/A 04/13/2015   SLF: 1. Rectal bleeding due to moderate sized internal hemorrhoids  . HEMORRHOID BANDING  08/20/2013   Procedure: HEMORRHOID BANDING;  Surgeon: Danie Binder, MD;  [facility];  Service: Endoscopy;;  . HEMORRHOID BANDING  04/13/2015   Procedure: Thayer Jew;  Surgeon: Danie Binder, MD;  [facility];  Service: Endoscopy;;  . HEMORRHOID SURGERY     banding  . HEMORRHOID SURGERY N/A 12/25/2013   Procedure: EXTENSIVE HEMORRHOIDECTOMY;  Surgeon: Jamesetta So, MD;  Location: [facility];  Service: General;  Laterality: N/A;  . SMART  PILL PROCEDURE N/A 09/19/2015   Procedure: SMART PILL PROCEDURE;  Surgeon: Danie Binder, MD;  [facility];  Service: Endoscopy;  Laterality: N/A;  0800  . TUBAL LIGATION      Current Outpatient Medications  Medication Sig Dispense Refill  . atorvastatin (LIPITOR) 20 MG tablet Take 20 mg by mouth at bedtime.     . benazepril (LOTENSIN) 10 MG tablet Take 10 mg by mouth daily.     . fenofibrate (TRICOR) 145 MG  tablet Take 145 mg by mouth at bedtime.     . metFORMIN (GLUCOPHAGE) 1000 MG tablet Take 1,000 mg by mouth 2 (two) times daily with a meal.     . omeprazole (PRILOSEC) 20 MG capsule TAKE ONE CAPSULE BY MOUTH TWICE DAILY FOR 3 MONTHS, THEN TAKE ONE ONCE DAILY 60 capsule 5   No current facility-administered medications for this visit.     Allergies as of 01/02/2018 - Review Complete 01/02/2018  Allergen Reaction Noted  . Shellfish allergy Anaphylaxis 12/15/2011  . Other Itching 07/14/2015    Family History  Problem Relation Age of Onset  . Diabetes Father   . Cancer Mother        throat  . Cancer Brother        lung  . Breast cancer Cousin   . Cancer Maternal Aunt   . Diabetes Maternal Aunt   . Colon cancer Neg Hx     Social History   Socioeconomic History  . Marital status: Single    Spouse name: Not on file  . Number of children: 2  . Years of education: Not on file  . Highest education level: Not on file  Occupational History    Employer: UNEMPLOYED  Social Needs  . Financial resource strain: Not on file  . Food insecurity:    Worry: Not on file    Inability: Not on file  . Transportation needs:    Medical: Not on file    Non-medical: Not on file  Tobacco Use  . Smoking status: Former Smoker    Packs/day: 1.00    Years: 6.00    Pack years: 6.00    Types: Cigarettes    Last attempt to quit: 12/22/2003    Years since quitting: 14.0  . Smokeless tobacco: Never Used  . Tobacco comment: Quit  5 years  Substance and Sexual Activity  . Alcohol use: No  . Drug use: No  . Sexual activity: Not Currently    Birth control/protection: Surgical    Comment: tubal and ablation  Lifestyle  . Physical activity:    Days per week: Not on file    Minutes per session: Not on file  . Stress: Not on file  Relationships  . Social connections:    Talks on phone: Not on file    Gets together: Not on file    Attends religious service: Not on file    Active member of club or  organization: Not on file    Attends meetings of clubs or organizations: Not on file    Relationship status: Not on file  Other Topics Concern  . Not on file  Social History Narrative  . Not on file    Review of Systems: Complete ROS negative except as per HPI.   Physical Exam: BP 110/69   Pulse 91   Temp (!) 97.1 F (36.2 C) (Oral)   Ht 5\' 6"  (1.676 m)   Wt 180 lb (81.6 kg)   BMI 29.05  kg/m  General:   Alert and oriented. Pleasant and cooperative. Well-nourished and well-developed.  Eyes:  Without icterus, sclera clear and conjunctiva pink.  Ears:  Normal auditory acuity. Cardiovascular:  S1, S2 present without murmurs appreciated. Extremities without clubbing or edema. Respiratory:  Clear to auscultation bilaterally. No wheezes, rales, or rhonchi. No distress.  Gastrointestinal:  +BS, soft, non-tender and non-distended. No HSM noted. No guarding or rebound. No masses appreciated.  Rectal:  Deferred  Musculoskalatal:  Symmetrical without gross deformities. Neurologic:  Alert and oriented x4;  grossly normal neurologically. Psych:  Alert and cooperative. Normal mood and affect. Heme/Lymph/Immune: No excessive bruising noted.    01/02/2018 11:00 AM   Disclaimer: This note was dictated with voice recognition software. Similar sounding words can inadvertently be transcribed and may not be corrected upon review.

## 2018-01-02 NOTE — Patient Instructions (Signed)
1. As we discussed, you can use MiraLAX 1-2 times a day as needed for constipation. 2. Continue your other medications. 3. I have sent a refill of Anusol rectal cream to your pharmacy.  You can use this up to twice a day for up to 10 days at a time when your hemorrhoids flare. 4. Return for follow-up in 6 months. 5. Call us if you have any questions or concerns.  At [facility] we value your feedback. You may receive a survey about your visit today. Please share your experience as we strive to create trusting relationships with our patients to provide genuine, compassionate, quality care.  We appreciate your understanding and patience as we review any laboratory studies, imaging, and other diagnostic tests that are ordered as we care for you. Our office policy is 5 business days for review of these results, and any emergent or urgent results are addressed in a timely manner for your best interest. If you do not hear from our office in 1 week, please contact us.   We also encourage the use of MyChart, which contains your medical information for your review as well. If you are not enrolled in this feature, an access code is on this after visit summary for your convenience. Thank you for allowing [place] to be involved in your care.  It was great to see you today!  I hope you have a great Fall!!

## 2018-01-02 NOTE — Assessment & Plan Note (Signed)
The patient describes intermittent, nonsevere constipation.  She is not requiring or requesting a prescription medication.  I recommended she use MiraLAX 1-2 times a day as needed.  Follow-up in 6 months.

## 2018-01-02 NOTE — Assessment & Plan Note (Signed)
GERD symptoms doing well per the patient.  Recommend she continue her PPI.  Follow-up in 6 months.

## 2018-01-02 NOTE — Assessment & Plan Note (Signed)
Exacerbation of hemorrhoid symptoms in the setting of diarrhea over the weekend likely brought on by significant stress with the hospitalization of her son with pneumonia.  Diarrhea has since abated.  She does not have any hemorrhoid cream currently.  I will resend a prescription for Anusol that she can use as needed for hemorrhoid symptoms.  Continue other current medications.  Follow-up in 6 months.

## 2018-01-06 NOTE — Progress Notes (Signed)
CC'D TO PCP °

## 2018-01-08 DIAGNOSIS — E1165 Type 2 diabetes mellitus with hyperglycemia: Secondary | ICD-10-CM | POA: Diagnosis not present

## 2018-01-08 DIAGNOSIS — E7849 Other hyperlipidemia: Secondary | ICD-10-CM | POA: Diagnosis not present

## 2018-01-08 DIAGNOSIS — I10 Essential (primary) hypertension: Secondary | ICD-10-CM | POA: Diagnosis not present

## 2018-01-09 DIAGNOSIS — R69 Illness, unspecified: Secondary | ICD-10-CM | POA: Diagnosis not present

## 2018-01-14 DIAGNOSIS — K210 Gastro-esophageal reflux disease with esophagitis: Secondary | ICD-10-CM | POA: Diagnosis not present

## 2018-01-14 DIAGNOSIS — E11610 Type 2 diabetes mellitus with diabetic neuropathic arthropathy: Secondary | ICD-10-CM | POA: Diagnosis not present

## 2018-01-14 DIAGNOSIS — M7071 Other bursitis of hip, right hip: Secondary | ICD-10-CM | POA: Diagnosis not present

## 2018-01-14 DIAGNOSIS — Z6829 Body mass index (BMI) 29.0-29.9, adult: Secondary | ICD-10-CM | POA: Diagnosis not present

## 2018-01-14 DIAGNOSIS — E7849 Other hyperlipidemia: Secondary | ICD-10-CM | POA: Diagnosis not present

## 2018-01-14 DIAGNOSIS — I10 Essential (primary) hypertension: Secondary | ICD-10-CM | POA: Diagnosis not present

## 2018-01-18 DIAGNOSIS — K760 Fatty (change of) liver, not elsewhere classified: Secondary | ICD-10-CM | POA: Diagnosis not present

## 2018-01-18 DIAGNOSIS — D251 Intramural leiomyoma of uterus: Secondary | ICD-10-CM | POA: Diagnosis not present

## 2018-01-18 DIAGNOSIS — M5136 Other intervertebral disc degeneration, lumbar region: Secondary | ICD-10-CM | POA: Diagnosis not present

## 2018-01-19 DIAGNOSIS — E119 Type 2 diabetes mellitus without complications: Secondary | ICD-10-CM | POA: Diagnosis not present

## 2018-01-19 DIAGNOSIS — D251 Intramural leiomyoma of uterus: Secondary | ICD-10-CM | POA: Diagnosis not present

## 2018-01-19 DIAGNOSIS — K760 Fatty (change of) liver, not elsewhere classified: Secondary | ICD-10-CM | POA: Diagnosis not present

## 2018-01-19 DIAGNOSIS — M5136 Other intervertebral disc degeneration, lumbar region: Secondary | ICD-10-CM | POA: Diagnosis not present

## 2018-01-19 DIAGNOSIS — D869 Sarcoidosis, unspecified: Secondary | ICD-10-CM | POA: Diagnosis not present

## 2018-01-19 DIAGNOSIS — Z79899 Other long term (current) drug therapy: Secondary | ICD-10-CM | POA: Diagnosis not present

## 2018-01-19 DIAGNOSIS — R1032 Left lower quadrant pain: Secondary | ICD-10-CM | POA: Diagnosis not present

## 2018-01-19 DIAGNOSIS — M545 Low back pain: Secondary | ICD-10-CM | POA: Diagnosis not present

## 2018-01-19 DIAGNOSIS — Z7984 Long term (current) use of oral hypoglycemic drugs: Secondary | ICD-10-CM | POA: Diagnosis not present

## 2018-01-19 DIAGNOSIS — E7800 Pure hypercholesterolemia, unspecified: Secondary | ICD-10-CM | POA: Diagnosis not present

## 2018-01-19 DIAGNOSIS — I10 Essential (primary) hypertension: Secondary | ICD-10-CM | POA: Diagnosis not present

## 2018-01-22 DIAGNOSIS — R109 Unspecified abdominal pain: Secondary | ICD-10-CM | POA: Diagnosis not present

## 2018-01-23 DIAGNOSIS — Z6829 Body mass index (BMI) 29.0-29.9, adult: Secondary | ICD-10-CM | POA: Diagnosis not present

## 2018-01-23 DIAGNOSIS — M4807 Spinal stenosis, lumbosacral region: Secondary | ICD-10-CM | POA: Diagnosis not present

## 2018-01-31 DIAGNOSIS — I639 Cerebral infarction, unspecified: Secondary | ICD-10-CM

## 2018-01-31 HISTORY — DX: Cerebral infarction, unspecified: I63.9

## 2018-02-02 DIAGNOSIS — E860 Dehydration: Secondary | ICD-10-CM | POA: Diagnosis not present

## 2018-02-02 DIAGNOSIS — E785 Hyperlipidemia, unspecified: Secondary | ICD-10-CM | POA: Diagnosis not present

## 2018-02-02 DIAGNOSIS — R1033 Periumbilical pain: Secondary | ICD-10-CM | POA: Diagnosis not present

## 2018-02-02 DIAGNOSIS — E119 Type 2 diabetes mellitus without complications: Secondary | ICD-10-CM | POA: Diagnosis not present

## 2018-02-02 DIAGNOSIS — K449 Diaphragmatic hernia without obstruction or gangrene: Secondary | ICD-10-CM | POA: Diagnosis not present

## 2018-02-02 DIAGNOSIS — Z7951 Long term (current) use of inhaled steroids: Secondary | ICD-10-CM | POA: Diagnosis not present

## 2018-02-02 DIAGNOSIS — D869 Sarcoidosis, unspecified: Secondary | ICD-10-CM | POA: Diagnosis not present

## 2018-02-02 DIAGNOSIS — J4540 Moderate persistent asthma, uncomplicated: Secondary | ICD-10-CM | POA: Diagnosis not present

## 2018-02-02 DIAGNOSIS — K529 Noninfective gastroenteritis and colitis, unspecified: Secondary | ICD-10-CM | POA: Diagnosis not present

## 2018-02-02 DIAGNOSIS — Z7984 Long term (current) use of oral hypoglycemic drugs: Secondary | ICD-10-CM | POA: Diagnosis not present

## 2018-02-02 DIAGNOSIS — L0882 Omphalitis not of newborn: Secondary | ICD-10-CM | POA: Diagnosis not present

## 2018-02-02 DIAGNOSIS — Z79899 Other long term (current) drug therapy: Secondary | ICD-10-CM | POA: Diagnosis not present

## 2018-02-03 DIAGNOSIS — D869 Sarcoidosis, unspecified: Secondary | ICD-10-CM | POA: Diagnosis not present

## 2018-02-03 DIAGNOSIS — K449 Diaphragmatic hernia without obstruction or gangrene: Secondary | ICD-10-CM | POA: Diagnosis not present

## 2018-02-03 DIAGNOSIS — E119 Type 2 diabetes mellitus without complications: Secondary | ICD-10-CM | POA: Diagnosis not present

## 2018-02-03 DIAGNOSIS — R1033 Periumbilical pain: Secondary | ICD-10-CM | POA: Diagnosis not present

## 2018-02-03 DIAGNOSIS — K529 Noninfective gastroenteritis and colitis, unspecified: Secondary | ICD-10-CM | POA: Diagnosis not present

## 2018-02-03 DIAGNOSIS — E860 Dehydration: Secondary | ICD-10-CM | POA: Diagnosis not present

## 2018-02-04 DIAGNOSIS — E119 Type 2 diabetes mellitus without complications: Secondary | ICD-10-CM | POA: Diagnosis not present

## 2018-02-04 DIAGNOSIS — D869 Sarcoidosis, unspecified: Secondary | ICD-10-CM | POA: Diagnosis not present

## 2018-02-04 DIAGNOSIS — E860 Dehydration: Secondary | ICD-10-CM | POA: Diagnosis not present

## 2018-02-04 DIAGNOSIS — K529 Noninfective gastroenteritis and colitis, unspecified: Secondary | ICD-10-CM | POA: Diagnosis not present

## 2018-02-04 DIAGNOSIS — K449 Diaphragmatic hernia without obstruction or gangrene: Secondary | ICD-10-CM | POA: Diagnosis not present

## 2018-02-05 DIAGNOSIS — E860 Dehydration: Secondary | ICD-10-CM | POA: Diagnosis not present

## 2018-02-05 DIAGNOSIS — K529 Noninfective gastroenteritis and colitis, unspecified: Secondary | ICD-10-CM | POA: Diagnosis not present

## 2018-02-05 DIAGNOSIS — D869 Sarcoidosis, unspecified: Secondary | ICD-10-CM | POA: Diagnosis not present

## 2018-02-05 DIAGNOSIS — K449 Diaphragmatic hernia without obstruction or gangrene: Secondary | ICD-10-CM | POA: Diagnosis not present

## 2018-02-05 DIAGNOSIS — E119 Type 2 diabetes mellitus without complications: Secondary | ICD-10-CM | POA: Diagnosis not present

## 2018-02-07 ENCOUNTER — Other Ambulatory Visit: Payer: Self-pay | Admitting: Adult Health

## 2018-02-07 DIAGNOSIS — E1165 Type 2 diabetes mellitus with hyperglycemia: Secondary | ICD-10-CM | POA: Diagnosis not present

## 2018-02-07 DIAGNOSIS — E7849 Other hyperlipidemia: Secondary | ICD-10-CM | POA: Diagnosis not present

## 2018-02-07 DIAGNOSIS — Z1231 Encounter for screening mammogram for malignant neoplasm of breast: Secondary | ICD-10-CM

## 2018-02-07 DIAGNOSIS — I10 Essential (primary) hypertension: Secondary | ICD-10-CM | POA: Diagnosis not present

## 2018-02-09 DIAGNOSIS — I10 Essential (primary) hypertension: Secondary | ICD-10-CM | POA: Diagnosis not present

## 2018-02-09 DIAGNOSIS — R29716 NIHSS score 16: Secondary | ICD-10-CM | POA: Diagnosis not present

## 2018-02-09 DIAGNOSIS — I639 Cerebral infarction, unspecified: Secondary | ICD-10-CM

## 2018-02-09 DIAGNOSIS — I059 Rheumatic mitral valve disease, unspecified: Secondary | ICD-10-CM | POA: Diagnosis not present

## 2018-02-09 DIAGNOSIS — I63411 Cerebral infarction due to embolism of right middle cerebral artery: Secondary | ICD-10-CM | POA: Diagnosis not present

## 2018-02-09 DIAGNOSIS — R471 Dysarthria and anarthria: Secondary | ICD-10-CM | POA: Diagnosis not present

## 2018-02-09 DIAGNOSIS — I6789 Other cerebrovascular disease: Secondary | ICD-10-CM | POA: Diagnosis not present

## 2018-02-09 DIAGNOSIS — Z7984 Long term (current) use of oral hypoglycemic drugs: Secondary | ICD-10-CM | POA: Diagnosis not present

## 2018-02-09 DIAGNOSIS — E7800 Pure hypercholesterolemia, unspecified: Secondary | ICD-10-CM | POA: Diagnosis not present

## 2018-02-09 DIAGNOSIS — R29810 Facial weakness: Secondary | ICD-10-CM | POA: Diagnosis not present

## 2018-02-09 DIAGNOSIS — R531 Weakness: Secondary | ICD-10-CM | POA: Diagnosis not present

## 2018-02-09 DIAGNOSIS — G8314 Monoplegia of lower limb affecting left nondominant side: Secondary | ICD-10-CM | POA: Diagnosis not present

## 2018-02-09 DIAGNOSIS — G8194 Hemiplegia, unspecified affecting left nondominant side: Secondary | ICD-10-CM | POA: Diagnosis not present

## 2018-02-09 DIAGNOSIS — Z87891 Personal history of nicotine dependence: Secondary | ICD-10-CM | POA: Diagnosis not present

## 2018-02-09 DIAGNOSIS — I63512 Cerebral infarction due to unspecified occlusion or stenosis of left middle cerebral artery: Secondary | ICD-10-CM | POA: Diagnosis not present

## 2018-02-09 DIAGNOSIS — Z79899 Other long term (current) drug therapy: Secondary | ICD-10-CM | POA: Diagnosis not present

## 2018-02-09 DIAGNOSIS — D72829 Elevated white blood cell count, unspecified: Secondary | ICD-10-CM | POA: Diagnosis not present

## 2018-02-09 DIAGNOSIS — I63511 Cerebral infarction due to unspecified occlusion or stenosis of right middle cerebral artery: Secondary | ICD-10-CM | POA: Diagnosis not present

## 2018-02-09 DIAGNOSIS — I6389 Other cerebral infarction: Secondary | ICD-10-CM | POA: Diagnosis not present

## 2018-02-09 DIAGNOSIS — D649 Anemia, unspecified: Secondary | ICD-10-CM | POA: Diagnosis not present

## 2018-02-09 DIAGNOSIS — I69391 Dysphagia following cerebral infarction: Secondary | ICD-10-CM | POA: Diagnosis not present

## 2018-02-09 DIAGNOSIS — E119 Type 2 diabetes mellitus without complications: Secondary | ICD-10-CM | POA: Diagnosis not present

## 2018-02-09 DIAGNOSIS — R29818 Other symptoms and signs involving the nervous system: Secondary | ICD-10-CM | POA: Diagnosis not present

## 2018-02-09 DIAGNOSIS — W19XXXA Unspecified fall, initial encounter: Secondary | ICD-10-CM | POA: Diagnosis not present

## 2018-02-09 DIAGNOSIS — I6350 Cerebral infarction due to unspecified occlusion or stenosis of unspecified cerebral artery: Secondary | ICD-10-CM | POA: Diagnosis not present

## 2018-02-09 DIAGNOSIS — E876 Hypokalemia: Secondary | ICD-10-CM | POA: Diagnosis not present

## 2018-02-09 DIAGNOSIS — I6601 Occlusion and stenosis of right middle cerebral artery: Secondary | ICD-10-CM | POA: Diagnosis not present

## 2018-02-09 DIAGNOSIS — R1310 Dysphagia, unspecified: Secondary | ICD-10-CM | POA: Diagnosis not present

## 2018-02-09 DIAGNOSIS — E785 Hyperlipidemia, unspecified: Secondary | ICD-10-CM | POA: Diagnosis not present

## 2018-02-09 HISTORY — DX: Cerebral infarction, unspecified: I63.9

## 2018-02-10 DIAGNOSIS — D72829 Elevated white blood cell count, unspecified: Secondary | ICD-10-CM | POA: Diagnosis not present

## 2018-02-10 DIAGNOSIS — I059 Rheumatic mitral valve disease, unspecified: Secondary | ICD-10-CM | POA: Diagnosis not present

## 2018-02-10 DIAGNOSIS — I10 Essential (primary) hypertension: Secondary | ICD-10-CM | POA: Diagnosis not present

## 2018-02-10 DIAGNOSIS — E785 Hyperlipidemia, unspecified: Secondary | ICD-10-CM | POA: Diagnosis not present

## 2018-02-10 DIAGNOSIS — I6389 Other cerebral infarction: Secondary | ICD-10-CM | POA: Diagnosis not present

## 2018-02-10 DIAGNOSIS — D649 Anemia, unspecified: Secondary | ICD-10-CM | POA: Diagnosis not present

## 2018-02-10 DIAGNOSIS — I639 Cerebral infarction, unspecified: Secondary | ICD-10-CM | POA: Diagnosis not present

## 2018-02-10 DIAGNOSIS — I63511 Cerebral infarction due to unspecified occlusion or stenosis of right middle cerebral artery: Secondary | ICD-10-CM | POA: Diagnosis not present

## 2018-02-10 DIAGNOSIS — R1310 Dysphagia, unspecified: Secondary | ICD-10-CM | POA: Diagnosis not present

## 2018-02-11 DIAGNOSIS — I63411 Cerebral infarction due to embolism of right middle cerebral artery: Secondary | ICD-10-CM | POA: Diagnosis not present

## 2018-02-11 DIAGNOSIS — G8194 Hemiplegia, unspecified affecting left nondominant side: Secondary | ICD-10-CM | POA: Diagnosis not present

## 2018-02-11 DIAGNOSIS — D649 Anemia, unspecified: Secondary | ICD-10-CM | POA: Diagnosis not present

## 2018-02-11 DIAGNOSIS — I6389 Other cerebral infarction: Secondary | ICD-10-CM | POA: Diagnosis not present

## 2018-02-11 DIAGNOSIS — E876 Hypokalemia: Secondary | ICD-10-CM | POA: Diagnosis not present

## 2018-02-11 DIAGNOSIS — I69391 Dysphagia following cerebral infarction: Secondary | ICD-10-CM | POA: Diagnosis not present

## 2018-02-11 DIAGNOSIS — E7800 Pure hypercholesterolemia, unspecified: Secondary | ICD-10-CM | POA: Diagnosis not present

## 2018-02-12 DIAGNOSIS — I63411 Cerebral infarction due to embolism of right middle cerebral artery: Secondary | ICD-10-CM | POA: Diagnosis not present

## 2018-02-17 DIAGNOSIS — Z6828 Body mass index (BMI) 28.0-28.9, adult: Secondary | ICD-10-CM | POA: Diagnosis not present

## 2018-02-17 DIAGNOSIS — I69898 Other sequelae of other cerebrovascular disease: Secondary | ICD-10-CM | POA: Diagnosis not present

## 2018-02-17 DIAGNOSIS — I10 Essential (primary) hypertension: Secondary | ICD-10-CM | POA: Diagnosis not present

## 2018-02-19 DIAGNOSIS — I63511 Cerebral infarction due to unspecified occlusion or stenosis of right middle cerebral artery: Secondary | ICD-10-CM | POA: Diagnosis not present

## 2018-02-19 DIAGNOSIS — I10 Essential (primary) hypertension: Secondary | ICD-10-CM | POA: Diagnosis not present

## 2018-02-19 DIAGNOSIS — I480 Paroxysmal atrial fibrillation: Secondary | ICD-10-CM | POA: Diagnosis not present

## 2018-02-20 ENCOUNTER — Ambulatory Visit (HOSPITAL_COMMUNITY): Payer: Medicare HMO

## 2018-02-21 DIAGNOSIS — Z7984 Long term (current) use of oral hypoglycemic drugs: Secondary | ICD-10-CM | POA: Diagnosis not present

## 2018-02-21 DIAGNOSIS — Z7982 Long term (current) use of aspirin: Secondary | ICD-10-CM | POA: Diagnosis not present

## 2018-02-21 DIAGNOSIS — E119 Type 2 diabetes mellitus without complications: Secondary | ICD-10-CM | POA: Diagnosis not present

## 2018-02-21 DIAGNOSIS — E785 Hyperlipidemia, unspecified: Secondary | ICD-10-CM | POA: Diagnosis not present

## 2018-02-21 DIAGNOSIS — Z9181 History of falling: Secondary | ICD-10-CM | POA: Diagnosis not present

## 2018-02-21 DIAGNOSIS — I69328 Other speech and language deficits following cerebral infarction: Secondary | ICD-10-CM | POA: Diagnosis not present

## 2018-02-21 DIAGNOSIS — I10 Essential (primary) hypertension: Secondary | ICD-10-CM | POA: Diagnosis not present

## 2018-02-21 DIAGNOSIS — I69354 Hemiplegia and hemiparesis following cerebral infarction affecting left non-dominant side: Secondary | ICD-10-CM | POA: Diagnosis not present

## 2018-02-24 DIAGNOSIS — I63411 Cerebral infarction due to embolism of right middle cerebral artery: Secondary | ICD-10-CM | POA: Diagnosis not present

## 2018-03-04 DIAGNOSIS — I69328 Other speech and language deficits following cerebral infarction: Secondary | ICD-10-CM | POA: Diagnosis not present

## 2018-03-04 DIAGNOSIS — I69354 Hemiplegia and hemiparesis following cerebral infarction affecting left non-dominant side: Secondary | ICD-10-CM | POA: Diagnosis not present

## 2018-03-04 DIAGNOSIS — Z7982 Long term (current) use of aspirin: Secondary | ICD-10-CM | POA: Diagnosis not present

## 2018-03-04 DIAGNOSIS — I10 Essential (primary) hypertension: Secondary | ICD-10-CM | POA: Diagnosis not present

## 2018-03-04 DIAGNOSIS — E785 Hyperlipidemia, unspecified: Secondary | ICD-10-CM | POA: Diagnosis not present

## 2018-03-04 DIAGNOSIS — E119 Type 2 diabetes mellitus without complications: Secondary | ICD-10-CM | POA: Diagnosis not present

## 2018-03-04 DIAGNOSIS — Z9181 History of falling: Secondary | ICD-10-CM | POA: Diagnosis not present

## 2018-03-04 DIAGNOSIS — Z7984 Long term (current) use of oral hypoglycemic drugs: Secondary | ICD-10-CM | POA: Diagnosis not present

## 2018-03-05 DIAGNOSIS — E119 Type 2 diabetes mellitus without complications: Secondary | ICD-10-CM | POA: Diagnosis not present

## 2018-03-05 DIAGNOSIS — I69328 Other speech and language deficits following cerebral infarction: Secondary | ICD-10-CM | POA: Diagnosis not present

## 2018-03-05 DIAGNOSIS — E785 Hyperlipidemia, unspecified: Secondary | ICD-10-CM | POA: Diagnosis not present

## 2018-03-05 DIAGNOSIS — Z9181 History of falling: Secondary | ICD-10-CM | POA: Diagnosis not present

## 2018-03-05 DIAGNOSIS — Z7984 Long term (current) use of oral hypoglycemic drugs: Secondary | ICD-10-CM | POA: Diagnosis not present

## 2018-03-05 DIAGNOSIS — Z7982 Long term (current) use of aspirin: Secondary | ICD-10-CM | POA: Diagnosis not present

## 2018-03-05 DIAGNOSIS — I69354 Hemiplegia and hemiparesis following cerebral infarction affecting left non-dominant side: Secondary | ICD-10-CM | POA: Diagnosis not present

## 2018-03-05 DIAGNOSIS — I10 Essential (primary) hypertension: Secondary | ICD-10-CM | POA: Diagnosis not present

## 2018-03-24 DIAGNOSIS — R000 Tachycardia, unspecified: Secondary | ICD-10-CM | POA: Diagnosis not present

## 2018-03-28 DIAGNOSIS — E119 Type 2 diabetes mellitus without complications: Secondary | ICD-10-CM | POA: Diagnosis not present

## 2018-03-29 DIAGNOSIS — E119 Type 2 diabetes mellitus without complications: Secondary | ICD-10-CM | POA: Diagnosis not present

## 2018-04-07 ENCOUNTER — Other Ambulatory Visit (HOSPITAL_COMMUNITY): Payer: Self-pay | Admitting: Internal Medicine

## 2018-04-07 DIAGNOSIS — I69898 Other sequelae of other cerebrovascular disease: Secondary | ICD-10-CM

## 2018-04-07 DIAGNOSIS — I10 Essential (primary) hypertension: Secondary | ICD-10-CM | POA: Diagnosis not present

## 2018-04-07 DIAGNOSIS — G5622 Lesion of ulnar nerve, left upper limb: Secondary | ICD-10-CM | POA: Diagnosis not present

## 2018-04-07 DIAGNOSIS — E1140 Type 2 diabetes mellitus with diabetic neuropathy, unspecified: Secondary | ICD-10-CM | POA: Diagnosis not present

## 2018-04-07 DIAGNOSIS — I69352 Hemiplegia and hemiparesis following cerebral infarction affecting left dominant side: Secondary | ICD-10-CM | POA: Diagnosis not present

## 2018-04-09 ENCOUNTER — Ambulatory Visit (HOSPITAL_COMMUNITY)
Admission: RE | Admit: 2018-04-09 | Discharge: 2018-04-09 | Disposition: A | Discharge status: Home / Self Care | Payer: Medicare HMO | Source: Ambulatory Visit | Attending: Internal Medicine | Admitting: Internal Medicine

## 2018-04-09 DIAGNOSIS — I6523 Occlusion and stenosis of bilateral carotid arteries: Secondary | ICD-10-CM | POA: Diagnosis not present

## 2018-04-09 DIAGNOSIS — I69898 Other sequelae of other cerebrovascular disease: Secondary | ICD-10-CM | POA: Diagnosis not present

## 2018-04-16 ENCOUNTER — Ambulatory Visit (INDEPENDENT_AMBULATORY_CARE_PROVIDER_SITE_OTHER): Payer: Medicare HMO | Admitting: Adult Health

## 2018-04-16 ENCOUNTER — Encounter: Payer: Self-pay | Admitting: Adult Health

## 2018-04-16 ENCOUNTER — Encounter (HOSPITAL_COMMUNITY): Payer: Self-pay

## 2018-04-16 ENCOUNTER — Other Ambulatory Visit (HOSPITAL_COMMUNITY)
Admission: RE | Admit: 2018-04-16 | Discharge: 2018-04-16 | Disposition: A | Discharge status: Home / Self Care | Payer: Medicare HMO | Source: Ambulatory Visit | Attending: Adult Health | Admitting: Adult Health

## 2018-04-16 ENCOUNTER — Ambulatory Visit (HOSPITAL_COMMUNITY)
Admission: RE | Admit: 2018-04-16 | Discharge: 2018-04-16 | Disposition: A | Discharge status: Home / Self Care | Payer: Medicare HMO | Source: Ambulatory Visit | Attending: Adult Health | Admitting: Adult Health

## 2018-04-16 VITALS — BP 134/79 | HR 108 | Ht 66.0 in | Wt 184.5 lb

## 2018-04-16 DIAGNOSIS — Z1151 Encounter for screening for human papillomavirus (HPV): Secondary | ICD-10-CM | POA: Diagnosis not present

## 2018-04-16 DIAGNOSIS — Z1211 Encounter for screening for malignant neoplasm of colon: Secondary | ICD-10-CM | POA: Diagnosis not present

## 2018-04-16 DIAGNOSIS — Z01419 Encounter for gynecological examination (general) (routine) without abnormal findings: Secondary | ICD-10-CM | POA: Insufficient documentation

## 2018-04-16 DIAGNOSIS — R69 Illness, unspecified: Secondary | ICD-10-CM | POA: Diagnosis not present

## 2018-04-16 DIAGNOSIS — E119 Type 2 diabetes mellitus without complications: Secondary | ICD-10-CM | POA: Diagnosis not present

## 2018-04-16 DIAGNOSIS — Z8673 Personal history of transient ischemic attack (TIA), and cerebral infarction without residual deficits: Secondary | ICD-10-CM | POA: Diagnosis not present

## 2018-04-16 DIAGNOSIS — Z1212 Encounter for screening for malignant neoplasm of rectum: Secondary | ICD-10-CM

## 2018-04-16 DIAGNOSIS — Z1231 Encounter for screening mammogram for malignant neoplasm of breast: Secondary | ICD-10-CM

## 2018-04-16 LAB — HEMOCCULT GUIAC POC 1CARD (OFFICE): Fecal Occult Blood, POC: NEGATIVE

## 2018-04-16 LAB — GLUCOSE, POCT (MANUAL RESULT ENTRY): POC Glucose: 194 mg/dl — AB (ref 70–99)

## 2018-04-16 NOTE — Progress Notes (Signed)
Patient ID: [name], female   DOB: 12-18-1965, 53 y.o.   MRN: [id number] History of Present Illness: [name] is a 53 year old black female, single, in for a well woman gyn exam and pap. She had stroke in November.  PCP is Dr Sherrie Sport.   Current Medications, Allergies, Past Medical History, Past Surgical History, Family History and Social History were reviewed in [employer] record.     Review of Systems:  Patient denies any headaches, hearing loss, fatigue, blurred vision, shortness of breath, chest pain, abdominal pain, problems with bowel movements, urination, or intercourse. No joint pain or mood swings. Has numbness right 5th finger and left hand shakes at times, since stroke. Had condom break, will check GC/CHL on pap.  Physical Exam:BP 134/79 (BP Location: Left Arm, Patient Position: Sitting, Cuff Size: Normal)   Pulse (!) 108   Ht 5\' 6"  (1.676 m)   Wt 184 lb 8 oz (83.7 kg)   BMI 29.78 kg/m  FBS 194. General:  Well developed, well nourished, no acute distress Skin:  Warm and dry Neck:  Midline trachea, normal thyroid, good ROM, no lymphadenopathy Lungs; Clear to auscultation bilaterally Breast:  No dominant palpable mass, retraction, or nipple discharge Cardiovascular: Regular rate and rhythm Abdomen:  Soft, non tender, no hepatosplenomegaly Pelvic:  External genitalia is normal in appearance, no lesions.  The vagina is normal in appearance. Urethra has no lesions or masses. The cervix is bulbous.Pap with HPV and GC.CHL performed.  Uterus is felt to be normal size, shape, and contour.  No adnexal masses or tenderness noted.Bladder is non tender, no masses felt. Rectal: Good sphincter tone, no polyps, or hemorrhoids felt.  Hemoccult negative. Extremities/musculoskeletal:  No swelling or varicosities noted, no clubbing or cyanosis Psych:  No mood changes, alert and cooperative,seems happy PHQ 2 score 0 Fall risk is low.  Examination chaperoned by  Levy Pupa LPN.  Impression: 1. Encounter for gynecological examination with Papanicolaou smear of cervix   2. History of stroke   3. Type 2 diabetes mellitus without complication, without long-term current use of insulin ([place])   4. Screening for colorectal cancer       Plan: Physical in 1 year Pap in 3 if normal Had mammogram today Labs with PCP F/U with PCP in blood sugar and meds  Colonoscopy per GI

## 2018-04-21 DIAGNOSIS — I10 Essential (primary) hypertension: Secondary | ICD-10-CM | POA: Diagnosis not present

## 2018-04-21 DIAGNOSIS — E1165 Type 2 diabetes mellitus with hyperglycemia: Secondary | ICD-10-CM | POA: Diagnosis not present

## 2018-04-21 DIAGNOSIS — E7849 Other hyperlipidemia: Secondary | ICD-10-CM | POA: Diagnosis not present

## 2018-04-22 ENCOUNTER — Telehealth: Payer: Self-pay | Admitting: Adult Health

## 2018-04-22 ENCOUNTER — Encounter: Payer: Self-pay | Admitting: Adult Health

## 2018-04-22 DIAGNOSIS — R69 Illness, unspecified: Secondary | ICD-10-CM | POA: Diagnosis not present

## 2018-04-22 DIAGNOSIS — I10 Essential (primary) hypertension: Secondary | ICD-10-CM | POA: Diagnosis not present

## 2018-04-22 DIAGNOSIS — Z6830 Body mass index (BMI) 30.0-30.9, adult: Secondary | ICD-10-CM | POA: Diagnosis not present

## 2018-04-22 DIAGNOSIS — R87810 Cervical high risk human papillomavirus (HPV) DNA test positive: Secondary | ICD-10-CM

## 2018-04-22 DIAGNOSIS — I69898 Other sequelae of other cerebrovascular disease: Secondary | ICD-10-CM | POA: Diagnosis not present

## 2018-04-22 HISTORY — DX: Cervical high risk human papillomavirus (HPV) DNA test positive: R87.810

## 2018-04-22 NOTE — Telephone Encounter (Signed)
Pt aware that pap is negative for malignancy and GC/CHl but +HPV, repeat pap in 1 year

## 2018-04-23 LAB — CYTOLOGY - PAP
ADEQUACY: ABSENT
Chlamydia: NEGATIVE
Diagnosis: NEGATIVE
HPV (WINDOPATH): DETECTED — AB
NEISSERIA GONORRHEA: NEGATIVE

## 2018-05-10 DIAGNOSIS — J309 Allergic rhinitis, unspecified: Secondary | ICD-10-CM | POA: Diagnosis not present

## 2018-05-10 DIAGNOSIS — E11630 Type 2 diabetes mellitus with periodontal disease: Secondary | ICD-10-CM | POA: Diagnosis not present

## 2018-05-10 DIAGNOSIS — E1142 Type 2 diabetes mellitus with diabetic polyneuropathy: Secondary | ICD-10-CM | POA: Diagnosis not present

## 2018-05-10 DIAGNOSIS — E785 Hyperlipidemia, unspecified: Secondary | ICD-10-CM | POA: Diagnosis not present

## 2018-05-10 DIAGNOSIS — R030 Elevated blood-pressure reading, without diagnosis of hypertension: Secondary | ICD-10-CM | POA: Diagnosis not present

## 2018-05-10 DIAGNOSIS — R69 Illness, unspecified: Secondary | ICD-10-CM | POA: Diagnosis not present

## 2018-05-10 DIAGNOSIS — Z008 Encounter for other general examination: Secondary | ICD-10-CM | POA: Diagnosis not present

## 2018-05-10 DIAGNOSIS — G8929 Other chronic pain: Secondary | ICD-10-CM | POA: Diagnosis not present

## 2018-05-10 DIAGNOSIS — E1151 Type 2 diabetes mellitus with diabetic peripheral angiopathy without gangrene: Secondary | ICD-10-CM | POA: Diagnosis not present

## 2018-05-10 DIAGNOSIS — J449 Chronic obstructive pulmonary disease, unspecified: Secondary | ICD-10-CM | POA: Diagnosis not present

## 2018-05-19 DIAGNOSIS — I10 Essential (primary) hypertension: Secondary | ICD-10-CM | POA: Diagnosis not present

## 2018-05-19 DIAGNOSIS — E1165 Type 2 diabetes mellitus with hyperglycemia: Secondary | ICD-10-CM | POA: Diagnosis not present

## 2018-05-19 DIAGNOSIS — E7849 Other hyperlipidemia: Secondary | ICD-10-CM | POA: Diagnosis not present

## 2018-05-22 DIAGNOSIS — E1140 Type 2 diabetes mellitus with diabetic neuropathy, unspecified: Secondary | ICD-10-CM | POA: Diagnosis not present

## 2018-05-22 DIAGNOSIS — M5412 Radiculopathy, cervical region: Secondary | ICD-10-CM | POA: Diagnosis not present

## 2018-06-09 DIAGNOSIS — H04123 Dry eye syndrome of bilateral lacrimal glands: Secondary | ICD-10-CM | POA: Diagnosis not present

## 2018-06-16 DIAGNOSIS — I10 Essential (primary) hypertension: Secondary | ICD-10-CM | POA: Diagnosis not present

## 2018-06-16 DIAGNOSIS — E1165 Type 2 diabetes mellitus with hyperglycemia: Secondary | ICD-10-CM | POA: Diagnosis not present

## 2018-06-16 DIAGNOSIS — E7849 Other hyperlipidemia: Secondary | ICD-10-CM | POA: Diagnosis not present

## 2018-06-27 DIAGNOSIS — E119 Type 2 diabetes mellitus without complications: Secondary | ICD-10-CM | POA: Diagnosis not present

## 2018-06-28 DIAGNOSIS — E119 Type 2 diabetes mellitus without complications: Secondary | ICD-10-CM | POA: Diagnosis not present

## 2018-07-01 DIAGNOSIS — D869 Sarcoidosis, unspecified: Secondary | ICD-10-CM | POA: Diagnosis not present

## 2018-07-01 DIAGNOSIS — J411 Mucopurulent chronic bronchitis: Secondary | ICD-10-CM | POA: Diagnosis not present

## 2018-07-01 DIAGNOSIS — J301 Allergic rhinitis due to pollen: Secondary | ICD-10-CM | POA: Diagnosis not present

## 2018-07-02 DIAGNOSIS — E1140 Type 2 diabetes mellitus with diabetic neuropathy, unspecified: Secondary | ICD-10-CM | POA: Diagnosis not present

## 2018-07-02 DIAGNOSIS — I69352 Hemiplegia and hemiparesis following cerebral infarction affecting left dominant side: Secondary | ICD-10-CM | POA: Diagnosis not present

## 2018-07-02 DIAGNOSIS — G5622 Lesion of ulnar nerve, left upper limb: Secondary | ICD-10-CM | POA: Diagnosis not present

## 2018-07-02 DIAGNOSIS — I10 Essential (primary) hypertension: Secondary | ICD-10-CM | POA: Diagnosis not present

## 2018-07-07 ENCOUNTER — Other Ambulatory Visit: Payer: Self-pay

## 2018-07-07 ENCOUNTER — Encounter: Payer: Self-pay | Admitting: Nurse Practitioner

## 2018-07-07 ENCOUNTER — Ambulatory Visit (INDEPENDENT_AMBULATORY_CARE_PROVIDER_SITE_OTHER): Payer: Medicare HMO | Admitting: Nurse Practitioner

## 2018-07-07 ENCOUNTER — Encounter: Payer: Self-pay | Admitting: Gastroenterology

## 2018-07-07 DIAGNOSIS — K219 Gastro-esophageal reflux disease without esophagitis: Secondary | ICD-10-CM

## 2018-07-07 DIAGNOSIS — K644 Residual hemorrhoidal skin tags: Secondary | ICD-10-CM | POA: Diagnosis not present

## 2018-07-07 DIAGNOSIS — K5901 Slow transit constipation: Secondary | ICD-10-CM | POA: Diagnosis not present

## 2018-07-07 NOTE — Assessment & Plan Note (Signed)
GERD symptoms currently well managed, continue current medications and follow-up in 6 months.

## 2018-07-07 NOTE — Assessment & Plan Note (Signed)
Constipation doing well, has not required MiraLAX in some time.  She does still have MiraLAX at home and she can use this 1-2 times a day as needed if she does have a flareup of constipation.  Otherwise continue current medications and follow-up in 6 months.

## 2018-07-07 NOTE — Progress Notes (Signed)
Referring Provider: [name], MD Primary Care Physician:  [name], MD Primary GI:  Dr. Oneida Alar  NOTE: Service was provided via telemedicine and was requested by the patient due to COVID-19 pandemic.  Method of visit: telephone  Patient Location: home  Provider Location: office  Reason for Phone Visit: 60-month follow-up  The patient was consented to phone follow-up via telephone encounter including billing of the encounter (yes/no): yes  Persons present on the phone encounter, with roles: oldest son and grandsom  Total time (minutes) spent on medical discussion: 22 minutes  Chief Complaint  Patient presents with  . Follow-up    6 mo f/u, hemorrhoid    HPI:   [name] is a 53 y.o. female who presents for telephone check-in regarding: 9-month follow-up on GERD, constipation, external hemorrhoids.  Patient was last seen in our office 01/02/2018 for the same.  History of H. pylori gastritis status post treatment with Pylera.  Eradication confirmed with H. pylori breath testing.  At her last visit she noted worsening of her symptoms due to significant increase in stress related to son being hospitalized for pneumonia.  Had an episode of diarrhea 4 days prior which caused a flare of her hemorrhoids.  Currently constipated and trying to avoid straining.  GERD symptoms doing okay overall with some breakthrough based on dietary indiscretions.  No other overt GI symptoms.  Recommended MiraLAX 1-2 times a day as needed for constipation, continue other medications, Anusol rectal cream refilled, follow-up in 6 months.  Today she states she's doing well. GERD is doing well overall, flare-ups intermittently wih dietary indiscretions. Had a CVA 02/09/18 with no residual deficits. Constipation doing well, hasn't needed MiraLAX in some time. Hemorrhoid doing well. Denies hematochezia, melena, abdominal pain, N/V, fever, chills, unintentional weight loss. Denies URI or flu-like  symptoms. Denies chest pain, dyspnea, dizziness, lightheadedness, syncope, near syncope. Denies any other upper or lower GI symptoms.  Past Medical History:  Diagnosis Date  . Abnormal uterine bleeding (AUB) 11/26/2012  . 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 Asthma   . BV (bacterial vaginosis) 11/26/2012  . Constipation   . CVA (cerebral vascular accident) ([place]) 02/09/2018  . Diabetes mellitus   . Fatigue 12/24/2012  . Fibroids 01/01/2013  . GERD (gastroesophageal reflux disease)   . Helicobacter pylori gastritis JAN 2017   EGD Bx  . Hematuria 04/05/2015  . Hemorrhoid 01/01/2013  . Hemorrhoids 04/05/2015  . Hypertension   . IBS (irritable bowel syndrome)   . Neuropathy due to secondary diabetes ([place])   . Other and unspecified ovarian cyst 01/01/2013  . Papanicolaou smear of cervix with positive high risk human papilloma virus (HPV) test 04/22/2018   Repeat in 1 year  . Sarcoidosis   . Stroke ([place]) 01/2018  . Vaginal discharge 02/12/2013   +yeast  . Vaginal itching 02/14/2015  . Vaginal odor 04/05/2015    Past Surgical History:  Procedure Laterality Date  . BACTERIAL OVERGROWTH TEST N/A 10/21/2015   Procedure: BACTERIAL OVERGROWTH TEST;  Surgeon: Danie Binder, MD;  [facility];  Service: Endoscopy;  Laterality: N/A;  0800  . CHOLECYSTECTOMY    . COLONOSCOPY N/A 08/20/2013   Dr. Oneida Alar: normal mucosa in terminal ileum, mild diverticulosis in ascending colon, moderate sized hemorrhoids s/p banding  . ENDOMETRIAL ABLATION    . ESOPHAGOGASTRODUODENOSCOPY N/A 04/13/2015   SLF: 1. FEDA due ot erosive gastriitis/ rectal bleeding   . FLEXIBLE SIGMOIDOSCOPY N/A 04/13/2015   SLF: 1.  Rectal bleeding due to moderate sized internal hemorrhoids  . HEMORRHOID BANDING  08/20/2013   Procedure: HEMORRHOID BANDING;  Surgeon: Danie Binder, MD;  [facility];  Service: Endoscopy;;  . HEMORRHOID BANDING  04/13/2015   Procedure: Thayer Jew;  Surgeon: Danie Binder, MD;   [facility];  Service: Endoscopy;;  . HEMORRHOID SURGERY     banding  . HEMORRHOID SURGERY N/A 12/25/2013   Procedure: EXTENSIVE HEMORRHOIDECTOMY;  Surgeon: Jamesetta So, MD;  Location: [facility];  Service: General;  Laterality: N/A;  . SMART PILL PROCEDURE N/A 09/19/2015   Procedure: SMART PILL PROCEDURE;  Surgeon: Danie Binder, MD;  [facility]: [facility];  Service: Endoscopy;  Laterality: N/A;  0800  . TUBAL LIGATION      Current Outpatient Medications  Medication Sig Dispense Refill  . aspirin 325 MG tablet Take 325 mg by mouth daily.    Marland Kitchen atorvastatin (LIPITOR) 20 MG tablet Take 20 mg by mouth at bedtime.     . fenofibrate (TRICOR) 145 MG tablet Take 145 mg by mouth at bedtime.     . metFORMIN (GLUCOPHAGE) 1000 MG tablet Take 1,000 mg by mouth 2 (two) times daily with a meal.     . omeprazole (PRILOSEC) 20 MG capsule TAKE ONE CAPSULE BY MOUTH TWICE DAILY FOR 3 MONTHS, THEN TAKE ONE ONCE DAILY 60 capsule 5   No current facility-administered medications for this visit.     Allergies as of 07/07/2018 - Review Complete 07/07/2018  Allergen Reaction Noted  . Shellfish allergy Anaphylaxis 12/15/2011  . Other Itching 07/14/2015    Family History  Problem Relation Age of Onset  . Diabetes Father   . Cancer Mother        throat  . Cancer Brother        lung  . Breast cancer Cousin   . Cancer Maternal Aunt   . Diabetes Maternal Aunt   . Colon cancer Neg Hx     Social History   Socioeconomic History  . Marital status: Single    Spouse name: Not on file  . Number of children: 2  . Years of education: Not on file  . Highest education level: Not on file  Occupational History    Employer: UNEMPLOYED  Social Needs  . Financial resource strain: Not on file  . Food insecurity:    Worry: Not on file    Inability: Not on file  . Transportation needs:    Medical: Not on file    Non-medical: Not on file  Tobacco Use  . Smoking status: Former Smoker    Packs/day:  1.00    Years: 6.00    Pack years: 6.00    Types: Cigarettes    Last attempt to quit: 12/22/2003    Years since quitting: 14.5  . Smokeless tobacco: Never Used  . Tobacco comment: Quit  5 years  Substance and Sexual Activity  . Alcohol use: Yes    Comment: "once in a while"  . Drug use: No  . Sexual activity: Not Currently    Birth control/protection: Surgical    Comment: tubal and ablation  Lifestyle  . Physical activity:    Days per week: Not on file    Minutes per session: Not on file  . Stress: Not on file  Relationships  . Social connections:    Talks on phone: Not on file    Gets together: Not on file    Attends religious service:  Not on file    Active member of club or organization: Not on file    Attends meetings of clubs or organizations: Not on file    Relationship status: Not on file  Other Topics Concern  . Not on file  Social History Narrative  . Not on file    Review of Systems: Complete ROS negative except as per HPI.  Physical Exam: Note: limited exam due to virtual visit General:   Alert and oriented. Pleasant and cooperative. Ears:  Normal auditory acuity. Neurologic:  Alert and oriented x4 Psych:  Alert and cooperative. Normal mood and affect.

## 2018-07-07 NOTE — Patient Instructions (Signed)
Your health issues we discussed today were:   GERD (heartburn): 1. Continue medications 2. Avoid triggers that tend to make your symptoms worse 3. Call us if you have any severe worsening  Constipation: 1. You can use MiraLAX 1-2 times a day as needed for any breakthrough constipation 2. Call us if you have severe worsening, or see any blood in your stools  Hemorrhoids: 1. Use Anusol rectal cream for any flareups of your hemorrhoids 2. Call us if you need a refill and we can send it to your pharmacy  Overall I recommend:  1. Call us if you have any questions or concerns 2. Follow-up in 6 months    Because of recent events of COVID-19 ("Coronavirus"), follow CDC recommendations:  1. Wash your hand frequently 2. Avoid touching your face 3. Stay away from people who are sick 4. If you have symptoms such as fever, cough, shortness of breath then call your healthcare provider for further guidance 5. If you are sick, STAY AT HOME unless otherwise directed by your healthcare provider. 6. Follow directions from state and national officials regarding staying safe    At [facility] we value your feedback. You may receive a survey about your visit today. Please share your experience as we strive to create trusting relationships with our patients to provide genuine, compassionate, quality care.  We appreciate your understanding and patience as we review any laboratory studies, imaging, and other diagnostic tests that are ordered as we care for you. Our office policy is 5 business days for review of these results, and any emergent or urgent results are addressed in a timely manner for your best interest. If you do not hear from our office in 1 week, please contact us.   We also encourage the use of MyChart, which contains your medical information for your review as well. If you are not enrolled in this feature, an access code is on this after visit summary for your convenience.  Thank you for allowing [place] to be involved in your care.  It was great to see you today!  I hope you have a great day!!

## 2018-07-07 NOTE — Progress Notes (Signed)
cc'ed to pcp °

## 2018-07-07 NOTE — Assessment & Plan Note (Signed)
Currently well managed, no recent flareups.  This is likely due to no recent constipation.  Use Anusol as needed, follow-up in 6 months.

## 2018-07-10 DIAGNOSIS — I10 Essential (primary) hypertension: Secondary | ICD-10-CM | POA: Diagnosis not present

## 2018-07-10 DIAGNOSIS — E1165 Type 2 diabetes mellitus with hyperglycemia: Secondary | ICD-10-CM | POA: Diagnosis not present

## 2018-07-10 DIAGNOSIS — E7849 Other hyperlipidemia: Secondary | ICD-10-CM | POA: Diagnosis not present

## 2018-07-29 DIAGNOSIS — Z6829 Body mass index (BMI) 29.0-29.9, adult: Secondary | ICD-10-CM | POA: Diagnosis not present

## 2018-07-29 DIAGNOSIS — I679 Cerebrovascular disease, unspecified: Secondary | ICD-10-CM | POA: Diagnosis not present

## 2018-07-29 DIAGNOSIS — E119 Type 2 diabetes mellitus without complications: Secondary | ICD-10-CM | POA: Diagnosis not present

## 2018-07-29 DIAGNOSIS — I69898 Other sequelae of other cerebrovascular disease: Secondary | ICD-10-CM | POA: Diagnosis not present

## 2018-07-29 DIAGNOSIS — E7849 Other hyperlipidemia: Secondary | ICD-10-CM | POA: Diagnosis not present

## 2018-07-29 DIAGNOSIS — I10 Essential (primary) hypertension: Secondary | ICD-10-CM | POA: Diagnosis not present

## 2018-07-29 DIAGNOSIS — J040 Acute laryngitis: Secondary | ICD-10-CM | POA: Diagnosis not present

## 2018-07-29 DIAGNOSIS — Z0000 Encounter for general adult medical examination without abnormal findings: Secondary | ICD-10-CM | POA: Diagnosis not present

## 2018-07-29 DIAGNOSIS — R69 Illness, unspecified: Secondary | ICD-10-CM | POA: Diagnosis not present

## 2018-07-29 DIAGNOSIS — Z1389 Encounter for screening for other disorder: Secondary | ICD-10-CM | POA: Diagnosis not present

## 2018-08-28 DIAGNOSIS — E119 Type 2 diabetes mellitus without complications: Secondary | ICD-10-CM | POA: Diagnosis not present

## 2018-08-28 DIAGNOSIS — R69 Illness, unspecified: Secondary | ICD-10-CM | POA: Diagnosis not present

## 2018-08-28 DIAGNOSIS — E7849 Other hyperlipidemia: Secondary | ICD-10-CM | POA: Diagnosis not present

## 2018-08-28 DIAGNOSIS — I10 Essential (primary) hypertension: Secondary | ICD-10-CM | POA: Diagnosis not present

## 2018-09-11 DIAGNOSIS — I10 Essential (primary) hypertension: Secondary | ICD-10-CM | POA: Diagnosis not present

## 2018-09-11 DIAGNOSIS — E7849 Other hyperlipidemia: Secondary | ICD-10-CM | POA: Diagnosis not present

## 2018-09-11 DIAGNOSIS — E119 Type 2 diabetes mellitus without complications: Secondary | ICD-10-CM | POA: Diagnosis not present

## 2018-09-11 DIAGNOSIS — R69 Illness, unspecified: Secondary | ICD-10-CM | POA: Diagnosis not present

## 2018-10-08 DIAGNOSIS — I10 Essential (primary) hypertension: Secondary | ICD-10-CM | POA: Diagnosis not present

## 2018-10-08 DIAGNOSIS — R69 Illness, unspecified: Secondary | ICD-10-CM | POA: Diagnosis not present

## 2018-10-08 DIAGNOSIS — E7849 Other hyperlipidemia: Secondary | ICD-10-CM | POA: Diagnosis not present

## 2018-10-08 DIAGNOSIS — E119 Type 2 diabetes mellitus without complications: Secondary | ICD-10-CM | POA: Diagnosis not present

## 2018-10-20 ENCOUNTER — Emergency Department (HOSPITAL_COMMUNITY)
Admission: EM | Admit: 2018-10-20 | Discharge: 2018-10-20 | Disposition: A | Discharge status: Home / Self Care | Payer: Medicare HMO | Attending: Emergency Medicine | Admitting: Emergency Medicine

## 2018-10-20 ENCOUNTER — Other Ambulatory Visit: Payer: Self-pay

## 2018-10-20 ENCOUNTER — Encounter (HOSPITAL_COMMUNITY): Payer: Self-pay

## 2018-10-20 ENCOUNTER — Emergency Department (HOSPITAL_COMMUNITY): Payer: Medicare HMO

## 2018-10-20 DIAGNOSIS — Z7982 Long term (current) use of aspirin: Secondary | ICD-10-CM | POA: Insufficient documentation

## 2018-10-20 DIAGNOSIS — R197 Diarrhea, unspecified: Secondary | ICD-10-CM | POA: Diagnosis not present

## 2018-10-20 DIAGNOSIS — J45909 Unspecified asthma, uncomplicated: Secondary | ICD-10-CM | POA: Diagnosis not present

## 2018-10-20 DIAGNOSIS — K4020 Bilateral inguinal hernia, without obstruction or gangrene, not specified as recurrent: Secondary | ICD-10-CM | POA: Diagnosis not present

## 2018-10-20 DIAGNOSIS — E1140 Type 2 diabetes mellitus with diabetic neuropathy, unspecified: Secondary | ICD-10-CM | POA: Insufficient documentation

## 2018-10-20 DIAGNOSIS — Z79899 Other long term (current) drug therapy: Secondary | ICD-10-CM | POA: Diagnosis not present

## 2018-10-20 DIAGNOSIS — R630 Anorexia: Secondary | ICD-10-CM | POA: Diagnosis not present

## 2018-10-20 DIAGNOSIS — Z87891 Personal history of nicotine dependence: Secondary | ICD-10-CM | POA: Insufficient documentation

## 2018-10-20 DIAGNOSIS — R1031 Right lower quadrant pain: Secondary | ICD-10-CM | POA: Diagnosis not present

## 2018-10-20 DIAGNOSIS — Z8673 Personal history of transient ischemic attack (TIA), and cerebral infarction without residual deficits: Secondary | ICD-10-CM | POA: Diagnosis not present

## 2018-10-20 DIAGNOSIS — Z7984 Long term (current) use of oral hypoglycemic drugs: Secondary | ICD-10-CM | POA: Diagnosis not present

## 2018-10-20 LAB — URINALYSIS, ROUTINE W REFLEX MICROSCOPIC
Bilirubin Urine: NEGATIVE
Glucose, UA: NEGATIVE mg/dL
Hgb urine dipstick: NEGATIVE
Ketones, ur: NEGATIVE mg/dL
Leukocytes,Ua: NEGATIVE
Nitrite: NEGATIVE
Protein, ur: NEGATIVE mg/dL
Specific Gravity, Urine: 1.003 — ABNORMAL LOW (ref 1.005–1.030)
pH: 5 (ref 5.0–8.0)

## 2018-10-20 LAB — CBC
HCT: 36.3 % (ref 36.0–46.0)
Hemoglobin: 11.5 g/dL — ABNORMAL LOW (ref 12.0–15.0)
MCH: 28.3 pg (ref 26.0–34.0)
MCHC: 31.7 g/dL (ref 30.0–36.0)
MCV: 89.4 fL (ref 80.0–100.0)
Platelets: 287 10*3/uL (ref 150–400)
RBC: 4.06 MIL/uL (ref 3.87–5.11)
RDW: 14.9 % (ref 11.5–15.5)
WBC: 8 10*3/uL (ref 4.0–10.5)
nRBC: 0 % (ref 0.0–0.2)

## 2018-10-20 LAB — COMPREHENSIVE METABOLIC PANEL
ALT: 24 U/L (ref 0–44)
AST: 23 U/L (ref 15–41)
Albumin: 4.4 g/dL (ref 3.5–5.0)
Alkaline Phosphatase: 61 U/L (ref 38–126)
Anion gap: 10 (ref 5–15)
BUN: 19 mg/dL (ref 6–20)
CO2: 25 mmol/L (ref 22–32)
Calcium: 9.8 mg/dL (ref 8.9–10.3)
Chloride: 106 mmol/L (ref 98–111)
Creatinine, Ser: 0.98 mg/dL (ref 0.44–1.00)
GFR calc Af Amer: >60 mL/min (ref >60)
GFR calc non Af Amer: >60 mL/min (ref >60)
Glucose, Bld: 96 mg/dL (ref 70–99)
Potassium: 3.8 mmol/L (ref 3.5–5.1)
Sodium: 141 mmol/L (ref 135–145)
Total Bilirubin: 0.4 mg/dL (ref 0.3–1.2)
Total Protein: 8.5 g/dL — ABNORMAL HIGH (ref 6.5–8.1)

## 2018-10-20 LAB — LIPASE, BLOOD: Lipase: 32 U/L (ref 11–51)

## 2018-10-20 MED ORDER — POLYETHYLENE GLYCOL 3350 17 G PO PACK: 17.0000 g | PACK | Freq: Every day | ORAL | 0 refills | Status: DC | PRN

## 2018-10-20 MED ORDER — SODIUM CHLORIDE 0.9% FLUSH: 3.0000 mL | Freq: Once | INTRAVENOUS | Status: DC

## 2018-10-20 MED ORDER — SODIUM CHLORIDE 0.9 % IV BOLUS
500.0000 mL | Freq: Once | INTRAVENOUS | Status: AC
  Administered 2018-10-20: 500 mL via INTRAVENOUS

## 2018-10-20 MED ORDER — IOHEXOL 300 MG/ML  SOLN
100.0000 mL | Freq: Once | INTRAMUSCULAR | Status: AC | PRN
  Administered 2018-10-20: 100 mL via INTRAVENOUS

## 2018-10-20 NOTE — ED Provider Notes (Signed)
[facility] EMERGENCY DEPARTMENT Provider Note   CSN: [id number] Arrival date & time: 10/20/18  1122    History   Chief Complaint Chief Complaint  Patient presents with   Abdominal Pain    HPI [name] is a 53 y.o. female.     HPI Patient presents with abdominal pain.  Began a couple days ago.  In the lower abdomen.  Had some diarrhea.  No dysuria.  No fevers or chills.  Pain is in the right lower quadrant and goes to the lower abdomen.  No nausea or vomiting.  Decreased appetite.  Has not eaten today.  No vaginal bleeding or discharge. 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   Asthma    BV (bacterial vaginosis) 11/26/2012   Constipation    CVA (cerebral vascular accident) ([place]) 02/09/2018   Diabetes mellitus    Fatigue 12/24/2012   Fibroids 01/01/2013   GERD (gastroesophageal reflux disease)    Helicobacter pylori gastritis JAN 2017   EGD Bx   Hematuria 04/05/2015   Hemorrhoid 01/01/2013   Hemorrhoids 04/05/2015   Hypertension    IBS (irritable bowel syndrome)    Neuropathy due to secondary diabetes ([place])    Other and unspecified ovarian cyst 01/01/2013   Papanicolaou smear of cervix with positive high risk human papilloma virus (HPV) test 04/22/2018   Repeat in 1 year   Sarcoidosis    Stroke ([place]) 01/2018   Vaginal discharge 02/12/2013   +yeast   Vaginal itching 02/14/2015   Vaginal odor 04/05/2015    Patient Active Problem List   Diagnosis Date Noted   Papanicolaou smear of cervix with positive high risk human papilloma virus (HPV) test 04/22/2018   Encounter for gynecological examination with Papanicolaou smear of cervix 04/16/2018   History of stroke 04/16/2018   Type 2 diabetes mellitus without complication, without long-term current use of insulin ([place]) 04/16/2018   Constipation 01/02/2018   Screening for colorectal cancer 04/10/2017    Encounter for well woman exam with routine gynecological exam 04/10/2017   Anxiety and depression 04/10/2017   Feeling stressed out 04/10/2017   External hemorrhoids with complication 00/17/4944   Yeast vaginitis 96/75/9163   Helicobacter pylori gastritis 07/14/2015   BV (bacterial vaginosis) 02/14/2015   Constipation by delayed colonic transit 11/11/2014   Internal hemorrhoids with complication 84/66/5993   GERD (gastroesophageal reflux disease) 01/14/2013    Past Surgical History:  Procedure Laterality Date   BACTERIAL OVERGROWTH TEST N/A 10/21/2015   Procedure: BACTERIAL OVERGROWTH TEST;  Surgeon: Danie Binder, MD;  [facility];  Service: Endoscopy;  Laterality: N/A;  0800   CHOLECYSTECTOMY     COLONOSCOPY N/A 08/20/2013   Dr. Oneida Alar: normal mucosa in terminal ileum, mild diverticulosis in ascending colon, moderate sized hemorrhoids s/p banding   ENDOMETRIAL ABLATION     ESOPHAGOGASTRODUODENOSCOPY N/A 04/13/2015   SLF: 1. FEDA due ot erosive gastriitis/ rectal bleeding    FLEXIBLE SIGMOIDOSCOPY N/A 04/13/2015   SLF: 1. Rectal bleeding due to moderate sized internal hemorrhoids   HEMORRHOID BANDING  08/20/2013   Procedure: HEMORRHOID BANDING;  Surgeon: Danie Binder, MD;  [facility];  Service: Endoscopy;;   HEMORRHOID BANDING  04/13/2015   Procedure: Thayer Jew;  Surgeon: Danie Binder, MD;  [facility];  Service: Endoscopy;;   HEMORRHOID SURGERY     banding   HEMORRHOID SURGERY N/A 12/25/2013   Procedure: EXTENSIVE HEMORRHOIDECTOMY;  Surgeon: Jamesetta So, MD;  Location: [facility];  Service: General;  Laterality: N/A;   SMART PILL PROCEDURE N/A 09/19/2015   Procedure: SMART PILL PROCEDURE;  Surgeon: Danie Binder, MD;  [facility];  Service: Endoscopy;  Laterality: N/A;  0800   TUBAL LIGATION       OB History    Gravida  6   Para  2   Term      Preterm      AB  4   Living  2     SAB  2    TAB      Ectopic      Multiple      Live Births  2            Home Medications    Prior to Admission medications   Medication Sig Start Date End Date Taking? Authorizing Provider  albuterol (VENTOLIN HFA) 108 (90 Base) MCG/ACT inhaler Inhale 1-2 puffs into the lungs every 6 (six) hours as needed for wheezing or shortness of breath.   Yes [provider]  amLODipine (NORVASC) 5 MG tablet Take 5 mg by mouth daily. 05/15/18  Yes [provider]  aspirin 325 MG tablet Take 325 mg by mouth daily.   Yes [provider]  atorvastatin (LIPITOR) 40 MG tablet Take 40 mg by mouth at bedtime. 05/14/18  Yes [provider]  DULoxetine (CYMBALTA) 60 MG capsule Take 60 mg by mouth daily. 06/03/18  Yes [provider]  famotidine (PEPCID) 20 MG tablet Take 20 mg by mouth 2 (two) times daily.   Yes [provider]  fenofibrate 160 MG tablet Take 160 mg by mouth daily. 06/03/18  Yes [provider]  gabapentin (NEURONTIN) 300 MG capsule Take 300 mg by mouth daily. 06/10/18  Yes [provider]  metFORMIN (GLUCOPHAGE) 1000 MG tablet Take 1,000 mg by mouth 2 (two) times daily with a meal.   Yes [provider]  Tiotropium Bromide Monohydrate (SPIRIVA RESPIMAT) 2.5 MCG/ACT AERS Inhale 2 puffs into the lungs daily.   Yes [provider]  omeprazole (PRILOSEC) 20 MG capsule TAKE ONE CAPSULE BY MOUTH TWICE DAILY FOR 3 MONTHS, THEN TAKE ONE ONCE DAILY Patient not taking: Reported on 10/20/2018 03/21/16   Annitta Needs, NP  polyethylene glycol (MIRALAX) 17 g packet Take 17 g by mouth daily as needed. 10/20/18   Davonna Belling, MD    Family History Family History  Problem Relation Age of Onset   Diabetes Father    Cancer Mother        throat   Cancer Brother        lung   Breast cancer Cousin    Cancer Maternal Aunt    Diabetes Maternal Aunt    Colon cancer Neg Hx     Social History Social History    Tobacco Use   Smoking status: Former Smoker    Packs/day: 1.00    Years: 6.00    Pack years: 6.00    Types: Cigarettes    Quit date: 12/22/2003    Years since quitting: 14.8   Smokeless tobacco: Never Used   Tobacco comment: Quit  5 years  Substance Use Topics   Alcohol use: Yes    Comment: "once in a while"   Drug use: No     Allergies   Shellfish allergy and Other   Review of Systems Review of Systems  Constitutional: Positive for appetite change. Negative for fever.  HENT: Negative for congestion.   Respiratory: Negative for shortness of breath.   Cardiovascular: Negative for chest pain.  Gastrointestinal: Positive for abdominal pain and diarrhea. Negative for constipation.  Genitourinary: Negative for dysuria and flank pain.  Musculoskeletal: Negative for back pain.  Skin: Negative for rash.  Neurological: Negative for weakness.  Psychiatric/Behavioral: Negative for confusion.     Physical Exam Updated Vital Signs BP 128/88    Pulse (!) 104    Temp 98.1 F (36.7 C) (Oral)    Resp 16    Ht 5\' 6"  (1.676 m)    Wt 85.7 kg    SpO2 100%    BMI 30.51 kg/m   Physical Exam Vitals signs and nursing note reviewed.  HENT:     Head: Normocephalic.  Eyes:     Pupils: Pupils are equal, round, and reactive to light.  Cardiovascular:     Rate and Rhythm: Normal rate and regular rhythm.  Pulmonary:     Breath sounds: Normal breath sounds.  Abdominal:     Comments: Right lower quadrant tenderness without rebound or guarding.  Skin:    General: Skin is warm.     Capillary Refill: Capillary refill takes less than 2 seconds.  Neurological:     Mental Status: She is alert and oriented to person, place, and time.      ED Treatments / Results  Labs (all labs ordered are listed, but only abnormal results are displayed) Labs Reviewed  COMPREHENSIVE METABOLIC PANEL - Abnormal; Notable for the following components:      Result Value   Total Protein 8.5 (*)    All  other components within normal limits  CBC - Abnormal; Notable for the following components:   Hemoglobin 11.5 (*)    All other components within normal limits  URINALYSIS, ROUTINE W REFLEX MICROSCOPIC - Abnormal; Notable for the following components:   Color, Urine STRAW (*)    Specific Gravity, Urine 1.003 (*)    All other components within normal limits  LIPASE, BLOOD    EKG None  Radiology Ct Abdomen Pelvis W Contrast  Result Date: 10/20/2018 CLINICAL DATA:  LEFT LOWER QUADRANT abdominal pain that began approximately 2 days ago, associated with diarrhea. Surgical history includes cholecystectomy, tubal ligation, endometrial ablation and hemorrhoidectomy. EXAM: CT ABDOMEN AND PELVIS WITH CONTRAST TECHNIQUE: Multidetector CT imaging of the abdomen and pelvis was performed using the standard protocol following bolus administration of intravenous contrast. CONTRAST:  172mL OMNIPAQUE IOHEXOL 300 MG/ML IV. COMPARISON:  02/03/2018 and earlier. FINDINGS: Lower chest: Geographic areas of hyperlucency involving the visualized lung bases which are clear. Normal heart size. Hepatobiliary: Liver normal in size and appearance. Anatomic variant in that the LEFT lobe extends well across the midline into the LEFT UPPER QUADRANT. Surgically absent gallbladder. No unexpected biliary ductal dilation. Pancreas: Normal in appearance without evidence of mass, ductal dilation, or inflammation. Spleen: Normal in size and appearance. Adrenals/Urinary Tract: Normal appearing adrenal glands. Benign subcentimeter cyst involving the mid RIGHT kidney, unchanged. No significant parenchymal abnormality involving either kidney. No hydronephrosis. No urinary tract calculi. Normal appearing decompressed urinary bladder. Stomach/Bowel: Stomach normal in appearance for the degree of distention. Normal-appearing small bowel. Descending colon, sigmoid colon and rectum decompressed. Moderate stool burden throughout the remainder of the  colon. Diffuse colonic diverticulosis without evidence of acute diverticulitis. Cecum extends low in the pelvis. Normal appendix identified in the mid and low pelvis. Vascular/Lymphatic: Moderate aortoiliac atherosclerosis without evidence of aneurysm. Normal-appearing portal venous and systemic venous  systems. No pathologic lymphadenopathy. Reproductive: Normal-appearing uterus and ovaries without evidence of adnexal mass. Other: Small BILATERAL inguinal hernias containing fat. Musculoskeletal: Transitional L5 segment with assimilation joints between its transverse processes and the first sacral segment, with degenerative changes in the RIGHT assimilation joint. 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 This numbering is confirmed by counting ribs on prior chest x-rays. No acute findings. IMPRESSION: 1. No acute abnormalities involving the abdomen or pelvis. 2. Diffuse colonic diverticulosis without evidence of acute diverticulitis. 3. Moderate stool burden. 4. Small BILATERAL inguinal hernias containing fat. 5. Geographic areas of localized air trapping in the lung bases likely indicating asthma and/or bronchitis. Aortic Atherosclerosis (ICD10-170.0) Electronically Signed   By: Evangeline Dakin M.D.   On: 10/20/2018 14:18    Procedures Procedures (including critical care time)  Medications Ordered in ED Medications  sodium chloride flush (NS) 0.9 % injection 3 mL (has no administration in time range)  sodium chloride 0.9 % bolus 500 mL (500 mLs Intravenous New Bag/Given 10/20/18 1344)  iohexol (OMNIPAQUE) 300 MG/ML solution 100 mL (100 mLs Intravenous Contrast Given 10/20/18 1355)     Initial Impression / Assessment and Plan / ED Course  I have reviewed the triage vital signs and the nursing notes.  Pertinent labs & imaging results that were available during my care of the patient were reviewed by me and considered in my medical decision making (see chart for details).        Patient with abdominal  pain.  Right lower quadrant.  Lab work reassuring.  Has some mild diarrhea.  CT scan showed only some prominent stool.  Lab work reassuring.  Will discharge home with MiraLAX and outpatient follow-up.  Final Clinical Impressions(s) / ED Diagnoses   Final diagnoses:  Right lower quadrant abdominal pain    ED Discharge Orders         Ordered    polyethylene glycol (MIRALAX) 17 g packet  Daily PRN     10/20/18 1432           Davonna Belling, MD 10/20/18 1432

## 2018-10-20 NOTE — ED Triage Notes (Signed)
Pt presents to ED left sided abdominal pain across lower abdomen which started couple days ago. Pt also has diarrhea.

## 2018-10-23 ENCOUNTER — Other Ambulatory Visit: Payer: Self-pay

## 2018-10-23 ENCOUNTER — Encounter: Payer: Self-pay | Admitting: Gastroenterology

## 2018-10-23 ENCOUNTER — Ambulatory Visit (INDEPENDENT_AMBULATORY_CARE_PROVIDER_SITE_OTHER): Payer: Medicare HMO | Admitting: Gastroenterology

## 2018-10-23 VITALS — BP 126/79 | HR 92 | Temp 97.0°F | Ht 65.0 in | Wt 183.0 lb

## 2018-10-23 DIAGNOSIS — K5900 Constipation, unspecified: Secondary | ICD-10-CM

## 2018-10-23 NOTE — Progress Notes (Signed)
Primary Care Physician: [name], MD  Primary Gastroenterologist:  Barney Drain, MD   Chief Complaint  Patient presents with   Abdominal Pain    ? constipation   Emesis    HPI: [name] is a 53 y.o. female here for follow-up.  She had a telemedicine visit back in April for GERD, constipation, hemorrhoids.  She has a history of H. pylori gastritis status post Pylera.  Eradication confirmed with H. pylori breath testing.  Patient seen in the emergency department 3 days ago for right lower quadrant abdominal pain and little diarrhea.  CT abdomen and pelvis performed.  No evidence of diverticulitis.  Moderate stool burden seen.  Labs unremarkable. Advised to start miralax once daily for constipation.   Patient had been doing well having daily BMs until last week. Stools hard, small balls. Abdominal distention. After in the ED, has increased watermelon consumption. miralax for two days. Still with bloating and fullness. Feels like she needs to go. Last night small stool and vomiting.    Current Outpatient Medications  Medication Sig Dispense Refill   albuterol (VENTOLIN HFA) 108 (90 Base) MCG/ACT inhaler Inhale 1-2 puffs into the lungs every 6 (six) hours as needed for wheezing or shortness of breath.     amLODipine (NORVASC) 5 MG tablet Take 5 mg by mouth daily.     aspirin EC 81 MG tablet Take 81 mg by mouth daily.     atorvastatin (LIPITOR) 40 MG tablet Take 40 mg by mouth at bedtime.     fenofibrate 160 MG tablet Take 160 mg by mouth daily.     gabapentin (NEURONTIN) 300 MG capsule Take 300 mg by mouth daily.     metFORMIN (GLUCOPHAGE) 1000 MG tablet Take 1,000 mg by mouth 2 (two) times daily with a meal.     omeprazole (PRILOSEC) 20 MG capsule TAKE ONE CAPSULE BY MOUTH TWICE DAILY FOR 3 MONTHS, THEN TAKE ONE ONCE DAILY 60 capsule 5   polyethylene glycol (MIRALAX) 17 g packet Take 17 g by mouth daily as needed. 14 each 0   Tiotropium Bromide  Monohydrate (SPIRIVA RESPIMAT) 2.5 MCG/ACT AERS Inhale 2 puffs into the lungs daily.     No current facility-administered medications for this visit.     Allergies as of 10/23/2018 - Review Complete 10/23/2018  Allergen Reaction Noted   Shellfish allergy Anaphylaxis 12/15/2011   Other Itching 07/14/2015    ROS:  General: Negative for anorexia, weight loss, fever, chills, fatigue, weakness. ENT: Negative for hoarseness, difficulty swallowing , nasal congestion. CV: Negative for chest pain, angina, palpitations, dyspnea on exertion, peripheral edema.  Respiratory: Negative for dyspnea at rest, dyspnea on exertion, cough, sputum, wheezing.  GI: See history of present illness. GU:  Negative for dysuria, hematuria, urinary incontinence, urinary frequency, nocturnal urination.  Endo: Negative for unusual weight change.    Physical Examination:   BP 126/79    Pulse 92    Temp (!) 97 F (36.1 C) (Oral)    Ht 5\' 5"  (1.651 m)    Wt 183 lb (83 kg)    BMI 30.45 kg/m   General: Well-nourished, well-developed in no acute distress.  Eyes: No icterus. Abdomen: Bowel sounds are normal, nondistended, mild lower abdominal tenderness.   Extremities: No lower extremity edema. No clubbing or deformities. Neuro: Alert and oriented x 4   Skin: Warm and dry, no jaundice.   Psych: Alert and cooperative, normal mood and affect.  Labs:  Lab Results  Component Value Date   CREATININE 0.98 10/20/2018   BUN 19 10/20/2018   NA 141 10/20/2018   K 3.8 10/20/2018   CL 106 10/20/2018   CO2 25 10/20/2018   Lab Results  Component Value Date   WBC 8.0 10/20/2018   HGB 11.5 (L) 10/20/2018   HCT 36.3 10/20/2018   MCV 89.4 10/20/2018   PLT 287 10/20/2018   Lab Results  Component Value Date   LIPASE 32 10/20/2018   Lab Results  Component Value Date   ALT 24 10/20/2018   AST 23 10/20/2018   ALKPHOS 61 10/20/2018   BILITOT 0.4 10/20/2018    Imaging Studies: Ct Abdomen Pelvis W  Contrast  Result Date: 10/20/2018 CLINICAL DATA:  LEFT LOWER QUADRANT abdominal pain that began approximately 2 days ago, associated with diarrhea. Surgical history includes cholecystectomy, tubal ligation, endometrial ablation and hemorrhoidectomy. EXAM: CT ABDOMEN AND PELVIS WITH CONTRAST TECHNIQUE: Multidetector CT imaging of the abdomen and pelvis was performed using the standard protocol following bolus administration of intravenous contrast. CONTRAST:  19mL OMNIPAQUE IOHEXOL 300 MG/ML IV. COMPARISON:  02/03/2018 and earlier. FINDINGS: Lower chest: Geographic areas of hyperlucency involving the visualized lung bases which are clear. Normal heart size. Hepatobiliary: Liver normal in size and appearance. Anatomic variant in that the LEFT lobe extends well across the midline into the LEFT UPPER QUADRANT. Surgically absent gallbladder. No unexpected biliary ductal dilation. Pancreas: Normal in appearance without evidence of mass, ductal dilation, or inflammation. Spleen: Normal in size and appearance. Adrenals/Urinary Tract: Normal appearing adrenal glands. Benign subcentimeter cyst involving the mid RIGHT kidney, unchanged. No significant parenchymal abnormality involving either kidney. No hydronephrosis. No urinary tract calculi. Normal appearing decompressed urinary bladder. Stomach/Bowel: Stomach normal in appearance for the degree of distention. Normal-appearing small bowel. Descending colon, sigmoid colon and rectum decompressed. Moderate stool burden throughout the remainder of the colon. Diffuse colonic diverticulosis without evidence of acute diverticulitis. Cecum extends low in the pelvis. Normal appendix identified in the mid and low pelvis. Vascular/Lymphatic: Moderate aortoiliac atherosclerosis without evidence of aneurysm. Normal-appearing portal venous and systemic venous systems. No pathologic lymphadenopathy. Reproductive: Normal-appearing uterus and ovaries without evidence of adnexal mass.  Other: Small BILATERAL inguinal hernias containing fat. Musculoskeletal: Transitional L5 segment with assimilation joints between its transverse processes and the first sacral segment, with degenerative changes in the RIGHT assimilation joint. 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 This numbering is confirmed by counting ribs on prior chest x-rays. No acute findings. IMPRESSION: 1. No acute abnormalities involving the abdomen or pelvis. 2. Diffuse colonic diverticulosis without evidence of acute diverticulitis. 3. Moderate stool burden. 4. Small BILATERAL inguinal hernias containing fat. 5. Geographic areas of localized air trapping in the lung bases likely indicating asthma and/or bronchitis. Aortic Atherosclerosis (ICD10-170.0) Electronically Signed   By: Evangeline Dakin M.D.   On: 10/20/2018 14:18

## 2018-10-23 NOTE — Assessment & Plan Note (Signed)
amitiza 53mcg twice daily for couple of days. Samples provided. Continue miralax 1 packet daily as needed. Call next week with progress report. Ov in six months.

## 2018-10-23 NOTE — Patient Instructions (Addendum)
1. Take Amitiza twice daily with food for constipation. I want you to take over the next few days until you feel like you have had significant results.  2. Then you can continue Miralax one packet daily for constipation as needed. When you run out, you can buy over the counter or call for RX.  3. Call next week and let me know how you are doing.

## 2018-10-27 NOTE — Progress Notes (Signed)
CC'D TO PCP °

## 2018-10-29 DIAGNOSIS — Z6828 Body mass index (BMI) 28.0-28.9, adult: Secondary | ICD-10-CM | POA: Diagnosis not present

## 2018-10-29 DIAGNOSIS — I10 Essential (primary) hypertension: Secondary | ICD-10-CM | POA: Diagnosis not present

## 2018-10-29 DIAGNOSIS — I69898 Other sequelae of other cerebrovascular disease: Secondary | ICD-10-CM | POA: Diagnosis not present

## 2018-10-29 DIAGNOSIS — I679 Cerebrovascular disease, unspecified: Secondary | ICD-10-CM | POA: Diagnosis not present

## 2018-10-29 DIAGNOSIS — R69 Illness, unspecified: Secondary | ICD-10-CM | POA: Diagnosis not present

## 2018-10-29 DIAGNOSIS — J4530 Mild persistent asthma, uncomplicated: Secondary | ICD-10-CM | POA: Diagnosis not present

## 2018-10-29 DIAGNOSIS — E119 Type 2 diabetes mellitus without complications: Secondary | ICD-10-CM | POA: Diagnosis not present

## 2018-10-29 DIAGNOSIS — E7849 Other hyperlipidemia: Secondary | ICD-10-CM | POA: Diagnosis not present

## 2018-11-05 DIAGNOSIS — E7849 Other hyperlipidemia: Secondary | ICD-10-CM | POA: Diagnosis not present

## 2018-11-05 DIAGNOSIS — R69 Illness, unspecified: Secondary | ICD-10-CM | POA: Diagnosis not present

## 2018-11-05 DIAGNOSIS — J4530 Mild persistent asthma, uncomplicated: Secondary | ICD-10-CM | POA: Diagnosis not present

## 2018-11-05 DIAGNOSIS — I10 Essential (primary) hypertension: Secondary | ICD-10-CM | POA: Diagnosis not present

## 2018-11-05 DIAGNOSIS — E119 Type 2 diabetes mellitus without complications: Secondary | ICD-10-CM | POA: Diagnosis not present

## 2018-11-11 DIAGNOSIS — E119 Type 2 diabetes mellitus without complications: Secondary | ICD-10-CM | POA: Diagnosis not present

## 2018-11-13 DIAGNOSIS — R69 Illness, unspecified: Secondary | ICD-10-CM | POA: Diagnosis not present

## 2018-11-26 DIAGNOSIS — Z91018 Allergy to other foods: Secondary | ICD-10-CM | POA: Diagnosis not present

## 2018-11-26 DIAGNOSIS — Z87891 Personal history of nicotine dependence: Secondary | ICD-10-CM | POA: Diagnosis not present

## 2018-11-26 DIAGNOSIS — E119 Type 2 diabetes mellitus without complications: Secondary | ICD-10-CM | POA: Diagnosis not present

## 2018-11-26 DIAGNOSIS — Z79899 Other long term (current) drug therapy: Secondary | ICD-10-CM | POA: Diagnosis not present

## 2018-11-26 DIAGNOSIS — E7800 Pure hypercholesterolemia, unspecified: Secondary | ICD-10-CM | POA: Diagnosis not present

## 2018-11-26 DIAGNOSIS — Z8249 Family history of ischemic heart disease and other diseases of the circulatory system: Secondary | ICD-10-CM | POA: Diagnosis not present

## 2018-11-26 DIAGNOSIS — Z91013 Allergy to seafood: Secondary | ICD-10-CM | POA: Diagnosis not present

## 2018-11-26 DIAGNOSIS — I10 Essential (primary) hypertension: Secondary | ICD-10-CM | POA: Diagnosis not present

## 2018-12-10 DIAGNOSIS — E7849 Other hyperlipidemia: Secondary | ICD-10-CM | POA: Diagnosis not present

## 2018-12-10 DIAGNOSIS — J4530 Mild persistent asthma, uncomplicated: Secondary | ICD-10-CM | POA: Diagnosis not present

## 2018-12-10 DIAGNOSIS — I10 Essential (primary) hypertension: Secondary | ICD-10-CM | POA: Diagnosis not present

## 2018-12-10 DIAGNOSIS — E119 Type 2 diabetes mellitus without complications: Secondary | ICD-10-CM | POA: Diagnosis not present

## 2018-12-16 DIAGNOSIS — L720 Epidermal cyst: Secondary | ICD-10-CM | POA: Diagnosis not present

## 2018-12-24 DIAGNOSIS — G5622 Lesion of ulnar nerve, left upper limb: Secondary | ICD-10-CM | POA: Diagnosis not present

## 2018-12-24 DIAGNOSIS — I69352 Hemiplegia and hemiparesis following cerebral infarction affecting left dominant side: Secondary | ICD-10-CM | POA: Diagnosis not present

## 2018-12-24 DIAGNOSIS — E1140 Type 2 diabetes mellitus with diabetic neuropathy, unspecified: Secondary | ICD-10-CM | POA: Diagnosis not present

## 2018-12-24 DIAGNOSIS — I10 Essential (primary) hypertension: Secondary | ICD-10-CM | POA: Diagnosis not present

## 2018-12-25 DIAGNOSIS — J411 Mucopurulent chronic bronchitis: Secondary | ICD-10-CM | POA: Diagnosis not present

## 2018-12-25 DIAGNOSIS — D869 Sarcoidosis, unspecified: Secondary | ICD-10-CM | POA: Diagnosis not present

## 2019-01-06 ENCOUNTER — Encounter: Payer: Self-pay | Admitting: Gastroenterology

## 2019-01-06 ENCOUNTER — Ambulatory Visit (INDEPENDENT_AMBULATORY_CARE_PROVIDER_SITE_OTHER): Payer: Medicare Other | Admitting: Nurse Practitioner

## 2019-01-06 ENCOUNTER — Encounter: Payer: Self-pay | Admitting: Nurse Practitioner

## 2019-01-06 ENCOUNTER — Other Ambulatory Visit: Payer: Self-pay

## 2019-01-06 DIAGNOSIS — K5900 Constipation, unspecified: Secondary | ICD-10-CM | POA: Diagnosis not present

## 2019-01-06 DIAGNOSIS — K219 Gastro-esophageal reflux disease without esophagitis: Secondary | ICD-10-CM

## 2019-01-06 NOTE — Assessment & Plan Note (Signed)
Worsening GERD symptoms, omeprazole twice daily is not helping.  At this point we will switch her to Protonix twice daily to see if this has any better effect.  She can use Pepcid over-the-counter as needed for breakthrough.  Recommend she call us with a progress report in 3 weeks so that we can change her medications as needed.  Follow-up in 3 months.

## 2019-01-06 NOTE — Patient Instructions (Signed)
Your health issues we discussed today were:   GERD (acid reflux or heartburn): 1. Stop taking omeprazole (Prilosec). 2. I have sent a prescription for Protonix (pantoprazole) 40 mg.  Take this twice a day, 30 minutes before your first meal the day and 30 minutes before your last meal the day 3. You can use Pepcid over-the-counter as needed for "breakthrough" symptoms. 4. Let us know if you are requiring Pepcid regularly or if the Protonix is not helping 5. Regardless, call us in 3 weeks and let us know how the medicine is doing 6. Call us if you have any worsening or severe symptoms  Constipation: 1. Continue using MiraLAX as needed to help constipation 2. Let us know if you have any worsening or severe symptoms  Overall I recommend:  1. Continue your other current medications 2. Return for follow-up in 3 months 3. Call us if you have any questions or concerns.   Because of recent events of COVID-19 ("Coronavirus"), follow CDC recommendations:  1. Wash your hand frequently 2. Avoid touching your face 3. Stay away from people who are sick 4. If you have symptoms such as fever, cough, shortness of breath then call your healthcare provider for further guidance 5. If you are sick, STAY AT HOME unless otherwise directed by your healthcare provider. 6. Follow directions from state and national officials regarding staying safe   At [facility] we value your feedback. You may receive a survey about your visit today. Please share your experience as we strive to create trusting relationships with our patients to provide genuine, compassionate, quality care.  We appreciate your understanding and patience as we review any laboratory studies, imaging, and other diagnostic tests that are ordered as we care for you. Our office policy is 5 business days for review of these results, and any emergent or urgent results are addressed in a timely manner for your best interest. If you do  not hear from our office in 1 week, please contact us.   We also encourage the use of MyChart, which contains your medical information for your review as well. If you are not enrolled in this feature, an access code is on this after visit summary for your convenience. Thank you for allowing [place] to be involved in your care.  It was great to see you today!  I hope you have a great Fall!!

## 2019-01-06 NOTE — Progress Notes (Signed)
Referring Provider: [name], MD Primary Care Physician:  [name], MD Primary GI:  Dr. Oneida Alar  NOTE: Service was provided via telemedicine and was requested by the patient due to COVID-19 pandemic.  Method of visit: Telephone  Patient Location: Home  Provider Location: Office  Reason for Phone Visit: Follow-up  The patient was consented to phone follow-up via telephone encounter including billing of the encounter (yes/no): Yes  Persons present on the phone encounter, with roles: Son  Total time (minutes) spent on medical discussion: 22 minutes  Chief Complaint  Patient presents with  . Gastroesophageal Reflux    HPI:   [name] is a 53 y.o. female who presents for virtual visit regarding: Constipation.  The patient was last seen in our office 10/23/2018 for the same.  Chronic history of GERD, constipation, hemorrhoids.  Previous H. pylori gastritis status post Pylera and eradication confirmed with breath testing.  At her last visit she was doing well and having daily bowel movements until the week prior and started noting hard, small balls of stool with abdominal distention.  After emergency room visit with CT finding no acute abnormality, moderate stool burden; she increased fiber through watermelon consumption and added MiraLAX for 2 days.  Last night she had a small stool and vomiting.  Recommended Amitiza twice a day for the next several days until significant results then continue MiraLAX daily as needed.  Today she states she's doing ok overall. Constipation doing well on MiraLAX as need. Has regular bowel movements, rare hard stool but mostly without symptoms. Having GERD symptoms, was on Zantac but this was recalled. Was placed on something else, but can't remember. Med list indicated Prilosec. This is not working well for her, still has daily symptoms. She is on blood thinner as well. Has associated abdominal pain epigastric area. Denies N/V,  hematochezia, melena, fever, chills, unintentional weight loss. Denies URI or flu-like symptoms. Denies loss of sense of taste or smell. Denies chest pain, dyspnea, dizziness, lightheadedness, syncope, near syncope. Denies any other upper or lower GI symptoms.  Past Medical History:  Diagnosis Date  . Abnormal uterine bleeding (AUB) 11/26/2012  . 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 Asthma   . BV (bacterial vaginosis) 11/26/2012  . Constipation   . CVA (cerebral vascular accident) ([place]) 02/09/2018  . Diabetes mellitus   . Fatigue 12/24/2012  . Fibroids 01/01/2013  . GERD (gastroesophageal reflux disease)   . Helicobacter pylori gastritis JAN 2017   EGD Bx  . Hematuria 04/05/2015  . Hemorrhoid 01/01/2013  . Hemorrhoids 04/05/2015  . Hypertension   . IBS (irritable bowel syndrome)   . Neuropathy due to secondary diabetes ([place])   . Other and unspecified ovarian cyst 01/01/2013  . Papanicolaou smear of cervix with positive high risk human papilloma virus (HPV) test 04/22/2018   Repeat in 1 year  . Sarcoidosis   . Stroke ([place]) 01/2018  . Vaginal discharge 02/12/2013   +yeast  . Vaginal itching 02/14/2015  . Vaginal odor 04/05/2015    Past Surgical History:  Procedure Laterality Date  . BACTERIAL OVERGROWTH TEST N/A 10/21/2015   Procedure: BACTERIAL OVERGROWTH TEST;  Surgeon: Danie Binder, MD;  Location: [facility];  Service: Endoscopy;  Laterality: N/A;  0800  . CHOLECYSTECTOMY    . COLONOSCOPY N/A 08/20/2013   Dr. Oneida Alar: normal mucosa in terminal ileum, mild diverticulosis in ascending colon, moderate sized hemorrhoids s/p banding  . ENDOMETRIAL ABLATION    . ESOPHAGOGASTRODUODENOSCOPY  N/A 04/13/2015   SLF: 1. FEDA due ot erosive gastriitis/ rectal bleeding   . FLEXIBLE SIGMOIDOSCOPY N/A 04/13/2015   SLF: 1. Rectal bleeding due to moderate sized internal hemorrhoids  . HEMORRHOID BANDING  08/20/2013   Procedure: HEMORRHOID BANDING;  Surgeon: Danie Binder, MD;  Location: [facility];  Service: Endoscopy;;  . HEMORRHOID BANDING  04/13/2015   Procedure: Thayer Jew;  Surgeon: Danie Binder, MD;  Location: [facility];  Service: Endoscopy;;  . HEMORRHOID SURGERY     banding  . HEMORRHOID SURGERY N/A 12/25/2013   Procedure: EXTENSIVE HEMORRHOIDECTOMY;  Surgeon: Jamesetta So, MD;  Location: [facility];  Service: General;  Laterality: N/A;  . SMART PILL PROCEDURE N/A 09/19/2015   Procedure: SMART PILL PROCEDURE;  Surgeon: Danie Binder, MD;  Location: [facility];  Service: Endoscopy;  Laterality: N/A;  0800  . TUBAL LIGATION      Current Outpatient Medications  Medication Sig Dispense Refill  . albuterol (VENTOLIN HFA) 108 (90 Base) MCG/ACT inhaler Inhale 1-2 puffs into the lungs every 6 (six) hours as needed for wheezing or shortness of breath.    Marland Kitchen amLODipine (NORVASC) 5 MG tablet Take 5 mg by mouth daily.    Marland Kitchen aspirin EC 81 MG tablet Take 81 mg by mouth daily.    Marland Kitchen atorvastatin (LIPITOR) 40 MG tablet Take 40 mg by mouth at bedtime.    . fenofibrate 160 MG tablet Take 160 mg by mouth daily.    Marland Kitchen gabapentin (NEURONTIN) 300 MG capsule Take 300 mg by mouth daily.    . metFORMIN (GLUCOPHAGE) 1000 MG tablet Take 1,000 mg by mouth 2 (two) times daily with a meal.    . omeprazole (PRILOSEC) 20 MG capsule TAKE ONE CAPSULE BY MOUTH TWICE DAILY FOR 3 MONTHS, THEN TAKE ONE ONCE DAILY 60 capsule 5  . polyethylene glycol (MIRALAX) 17 g packet Take 17 g by mouth daily as needed. 14 each 0  . Tiotropium Bromide Monohydrate (SPIRIVA RESPIMAT) 2.5 MCG/ACT AERS Inhale 2 puffs into the lungs daily.     No current facility-administered medications for this visit.     Allergies as of 01/06/2019 - Review Complete 01/06/2019  Allergen Reaction Noted  . Shellfish allergy Anaphylaxis 12/15/2011  . Other Itching 07/14/2015    Family History  Problem Relation Age of Onset  . Diabetes Father   . Cancer Mother        throat  . Cancer Brother        lung  . Breast cancer  Cousin   . Cancer Maternal Aunt   . Diabetes Maternal Aunt   . Colon cancer Neg Hx     Social History   Socioeconomic History  . Marital status: Single    Spouse name: Not on file  . Number of children: 2  . Years of education: Not on file  . Highest education level: Not on file  Occupational History    Employer: UNEMPLOYED  Social Needs  . Financial resource strain: Not on file  . Food insecurity    Worry: Not on file    Inability: Not on file  . Transportation needs    Medical: Not on file    Non-medical: Not on file  Tobacco Use  . Smoking status: Former Smoker    Packs/day: 1.00    Years: 6.00    Pack years: 6.00    Types: Cigarettes    Quit date: 12/22/2003    Years since quitting:  15.0  . Smokeless tobacco: Never Used  . Tobacco comment: Quit  5 years  Substance and Sexual Activity  . Alcohol use: Not Currently    Comment: "once in a while"  . Drug use: No  . Sexual activity: Not Currently    Birth control/protection: Surgical    Comment: tubal and ablation  Lifestyle  . Physical activity    Days per week: Not on file    Minutes per session: Not on file  . Stress: Not on file  Relationships  . Social Herbalist on phone: Not on file    Gets together: Not on file    Attends religious service: Not on file    Active member of club or organization: Not on file    Attends meetings of clubs or organizations: Not on file    Relationship status: Not on file  Other Topics Concern  . Not on file  Social History Narrative  . Not on file    Review of Systems: General: Negative for anorexia, weight loss, fever, chills, fatigue, weakness. ENT: Negative for hoarseness, difficulty swallowing. CV: Negative for chest pain, angina, palpitations, peripheral edema.  Respiratory: Negative for dyspnea at rest, cough, sputum, wheezing.  GI: See history of present illness. Endo: Negative for unusual weight change.  Heme: Negative for bruising or bleeding.  Allergy: Negative for rash or hives.  Physical Exam: Note: limited exam due to virtual visit General:   Alert and oriented. Pleasant and cooperative. Ears:  Normal auditory acuity. Neurologic:  Alert and oriented x4 Psych:  Alert and cooperative. Normal mood and affect.

## 2019-01-06 NOTE — Assessment & Plan Note (Signed)
Constipation currently doing well on MiraLAX as needed.  Recommend she continue this regimen and call us for any worsening symptoms.  Follow-up in 3 months.

## 2019-01-08 DIAGNOSIS — I10 Essential (primary) hypertension: Secondary | ICD-10-CM | POA: Diagnosis not present

## 2019-01-08 DIAGNOSIS — E1165 Type 2 diabetes mellitus with hyperglycemia: Secondary | ICD-10-CM | POA: Diagnosis not present

## 2019-01-08 DIAGNOSIS — E7849 Other hyperlipidemia: Secondary | ICD-10-CM | POA: Diagnosis not present

## 2019-01-09 ENCOUNTER — Telehealth: Payer: Self-pay

## 2019-01-09 DIAGNOSIS — K219 Gastro-esophageal reflux disease without esophagitis: Secondary | ICD-10-CM

## 2019-01-09 MED ORDER — PANTOPRAZOLE SODIUM 40 MG PO TBEC: 40.0000 mg | DELAYED_RELEASE_TABLET | Freq: Two times a day (BID) | ORAL | 3 refills | Status: DC

## 2019-01-09 NOTE — Addendum Note (Signed)
Addended by: Gordy Levan, Marcellous Snarski A on: 01/09/2019 01:35 PM   Modules accepted: Orders

## 2019-01-09 NOTE — Telephone Encounter (Signed)
VM received, pt had a telephone visit on Tuesday 01/06/2019. Please send Pantoprazole to pt's pharmacy.

## 2019-01-09 NOTE — Telephone Encounter (Signed)
Rx sent 

## 2019-01-10 DIAGNOSIS — J439 Emphysema, unspecified: Secondary | ICD-10-CM | POA: Diagnosis not present

## 2019-01-10 DIAGNOSIS — R319 Hematuria, unspecified: Secondary | ICD-10-CM | POA: Diagnosis not present

## 2019-01-10 DIAGNOSIS — I700 Atherosclerosis of aorta: Secondary | ICD-10-CM | POA: Diagnosis not present

## 2019-01-10 DIAGNOSIS — I10 Essential (primary) hypertension: Secondary | ICD-10-CM | POA: Diagnosis not present

## 2019-01-10 DIAGNOSIS — Z87891 Personal history of nicotine dependence: Secondary | ICD-10-CM | POA: Diagnosis not present

## 2019-01-10 DIAGNOSIS — E119 Type 2 diabetes mellitus without complications: Secondary | ICD-10-CM | POA: Diagnosis not present

## 2019-01-10 DIAGNOSIS — Z7902 Long term (current) use of antithrombotics/antiplatelets: Secondary | ICD-10-CM | POA: Diagnosis not present

## 2019-01-10 DIAGNOSIS — E7800 Pure hypercholesterolemia, unspecified: Secondary | ICD-10-CM | POA: Diagnosis not present

## 2019-01-10 DIAGNOSIS — Z79899 Other long term (current) drug therapy: Secondary | ICD-10-CM | POA: Diagnosis not present

## 2019-01-10 DIAGNOSIS — R109 Unspecified abdominal pain: Secondary | ICD-10-CM | POA: Diagnosis not present

## 2019-01-10 DIAGNOSIS — M549 Dorsalgia, unspecified: Secondary | ICD-10-CM | POA: Diagnosis not present

## 2019-01-10 DIAGNOSIS — Z7984 Long term (current) use of oral hypoglycemic drugs: Secondary | ICD-10-CM | POA: Diagnosis not present

## 2019-01-10 DIAGNOSIS — R079 Chest pain, unspecified: Secondary | ICD-10-CM | POA: Diagnosis not present

## 2019-01-12 DIAGNOSIS — M7918 Myalgia, other site: Secondary | ICD-10-CM | POA: Diagnosis not present

## 2019-01-12 NOTE — Telephone Encounter (Signed)
Noted  

## 2019-02-05 DIAGNOSIS — Z0000 Encounter for general adult medical examination without abnormal findings: Secondary | ICD-10-CM | POA: Diagnosis not present

## 2019-02-05 DIAGNOSIS — J40 Bronchitis, not specified as acute or chronic: Secondary | ICD-10-CM | POA: Diagnosis not present

## 2019-03-06 DIAGNOSIS — E1165 Type 2 diabetes mellitus with hyperglycemia: Secondary | ICD-10-CM | POA: Diagnosis not present

## 2019-03-06 DIAGNOSIS — I10 Essential (primary) hypertension: Secondary | ICD-10-CM | POA: Diagnosis not present

## 2019-03-06 DIAGNOSIS — E7849 Other hyperlipidemia: Secondary | ICD-10-CM | POA: Diagnosis not present

## 2019-03-16 DIAGNOSIS — H02409 Unspecified ptosis of unspecified eyelid: Secondary | ICD-10-CM | POA: Diagnosis not present

## 2019-03-16 DIAGNOSIS — E119 Type 2 diabetes mellitus without complications: Secondary | ICD-10-CM | POA: Diagnosis not present

## 2019-03-16 DIAGNOSIS — H04123 Dry eye syndrome of bilateral lacrimal glands: Secondary | ICD-10-CM | POA: Diagnosis not present

## 2019-03-16 DIAGNOSIS — Z7984 Long term (current) use of oral hypoglycemic drugs: Secondary | ICD-10-CM | POA: Diagnosis not present

## 2019-03-23 ENCOUNTER — Other Ambulatory Visit (HOSPITAL_COMMUNITY): Payer: Self-pay | Admitting: Internal Medicine

## 2019-03-23 DIAGNOSIS — Z1231 Encounter for screening mammogram for malignant neoplasm of breast: Secondary | ICD-10-CM

## 2019-04-08 ENCOUNTER — Ambulatory Visit (INDEPENDENT_AMBULATORY_CARE_PROVIDER_SITE_OTHER): Payer: Medicare Other | Admitting: Nurse Practitioner

## 2019-04-08 ENCOUNTER — Encounter: Payer: Self-pay | Admitting: Nurse Practitioner

## 2019-04-08 ENCOUNTER — Other Ambulatory Visit: Payer: Self-pay

## 2019-04-08 VITALS — BP 136/82 | HR 97 | Temp 97.0°F | Ht 66.0 in | Wt 187.0 lb

## 2019-04-08 DIAGNOSIS — K5901 Slow transit constipation: Secondary | ICD-10-CM

## 2019-04-08 DIAGNOSIS — K219 Gastro-esophageal reflux disease without esophagitis: Secondary | ICD-10-CM | POA: Diagnosis not present

## 2019-04-08 DIAGNOSIS — R1030 Lower abdominal pain, unspecified: Secondary | ICD-10-CM

## 2019-04-08 DIAGNOSIS — R109 Unspecified abdominal pain: Secondary | ICD-10-CM | POA: Insufficient documentation

## 2019-04-08 NOTE — Progress Notes (Signed)
Cc'ed to pcp °

## 2019-04-08 NOTE — Assessment & Plan Note (Signed)
Doing well on PPI.  Recommend she continue her current medications and follow-up in 6 months.

## 2019-04-08 NOTE — Assessment & Plan Note (Signed)
Constipation somewhat managed on MiraLAX.  She does have daily bowel movements without hard stools.  She is avoiding straining.  However, she has persistent incomplete emptying as well as abdominal pain as noted below.  I feel given Sharpy benefit from [place].  She was previously trialed on Linzess 290 mcg which was too much for her.  She has not tried lower dose Linzess.  I will start her on Linzess 72 mcg daily on empty stomach, hold MiraLAX when she starts Linzess.  Will provide samples for 1 to 2 weeks when we receive them in the next day or 2.  Request progress report in 1 to 2 weeks.  Follow-up in 6 months.

## 2019-04-08 NOTE — Assessment & Plan Note (Signed)
With worsening control of her constipation she notes lower abdominal cramping that is typically relieved after a bowel movement.  However, she does not empty completely she will have some residual discomfort.  No worsening pain on exam.  I feel is likely related to her constipation and we will try to manage this better as per above.  Follow-up in 6 months.  Call for any worsening or severe symptoms.

## 2019-04-08 NOTE — Patient Instructions (Signed)
Your health issues we discussed today were:   GERD (reflux/heartburn): 1. I am glad your GERD symptoms are doing well 2. Continue taking your acid blocker 3. Call us for any worsening or severe symptoms  Constipation with abdominal pain: 1. I think we have reached our peak benefit with MiraLAX 2. We do not have any samples of Linzess 72 mcg currently.   3. Once we receive them in the next day or two we will have you pick up Linzess 72 mcg.  Take this once a day on an empty stomach 4. There is a small chance he will develop diarrhea, which typically resolves after a few days.  If it is longer than a week or intolerable, stop taking Linzess and let us now 5. Stop taking MiraLAX when you start taking Linzess 6. Call us in 1 to 2 weeks and let us know if Linzess is helping your bowel movements 7. Call for any worsening or severe symptoms  Overall I recommend:  1. Continue your other current medications 2. Return for follow-up in 6 months 3. Call us if you have any questions or concerns   Because of recent events of COVID-19 ("Coronavirus"), follow CDC recommendations:  Wash your hand frequently Avoid touching your face Stay away from people who are sick If you have symptoms such as fever, cough, shortness of breath then call your healthcare provider for further guidance If you are sick, STAY AT HOME unless otherwise directed by your healthcare provider. Follow directions from state and national officials regarding staying safe   At [facility] we value your feedback. You may receive a survey about your visit today. Please share your experience as we strive to create trusting relationships with our patients to provide genuine, compassionate, quality care.  We appreciate your understanding and patience as we review any laboratory studies, imaging, and other diagnostic tests that are ordered as we care for you. Our office policy is 5 business days for review of these  results, and any emergent or urgent results are addressed in a timely manner for your best interest. If you do not hear from our office in 1 week, please contact us.   We also encourage the use of MyChart, which contains your medical information for your review as well. If you are not enrolled in this feature, an access code is on this after visit summary for your convenience. Thank you for allowing [place] to be involved in your care.  It was great to see you today!  I hope you have a Happy New Year!!

## 2019-04-08 NOTE — Progress Notes (Signed)
Referring Provider: [name], MD Primary Care Physician:  [name], MD Primary GI:  Dr. Oneida Alar  Chief Complaint  Patient presents with  . Gastroesophageal Reflux    improved  . Constipation    does nto feel like she is emptying complete    HPI:   [name] is a 54 y.o. female who presents for follow-up on constipation and GERD.  The patient was last seen in our office 01/06/2019 for the same.  Her last visit was at virtual office visit due to COVID-19/coronavirus pandemic.  Chronic history of GERD, constipation, hemorrhoids.  For constipation previously recommended Amitiza twice daily until significant results and continue MiraLAX as needed.  At her last visit constipation doing well on MiraLAX with regular bowel movements.  Rarely breakthrough symptoms.  Was on Zantac for GERD but this was recalled and was placed on another medication, likely Prilosec which is not working well for her with daily symptoms.  No other overt GI complaints.  Recommended change omeprazole to Protonix 40 mg twice daily, Pepcid over-the-counter for breakthrough symptoms, notify us if Protonix not helping or if requiring regular Pepcid.  Regardless requested 3-week progress report.  Continue MiraLAX and follow-up in 3 months.  No progress report was called to our office.  Today she states she's doing ok overall. GERD doing well on Protonix 40 mg bid. Constipation doing pretty good on MiraLAX, although has sensation of incomplete emptying. She does typically have a bowel movement every day. Denies hard stools, has been avoiding straining. She was previously on Linzess 290 mcg which was too much. She has never tried Linzess 72 mcg. She is also having some associated lower abdominal cramping, which improves after a bowel movement. Denies hematochezia, melena, N/V, fever, chills, unintentional weight loss.   Past Medical History:  Diagnosis Date  . Abnormal uterine bleeding (AUB) 11/26/2012  .  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 Asthma   . BV (bacterial vaginosis) 11/26/2012  . Constipation   . CVA (cerebral vascular accident) ([place]) 02/09/2018  . Diabetes mellitus   . Fatigue 12/24/2012  . Fibroids 01/01/2013  . GERD (gastroesophageal reflux disease)   . Helicobacter pylori gastritis JAN 2017   EGD Bx  . Hematuria 04/05/2015  . Hemorrhoid 01/01/2013  . Hemorrhoids 04/05/2015  . Hypertension   . IBS (irritable bowel syndrome)   . Neuropathy due to secondary diabetes ([place])   . Other and unspecified ovarian cyst 01/01/2013  . Papanicolaou smear of cervix with positive high risk human papilloma virus (HPV) test 04/22/2018   Repeat in 1 year  . Sarcoidosis   . Stroke ([place]) 01/2018  . Vaginal discharge 02/12/2013   +yeast  . Vaginal itching 02/14/2015  . Vaginal odor 04/05/2015    Past Surgical History:  Procedure Laterality Date  . BACTERIAL OVERGROWTH TEST N/A 10/21/2015   Procedure: BACTERIAL OVERGROWTH TEST;  Surgeon: Danie Binder, MD;  [facility];  Service: Endoscopy;  Laterality: N/A;  0800  . CHOLECYSTECTOMY    . COLONOSCOPY N/A 08/20/2013   Dr. Oneida Alar: normal mucosa in terminal ileum, mild diverticulosis in ascending colon, moderate sized hemorrhoids s/p banding  . ENDOMETRIAL ABLATION    . ESOPHAGOGASTRODUODENOSCOPY N/A 04/13/2015   SLF: 1. FEDA due ot erosive gastriitis/ rectal bleeding   . FLEXIBLE SIGMOIDOSCOPY N/A 04/13/2015   SLF: 1. Rectal bleeding due to moderate sized internal hemorrhoids  . HEMORRHOID BANDING  08/20/2013   Procedure: HEMORRHOID BANDING;  Surgeon: Danie Binder,  MD;  [facility];  Service: Endoscopy;;  . HEMORRHOID BANDING  04/13/2015   Procedure: HEMORRHOID BANDING;  Surgeon: Danie Binder, MD;  [facility];  Service: Endoscopy;;  . HEMORRHOID SURGERY     banding  . HEMORRHOID SURGERY N/A 12/25/2013   Procedure: EXTENSIVE HEMORRHOIDECTOMY;  Surgeon: Jamesetta So, MD;  Location: [facility];  Service:  General;  Laterality: N/A;  . SMART PILL PROCEDURE N/A 09/19/2015   Procedure: SMART PILL PROCEDURE;  Surgeon: Danie Binder, MD;  [facility];  Service: Endoscopy;  Laterality: N/A;  0800  . TUBAL LIGATION      Current Outpatient Medications  Medication Sig Dispense Refill  . albuterol (VENTOLIN HFA) 108 (90 Base) MCG/ACT inhaler Inhale 1-2 puffs into the lungs every 6 (six) hours as needed for wheezing or shortness of breath.    Marland Kitchen amLODipine (NORVASC) 5 MG tablet Take 5 mg by mouth daily.    Marland Kitchen aspirin EC 81 MG tablet Take 81 mg by mouth daily.    Marland Kitchen atorvastatin (LIPITOR) 40 MG tablet Take 40 mg by mouth at bedtime.    . fenofibrate 160 MG tablet Take 160 mg by mouth daily.    Marland Kitchen gabapentin (NEURONTIN) 300 MG capsule Take 300 mg by mouth daily.    . metFORMIN (GLUCOPHAGE) 1000 MG tablet Take 1,000 mg by mouth 2 (two) times daily with a meal.    . pantoprazole (PROTONIX) 40 MG tablet Take 1 tablet (40 mg total) by mouth 2 (two) times daily before a meal. 60 tablet 3  . polyethylene glycol (MIRALAX) 17 g packet Take 17 g by mouth daily as needed. 14 each 0  . Tiotropium Bromide Monohydrate (SPIRIVA RESPIMAT) 2.5 MCG/ACT AERS Inhale 2 puffs into the lungs daily.     No current facility-administered medications for this visit.    Allergies as of 04/08/2019 - Review Complete 04/08/2019  Allergen Reaction Noted  . Shellfish allergy Anaphylaxis 12/15/2011  . Other Itching 07/14/2015    Family History  Problem Relation Age of Onset  . Diabetes Father   . Cancer Mother        throat  . Cancer Brother        lung  . Breast cancer Cousin   . Cancer Maternal Aunt   . Diabetes Maternal Aunt   . Colon cancer Neg Hx     Social History   Socioeconomic History  . Marital status: Single    Spouse name: Not on file  . Number of children: 2  . Years of education: Not on file  . Highest education level: Not on file  Occupational History    Employer: UNEMPLOYED  Tobacco Use    . Smoking status: Former Smoker    Packs/day: 1.00    Years: 6.00    Pack years: 6.00    Types: Cigarettes    Quit date: 12/22/2003    Years since quitting: 15.3  . Smokeless tobacco: Never Used  . Tobacco comment: Quit  5 years  Substance and Sexual Activity  . Alcohol use: Not Currently    Comment: "once in a while"  . Drug use: No  . Sexual activity: Not Currently    Birth control/protection: Surgical    Comment: tubal and ablation  Other Topics Concern  . Not on file  Social History Narrative  . Not on file   Social Determinants of Health   Financial Resource Strain:   . Difficulty of Paying Living Expenses:  Not on file  Food Insecurity:   . Worried About Charity fundraiser in the Last Year: Not on file  . Ran Out of Food in the Last Year: Not on file  Transportation Needs:   . Lack of Transportation (Medical): Not on file  . Lack of Transportation (Non-Medical): Not on file  Physical Activity:   . Days of Exercise per Week: Not on file  . Minutes of Exercise per Session: Not on file  Stress:   . Feeling of Stress : Not on file  Social Connections:   . Frequency of Communication with Friends and Family: Not on file  . Frequency of Social Gatherings with Friends and Family: Not on file  . Attends Religious Services: Not on file  . Active Member of Clubs or Organizations: Not on file  . Attends Archivist Meetings: Not on file  . Marital Status: Not on file    Review of Systems: General: Negative for anorexia, weight loss, fever, chills, fatigue, weakness. ENT: Negative for hoarseness, difficulty swallowing. CV: Negative for chest pain, angina, palpitations, peripheral edema.  Respiratory: Negative for dyspnea at rest, cough, sputum, wheezing.  GI: See history of present illness. Endo: Negative for unusual weight change.  Heme: Negative for bruising or bleeding. Allergy: Negative for rash or hives.   Physical Exam: BP 136/82   Pulse 97   Temp  (!) 97 F (36.1 C) (Oral)   Ht 5\' 6"  (1.676 m)   Wt 187 lb (84.8 kg)   BMI 30.18 kg/m  General:   Alert and oriented. Pleasant and cooperative. Well-nourished and well-developed.  Eyes:  Without icterus, sclera clear and conjunctiva pink.  Ears:  Normal auditory acuity. Cardiovascular:  S1, S2 present without murmurs appreciated. Extremities without clubbing or edema. Respiratory:  Clear to auscultation bilaterally. No wheezes, rales, or rhonchi. No distress.  Gastrointestinal:  +BS, soft, non-tender and non-distended. No HSM noted. No guarding or rebound. No masses appreciated.  Rectal:  Deferred  Musculoskalatal:  Symmetrical without gross deformities. Neurologic:  Alert and oriented x4;  grossly normal neurologically. Psych:  Alert and cooperative. Normal mood and affect. Heme/Lymph/Immune: No excessive bruising noted.    04/08/2019 10:38 AM   Disclaimer: This note was dictated with voice recognition software. Similar sounding words can inadvertently be transcribed and may not be corrected upon review.

## 2019-04-14 ENCOUNTER — Telehealth: Payer: Self-pay

## 2019-04-14 NOTE — Telephone Encounter (Signed)
Sample of Linzess 72 mcg is ready to pick up per pts last ov. Pt will call back with an update.

## 2019-04-15 DIAGNOSIS — I10 Essential (primary) hypertension: Secondary | ICD-10-CM | POA: Diagnosis not present

## 2019-04-15 DIAGNOSIS — E1165 Type 2 diabetes mellitus with hyperglycemia: Secondary | ICD-10-CM | POA: Diagnosis not present

## 2019-04-15 DIAGNOSIS — E7849 Other hyperlipidemia: Secondary | ICD-10-CM | POA: Diagnosis not present

## 2019-04-20 ENCOUNTER — Ambulatory Visit (HOSPITAL_COMMUNITY)
Admission: RE | Admit: 2019-04-20 | Discharge: 2019-04-20 | Disposition: A | Discharge status: Home / Self Care | Payer: Medicare Other | Source: Ambulatory Visit | Attending: Internal Medicine | Admitting: Internal Medicine

## 2019-04-20 ENCOUNTER — Other Ambulatory Visit: Payer: Self-pay

## 2019-04-20 DIAGNOSIS — Z1231 Encounter for screening mammogram for malignant neoplasm of breast: Secondary | ICD-10-CM | POA: Insufficient documentation

## 2019-04-22 DIAGNOSIS — M79671 Pain in right foot: Secondary | ICD-10-CM | POA: Diagnosis not present

## 2019-04-22 DIAGNOSIS — Z1389 Encounter for screening for other disorder: Secondary | ICD-10-CM | POA: Diagnosis not present

## 2019-04-22 DIAGNOSIS — Z0000 Encounter for general adult medical examination without abnormal findings: Secondary | ICD-10-CM | POA: Diagnosis not present

## 2019-04-28 ENCOUNTER — Telehealth: Payer: Self-pay | Admitting: Gastroenterology

## 2019-04-28 DIAGNOSIS — K5901 Slow transit constipation: Secondary | ICD-10-CM

## 2019-04-28 NOTE — Telephone Encounter (Signed)
Pt said for [place] to keep the samples of Linzess 72 mcg and to call in a prescription to [facility] instead. I took what was in the sample drawer and gave to nurse.

## 2019-04-28 NOTE — Telephone Encounter (Signed)
Forwarding to Walden Field, NP to send in the Rx.

## 2019-04-29 MED ORDER — LINACLOTIDE 72 MCG PO CAPS: 72.0000 ug | ORAL_CAPSULE | Freq: Every day | ORAL | 5 refills | Status: DC

## 2019-04-29 NOTE — Telephone Encounter (Signed)
Pt is aware.  

## 2019-04-29 NOTE — Telephone Encounter (Signed)
Rx sent per request. 

## 2019-04-29 NOTE — Addendum Note (Signed)
Addended by: Gordy Levan, Keishawna Carranza A on: 04/29/2019 08:16 AM   Modules accepted: Orders

## 2019-05-19 DIAGNOSIS — E1165 Type 2 diabetes mellitus with hyperglycemia: Secondary | ICD-10-CM | POA: Diagnosis not present

## 2019-05-19 DIAGNOSIS — I10 Essential (primary) hypertension: Secondary | ICD-10-CM | POA: Diagnosis not present

## 2019-05-19 DIAGNOSIS — E7849 Other hyperlipidemia: Secondary | ICD-10-CM | POA: Diagnosis not present

## 2019-06-01 DIAGNOSIS — Z79899 Other long term (current) drug therapy: Secondary | ICD-10-CM | POA: Diagnosis not present

## 2019-06-01 DIAGNOSIS — Z7902 Long term (current) use of antithrombotics/antiplatelets: Secondary | ICD-10-CM | POA: Diagnosis not present

## 2019-06-01 DIAGNOSIS — S82492A Other fracture of shaft of left fibula, initial encounter for closed fracture: Secondary | ICD-10-CM | POA: Diagnosis not present

## 2019-06-01 DIAGNOSIS — E119 Type 2 diabetes mellitus without complications: Secondary | ICD-10-CM | POA: Diagnosis not present

## 2019-06-01 DIAGNOSIS — I10 Essential (primary) hypertension: Secondary | ICD-10-CM | POA: Diagnosis not present

## 2019-06-01 DIAGNOSIS — W1830XA Fall on same level, unspecified, initial encounter: Secondary | ICD-10-CM | POA: Diagnosis not present

## 2019-06-01 DIAGNOSIS — E7800 Pure hypercholesterolemia, unspecified: Secondary | ICD-10-CM | POA: Diagnosis not present

## 2019-06-01 DIAGNOSIS — S82452A Displaced comminuted fracture of shaft of left fibula, initial encounter for closed fracture: Secondary | ICD-10-CM | POA: Diagnosis not present

## 2019-06-01 DIAGNOSIS — Z87891 Personal history of nicotine dependence: Secondary | ICD-10-CM | POA: Diagnosis not present

## 2019-06-01 DIAGNOSIS — S82832A Other fracture of upper and lower end of left fibula, initial encounter for closed fracture: Secondary | ICD-10-CM | POA: Diagnosis not present

## 2019-06-04 DIAGNOSIS — S82402A Unspecified fracture of shaft of left fibula, initial encounter for closed fracture: Secondary | ICD-10-CM | POA: Diagnosis not present

## 2019-06-06 ENCOUNTER — Emergency Department (HOSPITAL_COMMUNITY)
Admission: EM | Admit: 2019-06-06 | Discharge: 2019-06-06 | Disposition: A | Discharge status: Home / Self Care | Payer: Medicare Other | Attending: Emergency Medicine | Admitting: Emergency Medicine

## 2019-06-06 ENCOUNTER — Other Ambulatory Visit: Payer: Self-pay

## 2019-06-06 ENCOUNTER — Encounter (HOSPITAL_COMMUNITY): Payer: Self-pay | Admitting: *Deleted

## 2019-06-06 ENCOUNTER — Ambulatory Visit
Admission: EM | Admit: 2019-06-06 | Discharge: 2019-06-06 | Disposition: A | Discharge status: Home / Self Care | Payer: Medicare Other | Source: Home / Self Care

## 2019-06-06 DIAGNOSIS — E1140 Type 2 diabetes mellitus with diabetic neuropathy, unspecified: Secondary | ICD-10-CM | POA: Diagnosis not present

## 2019-06-06 DIAGNOSIS — X58XXXD Exposure to other specified factors, subsequent encounter: Secondary | ICD-10-CM | POA: Diagnosis not present

## 2019-06-06 DIAGNOSIS — S8292XD Unspecified fracture of left lower leg, subsequent encounter for closed fracture with routine healing: Secondary | ICD-10-CM | POA: Insufficient documentation

## 2019-06-06 DIAGNOSIS — Z7982 Long term (current) use of aspirin: Secondary | ICD-10-CM | POA: Insufficient documentation

## 2019-06-06 DIAGNOSIS — Z7984 Long term (current) use of oral hypoglycemic drugs: Secondary | ICD-10-CM | POA: Diagnosis not present

## 2019-06-06 DIAGNOSIS — Z8673 Personal history of transient ischemic attack (TIA), and cerebral infarction without residual deficits: Secondary | ICD-10-CM | POA: Insufficient documentation

## 2019-06-06 DIAGNOSIS — J45909 Unspecified asthma, uncomplicated: Secondary | ICD-10-CM | POA: Diagnosis not present

## 2019-06-06 DIAGNOSIS — Z87891 Personal history of nicotine dependence: Secondary | ICD-10-CM | POA: Insufficient documentation

## 2019-06-06 DIAGNOSIS — Z79899 Other long term (current) drug therapy: Secondary | ICD-10-CM | POA: Diagnosis not present

## 2019-06-06 DIAGNOSIS — M79605 Pain in left leg: Secondary | ICD-10-CM | POA: Diagnosis present

## 2019-06-06 DIAGNOSIS — S82402D Unspecified fracture of shaft of left fibula, subsequent encounter for closed fracture with routine healing: Secondary | ICD-10-CM | POA: Diagnosis not present

## 2019-06-06 NOTE — ED Triage Notes (Signed)
Pt has left tibia fracture and was seen at [facility] on 3/1, pt states splint that was done there fell off, explained to pt that we don't have casting material here and will need to go to ED

## 2019-06-06 NOTE — ED Triage Notes (Signed)
Patient sent over from urgent care due to pain in left lower leg from a previous fracture March 1.  Patient states the old cast/wrap has come off and feels it needs to be re done.

## 2019-06-06 NOTE — ED Provider Notes (Signed)
[facility] Provider Note   CSN: [id number] Arrival date & time: 06/06/19  1416     History Chief Complaint  Patient presents with  . Leg Pain    Gevena Corrales is a 54 y.o. female.  Patient is a 53 year old female presenting to the emergency department reporting she needs a splint on her left lower extremity.  Reports on the first of this month she suffered a fracture to her left lower leg.  Had a splint in place.  Reports that she is still waiting to see orthopedics.  Reports that the splint was uncomfortable and it "fell off".  Reports she needs a new one.  Denies any numbness, tingling, increased pain or swelling        Past Medical History:  Diagnosis Date  . Abnormal uterine bleeding (AUB) 11/26/2012  . 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 Asthma   . BV (bacterial vaginosis) 11/26/2012  . Constipation   . CVA (cerebral vascular accident) ([place]) 02/09/2018  . Diabetes mellitus   . Fatigue 12/24/2012  . Fibroids 01/01/2013  . GERD (gastroesophageal reflux disease)   . Helicobacter pylori gastritis JAN 2017   EGD Bx  . Hematuria 04/05/2015  . Hemorrhoid 01/01/2013  . Hemorrhoids 04/05/2015  . Hypertension   . IBS (irritable bowel syndrome)   . Neuropathy due to secondary diabetes ([place])   . Other and unspecified ovarian cyst 01/01/2013  . Papanicolaou smear of cervix with positive high risk human papilloma virus (HPV) test 04/22/2018   Repeat in 1 year  . Sarcoidosis   . Stroke ([place]) 01/2018  . Vaginal discharge 02/12/2013   +yeast  . Vaginal itching 02/14/2015  . Vaginal odor 04/05/2015    Patient Active Problem List   Diagnosis Date Noted  . Abdominal pain 04/08/2019  . Papanicolaou smear of cervix with positive high risk human papilloma virus (HPV) test 04/22/2018  . Encounter for gynecological examination with Papanicolaou smear of cervix 04/16/2018  . History of stroke 04/16/2018  . Type 2 diabetes mellitus without complication, without  long-term current use of insulin ([place]) 04/16/2018  . Constipation 01/02/2018  . Screening for colorectal cancer 04/10/2017  . Encounter for well woman exam with routine gynecological exam 04/10/2017  . Anxiety and depression 04/10/2017  . Feeling stressed out 04/10/2017  . External hemorrhoids with complication [id number]  . Yeast vaginitis 11/17/2015  . Helicobacter pylori gastritis 07/14/2015  . BV (bacterial vaginosis) 02/14/2015  . Constipation by delayed colonic transit 11/11/2014  . Internal hemorrhoids with complication [id number]  . GERD (gastroesophageal reflux disease) 01/14/2013    Past Surgical History:  Procedure Laterality Date  . BACTERIAL OVERGROWTH TEST N/A 10/21/2015   Procedure: BACTERIAL OVERGROWTH TEST;  Surgeon: Danie Binder, MD;  [facility];  Service: Endoscopy;  Laterality: N/A;  0800  . CHOLECYSTECTOMY    . COLONOSCOPY N/A 08/20/2013   Dr. Oneida Alar: normal mucosa in terminal ileum, mild diverticulosis in ascending colon, moderate sized hemorrhoids s/p banding  . ENDOMETRIAL ABLATION    . ESOPHAGOGASTRODUODENOSCOPY N/A 04/13/2015   SLF: 1. FEDA due ot erosive gastriitis/ rectal bleeding   . FLEXIBLE SIGMOIDOSCOPY N/A 04/13/2015   SLF: 1. Rectal bleeding due to moderate sized internal hemorrhoids  . HEMORRHOID BANDING  08/20/2013   Procedure: HEMORRHOID BANDING;  Surgeon: Danie Binder, MD;  [facility];  Service: Endoscopy;;  . HEMORRHOID BANDING  04/13/2015   Procedure: Thayer Jew;  Surgeon: Danie Binder, MD;  [facility];  Service: Endoscopy;;  . HEMORRHOID SURGERY     banding  . HEMORRHOID SURGERY N/A 12/25/2013   Procedure: EXTENSIVE HEMORRHOIDECTOMY;  Surgeon: Jamesetta So, MD;  Location: [facility];  Service: General;  Laterality: N/A;  . SMART PILL PROCEDURE N/A 09/19/2015   Procedure: SMART PILL PROCEDURE;  Surgeon: Danie Binder, MD;  [facility];  Service: Endoscopy;  Laterality: N/A;  0800  .  TUBAL LIGATION       OB History    Gravida  6   Para  2   Term      Preterm      AB  4   Living  2     SAB  2   TAB      Ectopic      Multiple      Live Births  2           Family History  Problem Relation Age of Onset  . Diabetes Father   . Cancer Mother        throat  . Cancer Brother        lung  . Breast cancer Cousin   . Cancer Maternal Aunt   . Diabetes Maternal Aunt   . Colon cancer Neg Hx     Social History   Tobacco Use  . Smoking status: Former Smoker    Packs/day: 1.00    Years: 6.00    Pack years: 6.00    Types: Cigarettes    Quit date: 12/22/2003    Years since quitting: 15.4  . Smokeless tobacco: Never Used  . Tobacco comment: Quit  5 years  Substance Use Topics  . Alcohol use: Not Currently    Comment: "once in a while"  . Drug use: No    Home Medications Prior to Admission medications   Medication Sig Start Date End Date Taking? Authorizing Provider  albuterol (VENTOLIN HFA) 108 (90 Base) MCG/ACT inhaler Inhale 1-2 puffs into the lungs every 6 (six) hours as needed for wheezing or shortness of breath.    [provider]  amLODipine (NORVASC) 5 MG tablet Take 5 mg by mouth daily. 05/15/18   [provider]  aspirin EC 81 MG tablet Take 81 mg by mouth daily.    [provider]  atorvastatin (LIPITOR) 40 MG tablet Take 40 mg by mouth at bedtime. 05/14/18   [provider]  fenofibrate 160 MG tablet Take 160 mg by mouth daily. 06/03/18   [provider]  gabapentin (NEURONTIN) 300 MG capsule Take 300 mg by mouth daily. 06/10/18   [provider]  linaclotide Rolan Lipa) 72 MCG capsule Take 1 capsule (72 mcg total) by mouth daily before breakfast. 04/29/19   Carlis Stable, NP  metFORMIN (GLUCOPHAGE) 1000 MG tablet Take 1,000 mg by mouth 2 (two) times daily with a meal.    [provider]  pantoprazole (PROTONIX) 40 MG tablet Take 1 tablet (40 mg total) by mouth 2 (two) times daily  before a meal. 01/09/19   Carlis Stable, NP  polyethylene glycol (MIRALAX) 17 g packet Take 17 g by mouth daily as needed. 10/20/18   Davonna Belling, MD  Tiotropium Bromide Monohydrate (SPIRIVA RESPIMAT) 2.5 MCG/ACT AERS Inhale 2 puffs into the lungs daily.    [provider]    Allergies    Shellfish allergy and Other  Review of Systems   Review of Systems  Constitutional: Negative for chills and fever.  Musculoskeletal: Positive for arthralgias.  Skin: Negative for wound.  Neurological: Negative for numbness.    Physical Exam Updated Vital Signs BP (!) 141/72 (BP Location: Right Arm)   Pulse 99   Temp 97.9 F (36.6 C) (Oral)   Resp 19   Ht 5\' 6"  (1.676 m)   Wt 83.5 kg   SpO2 99%   BMI 29.70 kg/m   Physical Exam Vitals and nursing note reviewed.  Constitutional:      Appearance: Normal appearance.  HENT:     Head: Normocephalic and atraumatic.  Eyes:     Conjunctiva/sclera: Conjunctivae normal.  Pulmonary:     Effort: Pulmonary effort is normal.  Musculoskeletal:        General: Tenderness (LLE) present. No swelling or deformity.     Right lower leg: No edema.     Left lower leg: No edema.  Skin:    General: Skin is warm and dry.     Capillary Refill: Capillary refill takes less than 2 seconds.     Findings: No bruising or erythema.  Neurological:     Mental Status: She is alert.     Sensory: No sensory deficit.  Psychiatric:        Mood and Affect: Mood normal.     ED Results / Procedures / Treatments   Labs (all labs ordered are listed, but only abnormal results are displayed) Labs Reviewed - No data to display  EKG None  Radiology No results found.  Procedures .Splint Application  Date/Time: 06/06/2019 3:05 PM Performed by: Alveria Apley, PA-C Authorized by: Alveria Apley, PA-C   Consent:    Consent obtained:  Verbal   Consent given by:  Patient   Risks discussed:  Discoloration, numbness, swelling and pain   Alternatives  discussed:  No treatment Pre-procedure details:    Sensation:  Normal Procedure details:    Laterality:  Left   Location:  Leg   Leg:  L lower leg   Cast type:  Short leg   Supplies:  Ortho-Glass Post-procedure details:    Pain:  Unchanged   Sensation:  Normal   Patient tolerance of procedure:  Tolerated well, no immediate complications Comments:     Splint applied by tech and approved by myself.    (including critical care time)  Medications Ordered in ED Medications - No data to display  ED Course  I have reviewed the triage vital signs and the nursing notes.  Pertinent labs & imaging results that were available during my care of the patient were reviewed by me and considered in my medical decision making (see chart for details).  Clinical Course as of Jun 06 1519  Sat Jun 06, 2019  1501 Patient suffered fibular fracture on 06/01/19. Splint fell off and requesting new one. Has been suing crutches to ambulate. No new symptoms. Has f/u with orthopedics soon. Splint was replaced here in the ED.   [KM]    Clinical Course User Index [KM] Kristine Royal   MDM Rules/Calculators/A&P                      Based on review of vitals, medical screening exam, lab work and/or imaging, there does not appear to be an acute, emergent etiology for the patient's symptoms. Counseled pt on good return precautions and encouraged both PCP and ED follow-up as needed.  Prior to discharge, I also discussed incidental imaging findings with patient in detail and advised appropriate, recommended follow-up in detail.  Clinical  Impression: 1. Closed fracture of left lower leg with routine healing, subsequent encounter     Disposition: Discharge  Prior to providing a prescription for a controlled substance, I independently reviewed the patient's recent prescription history on the [place]. The patient had no recent or regular prescriptions and was  deemed appropriate for a brief, less than 3 day prescription of narcotic for acute analgesia.  This note was prepared with assistance of Systems analyst. Occasional wrong-word or sound-a-like substitutions may have occurred due to the inherent limitations of voice recognition software.  Final Clinical Impression(s) / ED Diagnoses Final diagnoses:  Closed fracture of left lower leg with routine healing, subsequent encounter    Rx / DC Orders ED Discharge Orders    None       Kristine Royal 06/06/19 1521    Davonna Belling, MD 06/07/19 1429

## 2019-06-11 ENCOUNTER — Ambulatory Visit (INDEPENDENT_AMBULATORY_CARE_PROVIDER_SITE_OTHER): Payer: Medicare Other | Admitting: Orthopaedic Surgery

## 2019-06-11 ENCOUNTER — Ambulatory Visit: Payer: Self-pay

## 2019-06-11 ENCOUNTER — Encounter: Payer: Self-pay | Admitting: Orthopaedic Surgery

## 2019-06-11 VITALS — BP 127/86 | HR 111 | Ht 67.0 in | Wt 184.0 lb

## 2019-06-11 DIAGNOSIS — S82832A Other fracture of upper and lower end of left fibula, initial encounter for closed fracture: Secondary | ICD-10-CM | POA: Diagnosis not present

## 2019-06-11 DIAGNOSIS — M25572 Pain in left ankle and joints of left foot: Secondary | ICD-10-CM | POA: Diagnosis not present

## 2019-06-11 NOTE — Progress Notes (Addendum)
Office Visit Note   Patient: [name]           Date of Birth: [birth year]           MRN: [id number] Visit Date: 06/11/2019              Requested by: [name], MD [place],  [place] [id number] PCP: [name], MD   Assessment & Plan: Visit Diagnoses:  1. Pain in left ankle and joints of left foot   2. Closed fracture of proximal end of left fibula, unspecified fracture morphology, initial encounter     Plan: Short knee immobilizer applied she can remove it for bathing she should be able to ambulate with the knee immobilizer on in the house use her crutches for extended ambulating.  She can remove the knee immobilizer to drive.  Her knee ligaments are stable and she should get resolution of her symptoms over a few weeks.  I plan to recheck her in 3 to 4 weeks.  Follow-Up Instructions: Return in about 4 weeks (around 07/09/2019).   Orders:  Orders Placed This Encounter  Procedures  . XR Ankle Complete Left   No orders of the defined types were placed in this encounter.     Procedures: No procedures performed   Clinical Data: No additional findings.   Subjective: Chief Complaint  Patient presents with  . Left Leg - Fracture    DOI 06/01/2019    HPI 54 year old female states she slipped going down a hill on 06/01/2019 with had pain with weightbearing.  She had been using crutches taken over-the-counter medication.  X-rays were obtained in the emergency room and she has been referred to me by Dr.Hasanaj.  She states she has pain down at the ankle as an Ace wrap on.  She is use crutches appropriately.  She is used Ultram for pain.  No past history of but knee or ankle injury that she can recall.  Patient states she does not work and is disabled.  Review of Systems  .  Review of systems updated from Dr. Sherrie Sport  note 06/04/2019 and noncontributory to HPI   Objective: Vital Signs: BP 127/86   Pulse (!) 111   Ht 5\' 7"  (1.702 m)   Wt 184 lb (83.5  kg)   BMI 28.82 kg/m   Physical Exam Constitutional:      Appearance: She is well-developed.  HENT:     Head: Normocephalic.     Right Ear: External ear normal.     Left Ear: External ear normal.  Eyes:     Pupils: Pupils are equal, round, and reactive to light.  Neck:     Thyroid: No thyromegaly.     Trachea: No tracheal deviation.  Cardiovascular:     Rate and Rhythm: Normal rate.  Pulmonary:     Effort: Pulmonary effort is normal.  Abdominal:     Palpations: Abdomen is soft.  Skin:    General: Skin is warm and dry.  Neurological:     Mental Status: She is alert and oriented to person, place, and time.  Psychiatric:        Behavior: Behavior normal.     Ortho Exam patient has tenderness over the proximal left fibula.  Common peroneal nerve is active good ankle dorsiflexion plantarflexion.  Mild tenderness over the anterior ankle joint without an effusion.  Medial deltoid ligament is normal negative anterior drawer subtalar motion is normal distal pulses are normal. Specialty  Comments:  No specialty comments available.  Imaging: Previous x-rays are reviewed which shows proximal fibular fracture without significant angulation and mild comminution.  Tibial plateau is normal.   hree-view x-rays left ankle obtained and reviewed this is negative for injury no degenerative changes are noted.  Impression: Normal left ankle x-rays.   PMFS History: Patient Active Problem List   Diagnosis Date Noted  . Fracture of left proximal fibula 06/11/2019  . Abdominal pain 04/08/2019  . Papanicolaou smear of cervix with positive high risk human papilloma virus (HPV) test 04/22/2018  . Encounter for gynecological examination with Papanicolaou smear of cervix 04/16/2018  . History of stroke 04/16/2018  . Type 2 diabetes mellitus without complication, without long-term current use of insulin ([place]) 04/16/2018  . Constipation 01/02/2018  . Screening for colorectal cancer 04/10/2017  .  Encounter for well woman exam with routine gynecological exam 04/10/2017  . Anxiety and depression 04/10/2017  . Feeling stressed out 04/10/2017  . External hemorrhoids with complication [id number]  . Yeast vaginitis 11/17/2015  . Helicobacter pylori gastritis 07/14/2015  . BV (bacterial vaginosis) 02/14/2015  . Constipation by delayed colonic transit 11/11/2014  . Internal hemorrhoids with complication 123XX123  . GERD (gastroesophageal reflux disease) 01/14/2013   Past Medical History:  Diagnosis Date  . Abnormal uterine bleeding (AUB) 11/26/2012  . 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 Asthma   . BV (bacterial vaginosis) 11/26/2012  . Constipation   . CVA (cerebral vascular accident) ([place]) 02/09/2018  . Diabetes mellitus   . Fatigue 12/24/2012  . Fibroids 01/01/2013  . GERD (gastroesophageal reflux disease)   . Helicobacter pylori gastritis JAN 2017   EGD Bx  . Hematuria 04/05/2015  . Hemorrhoid 01/01/2013  . Hemorrhoids 04/05/2015  . Hypertension   . IBS (irritable bowel syndrome)   . Neuropathy due to secondary diabetes ([place])   . Other and unspecified ovarian cyst 01/01/2013  . Papanicolaou smear of cervix with positive high risk human papilloma virus (HPV) test 04/22/2018   Repeat in 1 year  . Sarcoidosis   . Stroke ([place]) 01/2018  . Vaginal discharge 02/12/2013   +yeast  . Vaginal itching 02/14/2015  . Vaginal odor 04/05/2015    Family History  Problem Relation Age of Onset  . Diabetes Father   . Cancer Mother        throat  . Cancer Brother        lung  . Breast cancer Cousin   . Cancer Maternal Aunt   . Diabetes Maternal Aunt   . Colon cancer Neg Hx     Past Surgical History:  Procedure Laterality Date  . BACTERIAL OVERGROWTH TEST N/A 10/21/2015   Procedure: BACTERIAL OVERGROWTH TEST;  Surgeon: Danie Binder, MD;  [facility];  Service: Endoscopy;  Laterality: N/A;  0800  . CHOLECYSTECTOMY    . COLONOSCOPY N/A 08/20/2013   Dr. Oneida Alar: normal  mucosa in terminal ileum, mild diverticulosis in ascending colon, moderate sized hemorrhoids s/p banding  . ENDOMETRIAL ABLATION    . ESOPHAGOGASTRODUODENOSCOPY N/A 04/13/2015   SLF: 1. FEDA due ot erosive gastriitis/ rectal bleeding   . FLEXIBLE SIGMOIDOSCOPY N/A 04/13/2015   SLF: 1. Rectal bleeding due to moderate sized internal hemorrhoids  . HEMORRHOID BANDING  08/20/2013   Procedure: HEMORRHOID BANDING;  Surgeon: Danie Binder, MD;  [facility];  Service: Endoscopy;;  . HEMORRHOID BANDING  04/13/2015   Procedure: Thayer Jew;  Surgeon: Danie Binder, MD;  [facility];  Service: Endoscopy;;  . HEMORRHOID SURGERY     banding  . HEMORRHOID SURGERY N/A 12/25/2013   Procedure: EXTENSIVE HEMORRHOIDECTOMY;  Surgeon: Jamesetta So, MD;  Location: [facility];  Service: General;  Laterality: N/A;  . SMART PILL PROCEDURE N/A 09/19/2015   Procedure: SMART PILL PROCEDURE;  Surgeon: Danie Binder, MD;  [facility];  Service: Endoscopy;  Laterality: N/A;  0800  . TUBAL LIGATION     Social History   Occupational History    Employer: UNEMPLOYED  Tobacco Use  . Smoking status: Former Smoker    Packs/day: 1.00    Years: 6.00    Pack years: 6.00    Types: Cigarettes    Quit date: 12/22/2003    Years since quitting: 15.4  . Smokeless tobacco: Never Used  . Tobacco comment: Quit  5 years  Substance and Sexual Activity  . Alcohol use: Not Currently    Comment: "once in a while"  . Drug use: No  . Sexual activity: Not Currently    Birth control/protection: Surgical    Comment: tubal and ablation

## 2019-06-18 ENCOUNTER — Telehealth: Payer: Self-pay | Admitting: Radiology

## 2019-06-18 MED ORDER — TRAMADOL HCL 50 MG PO TABS: 50.0000 mg | ORAL_TABLET | Freq: Two times a day (BID) | ORAL | 0 refills | Status: DC | PRN

## 2019-06-18 NOTE — Telephone Encounter (Signed)
Patient requests pain medication for foot pain.  She uses [facility] in [place]. Please advise.

## 2019-06-18 NOTE — Addendum Note (Signed)
Addended by: Meyer Cory on: 06/18/2019 04:51 PM   Modules accepted: Orders

## 2019-06-18 NOTE — Telephone Encounter (Signed)
Called to pharmacy 

## 2019-06-18 NOTE — Telephone Encounter (Signed)
Ok call in # 15 tramadol one po bid prn pain thanks

## 2019-06-18 NOTE — Telephone Encounter (Signed)
I called patient to advise. She states that she is allergic to tramadol. She was taking it, but had extreme itching and Dr. Sherrie Sport told her to discontinue. Is there something else that you would like for me to call in?

## 2019-06-19 ENCOUNTER — Telehealth: Payer: Self-pay | Admitting: Radiology

## 2019-06-19 DIAGNOSIS — Z1159 Encounter for screening for other viral diseases: Secondary | ICD-10-CM | POA: Diagnosis not present

## 2019-06-19 NOTE — Telephone Encounter (Signed)
See other note. Time to go to tylenol and continue ice.

## 2019-06-19 NOTE — Telephone Encounter (Signed)
I called and discussed.  Now more than 2 and half weeks since original injury.  She can use Tylenol or ibuprofen.  She states she fell when she is trying to get out of the shower and I discussed with her taking more pain medication will increase her risk for falling not decrease it.  She does not want to use the Ultram so she can use Tylenol and can continue to use some ice.  Follow-up as planned.  She understands and agrees.FYI

## 2019-06-19 NOTE — Telephone Encounter (Signed)
Rx for Tramadol was called in to [facility] yesterday for patient. When speaking to her on the phone to advise, patient stated that she is allergic to tramadol and that she had taken it before but Dr. Sherrie Sport discontinued it due to severe itching.  She would like to know if you could send something else in?

## 2019-06-19 NOTE — Telephone Encounter (Signed)
noted 

## 2019-06-23 DIAGNOSIS — E7849 Other hyperlipidemia: Secondary | ICD-10-CM | POA: Diagnosis not present

## 2019-06-23 DIAGNOSIS — E1165 Type 2 diabetes mellitus with hyperglycemia: Secondary | ICD-10-CM | POA: Diagnosis not present

## 2019-06-23 DIAGNOSIS — I10 Essential (primary) hypertension: Secondary | ICD-10-CM | POA: Diagnosis not present

## 2019-06-24 DIAGNOSIS — D869 Sarcoidosis, unspecified: Secondary | ICD-10-CM | POA: Diagnosis not present

## 2019-06-24 DIAGNOSIS — J411 Mucopurulent chronic bronchitis: Secondary | ICD-10-CM | POA: Diagnosis not present

## 2019-06-24 DIAGNOSIS — R0602 Shortness of breath: Secondary | ICD-10-CM | POA: Diagnosis not present

## 2019-07-09 DIAGNOSIS — J984 Other disorders of lung: Secondary | ICD-10-CM | POA: Diagnosis not present

## 2019-07-09 DIAGNOSIS — R918 Other nonspecific abnormal finding of lung field: Secondary | ICD-10-CM | POA: Diagnosis not present

## 2019-07-09 DIAGNOSIS — D869 Sarcoidosis, unspecified: Secondary | ICD-10-CM | POA: Diagnosis not present

## 2019-07-16 ENCOUNTER — Ambulatory Visit (INDEPENDENT_AMBULATORY_CARE_PROVIDER_SITE_OTHER): Payer: Medicare Other | Admitting: Orthopaedic Surgery

## 2019-07-16 ENCOUNTER — Encounter: Payer: Self-pay | Admitting: Orthopaedic Surgery

## 2019-07-16 ENCOUNTER — Other Ambulatory Visit: Payer: Self-pay

## 2019-07-16 VITALS — BP 117/81 | HR 90 | Ht 66.0 in | Wt 182.0 lb

## 2019-07-16 DIAGNOSIS — S82832D Other fracture of upper and lower end of left fibula, subsequent encounter for closed fracture with routine healing: Secondary | ICD-10-CM

## 2019-07-16 NOTE — Progress Notes (Signed)
Office Visit Note   Patient: [name]           Date of Birth: 01-Feb-1966           MRN: [id number] Visit Date: 07/16/2019              Requested by: [name], MD [place],  [place] [id number] PCP: [name], MD   Assessment & Plan: Visit Diagnoses:  1. Closed fracture of proximal end of left fibula with routine healing, unspecified fracture morphology, subsequent encounter     Plan: Patient continue to work on straight leg raising make sure she has full flexion work on extension of her knee and return as needed.  She is happy with the results of treatment and is ambulating without the knee immobilizer at home.  She can wean herself out of the knee immobilizer and return if she has further problems.  She is happy with the results of treatment.  Follow-Up Instructions: No follow-ups on file.   Orders:  No orders of the defined types were placed in this encounter.  No orders of the defined types were placed in this encounter.     Procedures: No procedures performed   Clinical Data: No additional findings.   Subjective: Chief Complaint  Patient presents with  . Left Leg - Follow-up, Pain, Fracture    DOI 06/01/2019    HPI patient returns now 6 weeks post left proximal fibular fracture.  She can ambulate without the knee immobilizer.  Still has slight tenderness at the area of the fracture.  She has been removing the knee immobilizer for bathing working on knee flexion extension.  Review of Systems 14 point systems updated unchanged from last office visit other than as mentioned HPI.   Objective: Vital Signs: BP 117/81   Pulse 90   Ht 5\' 6"  (1.676 m)   Wt 182 lb (82.6 kg)   BMI 29.38 kg/m   Physical Exam Constitutional:      Appearance: She is well-developed.  HENT:     Head: Normocephalic.     Right Ear: External ear normal.     Left Ear: External ear normal.  Eyes:     Pupils: Pupils are equal, round, and reactive to light.   Neck:     Thyroid: No thyromegaly.     Trachea: No tracheal deviation.  Cardiovascular:     Rate and Rhythm: Normal rate.  Pulmonary:     Effort: Pulmonary effort is normal.  Abdominal:     Palpations: Abdomen is soft.  Skin:    General: Skin is warm and dry.  Neurological:     Mental Status: She is alert and oriented to person, place, and time.  Psychiatric:        Behavior: Behavior normal.     Ortho Exam patient has no knee effusion.  There is tenderness over the left proximal fibula at the fracture site.  Peroneal motor and sensory function to the foot is intact.  No swelling of the leg.  Hip range of motion is normal.  Specialty Comments:  No specialty comments available.  Imaging: No results found.   PMFS History: Patient Active Problem List   Diagnosis Date Noted  . Fracture of left proximal fibula 06/11/2019  . Abdominal pain 04/08/2019  . Papanicolaou smear of cervix with positive high risk human papilloma virus (HPV) test 04/22/2018  . Encounter for gynecological examination with Papanicolaou smear of cervix 04/16/2018  . History of  stroke 04/16/2018  . Type 2 diabetes mellitus without complication, without long-term current use of insulin ([place]) 04/16/2018  . Constipation 01/02/2018  . Screening for colorectal cancer 04/10/2017  . Encounter for well woman exam with routine gynecological exam 04/10/2017  . Anxiety and depression 04/10/2017  . Feeling stressed out 04/10/2017  . External hemorrhoids with complication Q000111Q  . Yeast vaginitis 11/17/2015  . Helicobacter pylori gastritis 07/14/2015  . BV (bacterial vaginosis) 02/14/2015  . Constipation by delayed colonic transit 11/11/2014  . Internal hemorrhoids with complication 123XX123  . GERD (gastroesophageal reflux disease) 01/14/2013   Past Medical History:  Diagnosis Date  . Abnormal uterine bleeding (AUB) 11/26/2012  . 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 Asthma   . BV (bacterial  vaginosis) 11/26/2012  . Constipation   . CVA (cerebral vascular accident) ([place]) 02/09/2018  . Diabetes mellitus   . Fatigue 12/24/2012  . Fibroids 01/01/2013  . GERD (gastroesophageal reflux disease)   . Helicobacter pylori gastritis JAN 2017   EGD Bx  . Hematuria 04/05/2015  . Hemorrhoid 01/01/2013  . Hemorrhoids 04/05/2015  . Hypertension   . IBS (irritable bowel syndrome)   . Neuropathy due to secondary diabetes ([place])   . Other and unspecified ovarian cyst 01/01/2013  . Papanicolaou smear of cervix with positive high risk human papilloma virus (HPV) test 04/22/2018   Repeat in 1 year  . Sarcoidosis   . Stroke ([place]) 01/2018  . Vaginal discharge 02/12/2013   +yeast  . Vaginal itching 02/14/2015  . Vaginal odor 04/05/2015    Family History  Problem Relation Age of Onset  . Diabetes Father   . Cancer Mother        throat  . Cancer Brother        lung  . Breast cancer Cousin   . Cancer Maternal Aunt   . Diabetes Maternal Aunt   . Colon cancer Neg Hx     Past Surgical History:  Procedure Laterality Date  . BACTERIAL OVERGROWTH TEST N/A 10/21/2015   Procedure: BACTERIAL OVERGROWTH TEST;  Surgeon: Danie Binder, MD;  [facility];  Service: Endoscopy;  Laterality: N/A;  0800  . CHOLECYSTECTOMY    . COLONOSCOPY N/A 08/20/2013   Dr. Oneida Alar: normal mucosa in terminal ileum, mild diverticulosis in ascending colon, moderate sized hemorrhoids s/p banding  . ENDOMETRIAL ABLATION    . ESOPHAGOGASTRODUODENOSCOPY N/A 04/13/2015   SLF: 1. FEDA due ot erosive gastriitis/ rectal bleeding   . FLEXIBLE SIGMOIDOSCOPY N/A 04/13/2015   SLF: 1. Rectal bleeding due to moderate sized internal hemorrhoids  . HEMORRHOID BANDING  08/20/2013   Procedure: HEMORRHOID BANDING;  Surgeon: Danie Binder, MD;  [facility];  Service: Endoscopy;;  . HEMORRHOID BANDING  04/13/2015   Procedure: Thayer Jew;  Surgeon: Danie Binder, MD;  [facility];  Service: Endoscopy;;  .  HEMORRHOID SURGERY     banding  . HEMORRHOID SURGERY N/A 12/25/2013   Procedure: EXTENSIVE HEMORRHOIDECTOMY;  Surgeon: Jamesetta So, MD;  Location: [facility];  Service: General;  Laterality: N/A;  . SMART PILL PROCEDURE N/A 09/19/2015   Procedure: SMART PILL PROCEDURE;  Surgeon: Danie Binder, MD;  [facility];  Service: Endoscopy;  Laterality: N/A;  0800  . TUBAL LIGATION     Social History   Occupational History    Employer: UNEMPLOYED  Tobacco Use  . Smoking status: Former Smoker    Packs/day: 1.00    Years: 6.00  Pack years: 6.00    Types: Cigarettes    Quit date: 12/22/2003    Years since quitting: 15.5  . Smokeless tobacco: Never Used  . Tobacco comment: Quit  5 years  Substance and Sexual Activity  . Alcohol use: Not Currently    Comment: "once in a while"  . Drug use: No  . Sexual activity: Not Currently    Birth control/protection: Surgical    Comment: tubal and ablation

## 2019-07-17 DIAGNOSIS — E1165 Type 2 diabetes mellitus with hyperglycemia: Secondary | ICD-10-CM | POA: Diagnosis not present

## 2019-07-17 DIAGNOSIS — E7849 Other hyperlipidemia: Secondary | ICD-10-CM | POA: Diagnosis not present

## 2019-07-17 DIAGNOSIS — I10 Essential (primary) hypertension: Secondary | ICD-10-CM | POA: Diagnosis not present

## 2019-07-28 DIAGNOSIS — S82402A Unspecified fracture of shaft of left fibula, initial encounter for closed fracture: Secondary | ICD-10-CM | POA: Diagnosis not present

## 2019-07-28 DIAGNOSIS — R7303 Prediabetes: Secondary | ICD-10-CM | POA: Diagnosis not present

## 2019-07-28 DIAGNOSIS — I679 Cerebrovascular disease, unspecified: Secondary | ICD-10-CM | POA: Diagnosis not present

## 2019-07-28 DIAGNOSIS — I10 Essential (primary) hypertension: Secondary | ICD-10-CM | POA: Diagnosis not present

## 2019-07-28 DIAGNOSIS — E785 Hyperlipidemia, unspecified: Secondary | ICD-10-CM | POA: Diagnosis not present

## 2019-07-28 DIAGNOSIS — Z0000 Encounter for general adult medical examination without abnormal findings: Secondary | ICD-10-CM | POA: Diagnosis not present

## 2019-08-06 DIAGNOSIS — E785 Hyperlipidemia, unspecified: Secondary | ICD-10-CM | POA: Diagnosis not present

## 2019-08-06 DIAGNOSIS — S82402A Unspecified fracture of shaft of left fibula, initial encounter for closed fracture: Secondary | ICD-10-CM | POA: Diagnosis not present

## 2019-08-06 DIAGNOSIS — I10 Essential (primary) hypertension: Secondary | ICD-10-CM | POA: Diagnosis not present

## 2019-09-01 DIAGNOSIS — E785 Hyperlipidemia, unspecified: Secondary | ICD-10-CM | POA: Diagnosis not present

## 2019-09-01 DIAGNOSIS — S82402A Unspecified fracture of shaft of left fibula, initial encounter for closed fracture: Secondary | ICD-10-CM | POA: Diagnosis not present

## 2019-09-01 DIAGNOSIS — I10 Essential (primary) hypertension: Secondary | ICD-10-CM | POA: Diagnosis not present

## 2019-10-06 ENCOUNTER — Ambulatory Visit: Payer: Medicare Other | Admitting: Nurse Practitioner

## 2019-10-08 DIAGNOSIS — E785 Hyperlipidemia, unspecified: Secondary | ICD-10-CM | POA: Diagnosis not present

## 2019-10-08 DIAGNOSIS — I10 Essential (primary) hypertension: Secondary | ICD-10-CM | POA: Diagnosis not present

## 2019-10-28 DIAGNOSIS — E1151 Type 2 diabetes mellitus with diabetic peripheral angiopathy without gangrene: Secondary | ICD-10-CM | POA: Diagnosis not present

## 2019-10-28 DIAGNOSIS — E785 Hyperlipidemia, unspecified: Secondary | ICD-10-CM | POA: Diagnosis not present

## 2019-10-28 DIAGNOSIS — Z8673 Personal history of transient ischemic attack (TIA), and cerebral infarction without residual deficits: Secondary | ICD-10-CM | POA: Diagnosis not present

## 2019-10-28 DIAGNOSIS — I10 Essential (primary) hypertension: Secondary | ICD-10-CM | POA: Diagnosis not present

## 2019-11-01 DIAGNOSIS — R112 Nausea with vomiting, unspecified: Secondary | ICD-10-CM | POA: Diagnosis not present

## 2019-11-01 DIAGNOSIS — B348 Other viral infections of unspecified site: Secondary | ICD-10-CM | POA: Diagnosis not present

## 2019-11-01 DIAGNOSIS — Z20822 Contact with and (suspected) exposure to covid-19: Secondary | ICD-10-CM | POA: Diagnosis not present

## 2019-11-01 DIAGNOSIS — R05 Cough: Secondary | ICD-10-CM | POA: Diagnosis not present

## 2019-11-04 DIAGNOSIS — E1151 Type 2 diabetes mellitus with diabetic peripheral angiopathy without gangrene: Secondary | ICD-10-CM | POA: Diagnosis not present

## 2019-11-04 DIAGNOSIS — E785 Hyperlipidemia, unspecified: Secondary | ICD-10-CM | POA: Diagnosis not present

## 2019-11-04 DIAGNOSIS — I10 Essential (primary) hypertension: Secondary | ICD-10-CM | POA: Diagnosis not present

## 2019-11-26 ENCOUNTER — Other Ambulatory Visit: Payer: Self-pay

## 2019-11-26 ENCOUNTER — Ambulatory Visit: Payer: Medicare Other | Admitting: Orthopaedic Surgery

## 2019-11-30 DIAGNOSIS — Z7984 Long term (current) use of oral hypoglycemic drugs: Secondary | ICD-10-CM | POA: Diagnosis not present

## 2019-11-30 DIAGNOSIS — H04123 Dry eye syndrome of bilateral lacrimal glands: Secondary | ICD-10-CM | POA: Diagnosis not present

## 2019-11-30 DIAGNOSIS — E119 Type 2 diabetes mellitus without complications: Secondary | ICD-10-CM | POA: Diagnosis not present

## 2019-12-01 ENCOUNTER — Other Ambulatory Visit: Payer: Self-pay

## 2019-12-01 ENCOUNTER — Ambulatory Visit (INDEPENDENT_AMBULATORY_CARE_PROVIDER_SITE_OTHER): Payer: Medicare Other | Admitting: Nurse Practitioner

## 2019-12-01 ENCOUNTER — Encounter: Payer: Self-pay | Admitting: Nurse Practitioner

## 2019-12-01 VITALS — BP 141/87 | HR 100 | Temp 96.9°F | Ht 65.0 in | Wt 183.0 lb

## 2019-12-01 DIAGNOSIS — K649 Unspecified hemorrhoids: Secondary | ICD-10-CM

## 2019-12-01 DIAGNOSIS — K5901 Slow transit constipation: Secondary | ICD-10-CM | POA: Diagnosis not present

## 2019-12-01 DIAGNOSIS — K219 Gastro-esophageal reflux disease without esophagitis: Secondary | ICD-10-CM

## 2019-12-01 MED ORDER — HYDROCORTISONE (PERIANAL) 2.5 % EX CREA: 1.0000 "application " | TOPICAL_CREAM | Freq: Two times a day (BID) | CUTANEOUS | 1 refills | Status: DC | PRN

## 2019-12-01 NOTE — Progress Notes (Signed)
Referring Provider: [name], MD Primary Care Physician:  [name], MD Primary GI:  Dr. Abbey Chatters  Chief Complaint  Patient presents with  . Gastroesophageal Reflux    "all the time"  . rectal burning    occurs after bm    HPI:   [name] is a 54 y.o. female who presents for follow-up on GERD. The patient was last seen in our office 04/08/2019 for GERD and constipation as well as lower abdominal pain.  Noted chronic history of GERD, constipation, hemorrhoids.  Previously treated with MiraLAX and Amitiza.  Previously treated with Prilosec.  She was switched to Protonix 40 mg twice daily.  At her last visit her GERD was well managed, only taking MiraLAX with sensation of incomplete emptying although has daily bowel movements.  Linzess 290 mcg previously was too much and is never tried the low-dose of 72 mcg option.  No other overt GI complaints.  Recommended trial of Linzess 72 mcg, continue PPI, request progress report related to Linzess effectiveness, stop MiraLAX and starting Linzess, follow-up in 6 months.  The patient called our office 04/28/2019 indicating Linzess was working well and requested prescription which was sent to her pharmacy.  Today she states she is doing okay overall. She is having frequent GERD symptoms despite Protonix bid. GERD started worsening about a month ago, no obvious changes in medications or diet. She is not on any NSAIDs. Her GERD symptoms includes chest burning, reflux into the throat/bitter and sour taste. Not sure if it's related to diet, tries to avoid triggers. Symptoms are random, not necessarily postprandial. Daytime symptoms tend to be more frequent/worse. Drinks no alcohol other than rarely when visiting sister in [place]. Also with rectal burning after a bowel movement. Thinks they're related to hemorrhoids flaring up. In 2017 had moderate internal hemorrhoids s/p banding. She had surgery following that for recurrent hemorrhoids. Has a  bowel movement about every other day, sometimes soft/passes easily; sometimes "like little balls" at which point she has straining. Rectal pain is worse when she has hard stool/straining. Not on anything for constipation. Denies minimal water; does eat at least 3 servings of fruits/veggies a day. Has tried Vaseline but nothing else. Denies hematochezia, melena. No other abdominal pain other than reflux symptoms. Denies N/V, fever, chills, unintentional weight loss.  She does have an Rx for Linzess which causes diarrhea. Uses MiraLAX as needed.  Past Medical History:  Diagnosis Date  . Abnormal uterine bleeding (AUB) 11/26/2012  . 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 Asthma   . BV (bacterial vaginosis) 11/26/2012  . Constipation   . CVA (cerebral vascular accident) ([place]) 02/09/2018  . Diabetes mellitus   . Fatigue 12/24/2012  . Fibroids 01/01/2013  . GERD (gastroesophageal reflux disease)   . Helicobacter pylori gastritis JAN 2017   EGD Bx  . Hematuria 04/05/2015  . Hemorrhoid 01/01/2013  . Hemorrhoids 04/05/2015  . Hypertension   . IBS (irritable bowel syndrome)   . Neuropathy due to secondary diabetes ([place])   . Other and unspecified ovarian cyst 01/01/2013  . Papanicolaou smear of cervix with positive high risk human papilloma virus (HPV) test 04/22/2018   Repeat in 1 year  . Sarcoidosis   . Stroke ([place]) 01/2018  . Vaginal discharge 02/12/2013   +yeast  . Vaginal itching 02/14/2015  . Vaginal odor 04/05/2015    Past Surgical History:  Procedure Laterality Date  . BACTERIAL OVERGROWTH TEST N/A 10/21/2015   Procedure: BACTERIAL OVERGROWTH TEST;  Surgeon: Danie Binder, MD;  [facility];  Service: Endoscopy;  Laterality: N/A;  0800  . CHOLECYSTECTOMY    . COLONOSCOPY N/A 08/20/2013   Dr. Oneida Alar: normal mucosa in terminal ileum, mild diverticulosis in ascending colon, moderate sized hemorrhoids s/p banding  . ENDOMETRIAL ABLATION    . ESOPHAGOGASTRODUODENOSCOPY N/A  04/13/2015   SLF: 1. FEDA due ot erosive gastriitis/ rectal bleeding   . FLEXIBLE SIGMOIDOSCOPY N/A 04/13/2015   SLF: 1. Rectal bleeding due to moderate sized internal hemorrhoids  . HEMORRHOID BANDING  08/20/2013   Procedure: HEMORRHOID BANDING;  Surgeon: Danie Binder, MD;  [facility];  Service: Endoscopy;;  . HEMORRHOID BANDING  04/13/2015   Procedure: Thayer Jew;  Surgeon: Danie Binder, MD;  [facility];  Service: Endoscopy;;  . HEMORRHOID SURGERY     banding  . HEMORRHOID SURGERY N/A 12/25/2013   Procedure: EXTENSIVE HEMORRHOIDECTOMY;  Surgeon: Jamesetta So, MD;  Location: [facility];  Service: General;  Laterality: N/A;  . SMART PILL PROCEDURE N/A 09/19/2015   Procedure: SMART PILL PROCEDURE;  Surgeon: Danie Binder, MD;  [facility];  Service: Endoscopy;  Laterality: N/A;  0800  . TUBAL LIGATION      Current Outpatient Medications  Medication Sig Dispense Refill  . albuterol (VENTOLIN HFA) 108 (90 Base) MCG/ACT inhaler Inhale 1-2 puffs into the lungs every 6 (six) hours as needed for wheezing or shortness of breath.    Marland Kitchen amLODipine (NORVASC) 5 MG tablet Take 5 mg by mouth daily.    Marland Kitchen aspirin EC 81 MG tablet Take 81 mg by mouth daily.    Marland Kitchen atorvastatin (LIPITOR) 40 MG tablet Take 40 mg by mouth at bedtime.    . clopidogrel (PLAVIX) 75 MG tablet Take 75 mg by mouth daily.    . DULoxetine (CYMBALTA) 60 MG capsule Take 60 mg by mouth daily.    . fenofibrate 160 MG tablet Take 160 mg by mouth daily.    Marland Kitchen gabapentin (NEURONTIN) 300 MG capsule Take 300 mg by mouth daily.    Marland Kitchen linaclotide (LINZESS) 72 MCG capsule Take 1 capsule (72 mcg total) by mouth daily before breakfast. 30 capsule 5  . metFORMIN (GLUCOPHAGE) 1000 MG tablet Take 1,000 mg by mouth 2 (two) times daily with a meal.    . pantoprazole (PROTONIX) 40 MG tablet Take 1 tablet (40 mg total) by mouth 2 (two) times daily before a meal. 60 tablet 3  . polyethylene glycol (MIRALAX) 17 g  packet Take 17 g by mouth daily as needed. 14 each 0  . Tiotropium Bromide Monohydrate (SPIRIVA RESPIMAT) 2.5 MCG/ACT AERS Inhale 2 puffs into the lungs daily.     No current facility-administered medications for this visit.    Allergies as of 12/01/2019 - Review Complete 12/01/2019  Allergen Reaction Noted  . Shellfish allergy Anaphylaxis 12/15/2011  . Other Itching 07/14/2015  . Tramadol  06/18/2019    Family History  Problem Relation Age of Onset  . Diabetes Father   . Cancer Mother        throat  . Cancer Brother        lung  . Breast cancer Cousin   . Cancer Maternal Aunt   . Diabetes Maternal Aunt   . Colon cancer Neg Hx     Social History   Socioeconomic History  . Marital status: Single    Spouse name: Not on file  . Number of children: 2  .  Years of education: Not on file  . Highest education level: Not on file  Occupational History    Employer: UNEMPLOYED  Tobacco Use  . Smoking status: Former Smoker    Packs/day: 1.00    Years: 6.00    Pack years: 6.00    Types: Cigarettes    Quit date: 12/22/2003    Years since quitting: 15.9  . Smokeless tobacco: Never Used  . Tobacco comment: Quit  5 years  Vaping Use  . Vaping Use: Never used  Substance and Sexual Activity  . Alcohol use: Yes    Comment: "once in a while"  . Drug use: No  . Sexual activity: Not Currently    Birth control/protection: Surgical    Comment: tubal and ablation  Other Topics Concern  . Not on file  Social History Narrative  . Not on file   Social Determinants of Health   Financial Resource Strain:   . Difficulty of Paying Living Expenses: Not on file  Food Insecurity:   . Worried About Charity fundraiser in the Last Year: Not on file  . Ran Out of Food in the Last Year: Not on file  Transportation Needs:   . Lack of Transportation (Medical): Not on file  . Lack of Transportation (Non-Medical): Not on file  Physical Activity:   . Days of Exercise per Week: Not on file  .  Minutes of Exercise per Session: Not on file  Stress:   . Feeling of Stress : Not on file  Social Connections:   . Frequency of Communication with Friends and Family: Not on file  . Frequency of Social Gatherings with Friends and Family: Not on file  . Attends Religious Services: Not on file  . Active Member of Clubs or Organizations: Not on file  . Attends Archivist Meetings: Not on file  . Marital Status: Not on file    Subjective: Review of Systems  Constitutional: Negative for chills, fever, malaise/fatigue and weight loss.  HENT: Negative for congestion and sore throat.   Respiratory: Negative for cough and shortness of breath.   Cardiovascular: Negative for chest pain and palpitations.  Gastrointestinal: Positive for constipation and heartburn. Negative for abdominal pain, blood in stool, diarrhea, melena, nausea and vomiting.  Musculoskeletal: Negative for joint pain and myalgias.  Skin: Negative for rash.  Neurological: Negative for dizziness and weakness.  Endo/Heme/Allergies: Does not bruise/bleed easily.  Psychiatric/Behavioral: Negative for depression. The patient is not nervous/anxious.   All other systems reviewed and are negative.    Objective: BP (!) 141/87   Pulse 100   Temp (!) 96.9 F (36.1 C) (Temporal)   Ht 5\' 5"  (1.651 m)   Wt 183 lb (83 kg)   BMI 30.45 kg/m  Physical Exam Vitals and nursing note reviewed.  Constitutional:      General: She is not in acute distress.    Appearance: Normal appearance. She is well-developed. She is obese. She is not ill-appearing, toxic-appearing or diaphoretic.  HENT:     Head: Normocephalic and atraumatic.     Nose: No congestion or rhinorrhea.  Eyes:     General: No scleral icterus. Cardiovascular:     Rate and Rhythm: Normal rate and regular rhythm.     Heart sounds: Normal heart sounds.  Pulmonary:     Effort: Pulmonary effort is normal. No respiratory distress.     Breath sounds: Normal breath  sounds.  Abdominal:     General: Bowel sounds are normal.  Palpations: Abdomen is soft. There is no hepatomegaly, splenomegaly or mass.     Tenderness: There is no abdominal tenderness. There is no guarding or rebound.     Hernia: No hernia is present.  Skin:    General: Skin is warm and dry.     Coloration: Skin is not jaundiced.     Findings: No rash.  Neurological:     General: No focal deficit present.     Mental Status: She is alert and oriented to person, place, and time.  Psychiatric:        Attention and Perception: Attention normal.        Mood and Affect: Mood normal.        Speech: Speech normal.        Behavior: Behavior normal.        Thought Content: Thought content normal.        Cognition and Memory: Cognition and memory normal.      Assessment:  Very pleasant 54 year old female with a history of GERD previously done well on Protonix twice daily.  She has had some worsening and recurrent symptoms over the past month.  She feels Protonix is no longer working.  Symptoms are generally random, worse during the day.  No identified triggers, she does avoid trigger substances improved.  She is not on any NSAIDs.  I do not think she has any ulcerations at this time due to the lack of severity of abdominal pain.  I will trial her on Dexilant to see if this is any more efficacious.  She does have a history of moderate internal hemorrhoids and is having rectal burning with bowel movements, typically worse when she has hard stools "like little balls" with straining.  She does not strain as much as she used to.  She is previously had internal hemorrhoids banded and external hemorrhoids excised.  I feel she is likely having recurrent hemorrhoids given her straining and worsening constipation.  Linzess causes diarrhea.  She is not on any other medications for constipation other than MiraLAX as needed.  I will have her start Colace stool softener daily, MiraLAX once a day that she can  increase to twice a day for hard stool/constipation.   Plan: 1. Stop Protonix 2. Start Dexilant 60 mg daily.  Provide samples for 1 to 2 weeks, progress report in 1 to 2 weeks 3. Stop Linzess 4. Start taking Colace 100 mg daily 5. Start taking MiraLAX once a day.  Increase MiraLAX to twice a day as needed for hard stools and straining 6. Call with any worsening symptoms 7. Otherwise follow-up in 2 months.      12/01/2019 9:15 AM   Disclaimer: This note was dictated with voice recognition software. Similar sounding words can inadvertently be transcribed and may not be corrected upon review.

## 2019-12-01 NOTE — Patient Instructions (Addendum)
Your health issues we discussed today were:   Constipation: 1. Stop taking Linzess 2. Start taking Colace over-the-counter stool softener 100 mg once a day 3. Start taking MiraLAX once a day.  You can increase this to twice a day as needed for any hard stools ("small balls of stool") or straining.  You can reduce the MiraLAX to every other day if it is too much with the stool softener 4. Call for any worsening or severe symptoms  Hemorrhoids: 1. I sent a prescription to your pharmacy for Anusol rectal cream 2. You can apply this 1-2 times a day as needed for rectal burning after bowel movements, especially with hard stools and straining 3. Call us if you have any worsening or severe symptoms. 4. Call us if you have any obvious bleeding  GERD (reflux/heartburn): 1. Stop taking Protonix 2. I am giving you samples of Dexilant for 1 to 2 weeks.  Call us in 1 to 2 weeks and let us know if it is helping your reflux symptoms 3. Call us for any worsening or severe symptoms   Overall I recommend:  1. Continue your other current medications 2. Return for follow-up in 2 months 3. Call us if you have any questions or concerns   At [facility] we value your feedback. You may receive a survey about your visit today. Please share your experience as we strive to create trusting relationships with our patients to provide genuine, compassionate, quality care.  We appreciate your understanding and patience as we review any laboratory studies, imaging, and other diagnostic tests that are ordered as we care for you. Our office policy is 5 business days for review of these results, and any emergent or urgent results are addressed in a timely manner for your best interest. If you do not hear from our office in 1 week, please contact us.   We also encourage the use of MyChart, which contains your medical information for your review as well. If you are not enrolled in this feature, an access  code is on this after visit summary for your convenience. Thank you for allowing [place] to be involved in your care.  It was great to see you today!  I hope you have a great rest of your summer!!

## 2019-12-03 ENCOUNTER — Ambulatory Visit: Payer: Medicare Other | Admitting: Orthopaedic Surgery

## 2019-12-03 ENCOUNTER — Other Ambulatory Visit: Payer: Self-pay

## 2019-12-04 DIAGNOSIS — E785 Hyperlipidemia, unspecified: Secondary | ICD-10-CM | POA: Diagnosis not present

## 2019-12-04 DIAGNOSIS — E1151 Type 2 diabetes mellitus with diabetic peripheral angiopathy without gangrene: Secondary | ICD-10-CM | POA: Diagnosis not present

## 2019-12-04 DIAGNOSIS — I10 Essential (primary) hypertension: Secondary | ICD-10-CM | POA: Diagnosis not present

## 2019-12-16 ENCOUNTER — Telehealth: Payer: Self-pay | Admitting: Internal Medicine

## 2019-12-16 DIAGNOSIS — K219 Gastro-esophageal reflux disease without esophagitis: Secondary | ICD-10-CM

## 2019-12-16 NOTE — Telephone Encounter (Signed)
Pt said the medication Walden Field, NP put her on was helping and she needs a prescription sent to [facility] to [place].

## 2019-12-16 NOTE — Telephone Encounter (Signed)
Pt with an update of Dexilant 60 mg. Samples were give 12/01/19 and are working well. Pt would like RX sent to her pharmacy.

## 2019-12-17 MED ORDER — DEXILANT 60 MG PO CPDR: 60.0000 mg | DELAYED_RELEASE_CAPSULE | Freq: Every day | ORAL | 3 refills | Status: DC

## 2019-12-17 NOTE — Telephone Encounter (Signed)
Rx sent per request. 

## 2019-12-17 NOTE — Telephone Encounter (Signed)
Noted  

## 2019-12-17 NOTE — Addendum Note (Signed)
Addended by: Gordy Levan, Dewey Viens A on: 12/17/2019 03:01 PM   Modules accepted: Orders

## 2019-12-24 ENCOUNTER — Ambulatory Visit: Payer: Self-pay

## 2019-12-24 ENCOUNTER — Ambulatory Visit (INDEPENDENT_AMBULATORY_CARE_PROVIDER_SITE_OTHER): Payer: Medicare Other | Admitting: Orthopaedic Surgery

## 2019-12-24 ENCOUNTER — Encounter: Payer: Self-pay | Admitting: Orthopaedic Surgery

## 2019-12-24 VITALS — BP 150/90 | Ht 64.0 in | Wt 182.0 lb

## 2019-12-24 DIAGNOSIS — S82832D Other fracture of upper and lower end of left fibula, subsequent encounter for closed fracture with routine healing: Secondary | ICD-10-CM

## 2019-12-24 DIAGNOSIS — G8929 Other chronic pain: Secondary | ICD-10-CM

## 2019-12-24 DIAGNOSIS — M25562 Pain in left knee: Secondary | ICD-10-CM

## 2019-12-24 NOTE — Addendum Note (Signed)
Addended by: Meyer Cory on: 12/24/2019 10:51 AM   Modules accepted: Orders

## 2019-12-24 NOTE — Progress Notes (Signed)
Office Visit Note   Patient: [name]           Date of Birth: [birth year]           MRN: [id number] Visit Date: 12/24/2019              Requested by: [name], MD [place],  [place] [id number] PCP: [name], MD   Assessment & Plan: Visit Diagnoses:  1. Chronic pain of left knee   2. Closed fracture of proximal end of left fibula with routine healing, unspecified fracture morphology, subsequent encounter     Plan: We will refer patient to physical therapy for lower extremity strengthening working on balance.  She can see what her out-of-pocket cost is with therapy and is she like to continue this at the gym on her own.  I am concerned with her history of multiple falls she is at risk for recurrent fracture problems.  She can follow-up with me as needed.  Follow-Up Instructions: No follow-ups on file.   Orders:  Orders Placed This Encounter  Procedures   XR Knee 1-2 Views Left   No orders of the defined types were placed in this encounter.     Procedures: No procedures performed   Clinical Data: No additional findings.   Subjective: Chief Complaint  Patient presents with   Left Knee - Pain    DOI 06/01/2019    HPI 54 year old female returns for follow-up of the left proximal fibular shaft fracture that occurred in March.  X-rays demonstrate interval healing.  She states she still has pain in her leg is walking problems she avoids stairs has balance problems and states she averages some around 3 sometimes more falls per year with balance problems.  She not really sure why she falls she does have diabetes.  Review of Systems all other systems update unchanged from 06/11/2019 other than as mentioned above.   Objective: Vital Signs: BP (!) 150/90    Ht 5\' 4"  (1.626 m)    Wt 182 lb (82.6 kg)    BMI 31.24 kg/m   Physical Exam  Ortho Exam  Specialty Comments:  No specialty comments available.  Imaging: XR Knee 1-2 Views  Left  Result Date: 12/24/2019 2 view x-rays left knee obtained and reviewed in clinic proximal tib-fib region.  This shows short oblique fibular fracture with interval healing and consolidation.  Tibia is intact no knee degenerative changes noted. Impression: Interval healing of left fibula proximal shaft fracture.    PMFS History: Patient Active Problem List   Diagnosis Date Noted   Hemorrhoids 12/01/2019   Fracture of left proximal fibula 06/11/2019   Abdominal pain 04/08/2019   Papanicolaou smear of cervix with positive high risk human papilloma virus (HPV) test 04/22/2018   Encounter for gynecological examination with Papanicolaou smear of cervix 04/16/2018   History of stroke 04/16/2018   Type 2 diabetes mellitus without complication, without long-term current use of insulin ([place]) 04/16/2018   Constipation 01/02/2018   Screening for colorectal cancer 04/10/2017   Encounter for well woman exam with routine gynecological exam 04/10/2017   Anxiety and depression 04/10/2017   Feeling stressed out 04/10/2017   External hemorrhoids with complication 99/37/1696   Yeast vaginitis 78/93/8101   Helicobacter pylori gastritis 07/14/2015   BV (bacterial vaginosis) 02/14/2015   Constipation by delayed colonic transit 11/11/2014   Internal hemorrhoids with complication 75/01/2584   GERD (gastroesophageal reflux disease) 01/14/2013   Past Medical History:  Diagnosis  Date   Abnormal uterine bleeding (AUB) 11/26/2012   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   Asthma    BV (bacterial vaginosis) 11/26/2012   Constipation    CVA (cerebral vascular accident) ([place]) 02/09/2018   Diabetes mellitus    Fatigue 12/24/2012   Fibroids 01/01/2013   GERD (gastroesophageal reflux disease)    Helicobacter pylori gastritis JAN 2017   EGD Bx   Hematuria 04/05/2015   Hemorrhoid 01/01/2013   Hemorrhoids 04/05/2015   Hypertension    IBS (irritable bowel syndrome)     Neuropathy due to secondary diabetes ([place])    Other and unspecified ovarian cyst 01/01/2013   Papanicolaou smear of cervix with positive high risk human papilloma virus (HPV) test 04/22/2018   Repeat in 1 year   Sarcoidosis    Stroke ([place]) 01/2018   Vaginal discharge 02/12/2013   +yeast   Vaginal itching 02/14/2015   Vaginal odor 04/05/2015    Family History  Problem Relation Age of Onset   Diabetes Father    Cancer Mother        throat   Cancer Brother        lung   Breast cancer Cousin    Cancer Maternal Aunt    Diabetes Maternal Aunt    Colon cancer Neg Hx     Past Surgical History:  Procedure Laterality Date   BACTERIAL OVERGROWTH TEST N/A 10/21/2015   Procedure: BACTERIAL OVERGROWTH TEST;  Surgeon: Danie Binder, MD;  [facility];  Service: Endoscopy;  Laterality: N/A;  0800   CHOLECYSTECTOMY     COLONOSCOPY N/A 08/20/2013   Dr. Oneida Alar: normal mucosa in terminal ileum, mild diverticulosis in ascending colon, moderate sized hemorrhoids s/p banding   ENDOMETRIAL ABLATION     ESOPHAGOGASTRODUODENOSCOPY N/A 04/13/2015   SLF: 1. FEDA due ot erosive gastriitis/ rectal bleeding    FLEXIBLE SIGMOIDOSCOPY N/A 04/13/2015   SLF: 1. Rectal bleeding due to moderate sized internal hemorrhoids   HEMORRHOID BANDING  08/20/2013   Procedure: HEMORRHOID BANDING;  Surgeon: Danie Binder, MD;  [facility];  Service: Endoscopy;;   HEMORRHOID BANDING  04/13/2015   Procedure: Thayer Jew;  Surgeon: Danie Binder, MD;  [facility];  Service: Endoscopy;;   HEMORRHOID SURGERY     banding   HEMORRHOID SURGERY N/A 12/25/2013   Procedure: EXTENSIVE HEMORRHOIDECTOMY;  Surgeon: Jamesetta So, MD;  Location: [facility];  Service: General;  Laterality: N/A;   SMART PILL PROCEDURE N/A 09/19/2015   Procedure: SMART PILL PROCEDURE;  Surgeon: Danie Binder, MD;  [facility];  Service: Endoscopy;  Laterality: N/A;  0800   TUBAL LIGATION      Social History   Occupational History    Employer: UNEMPLOYED  Tobacco Use   Smoking status: Former Smoker    Packs/day: 1.00    Years: 6.00    Pack years: 6.00    Types: Cigarettes    Quit date: 12/22/2003    Years since quitting: 16.0   Smokeless tobacco: Never Used   Tobacco comment: Quit  5 years  Vaping Use   Vaping Use: Never used  Substance and Sexual Activity   Alcohol use: Yes    Comment: "once in a while"   Drug use: No   Sexual activity: Not Currently    Birth control/protection: Surgical    Comment: tubal and ablation

## 2020-01-06 DIAGNOSIS — I10 Essential (primary) hypertension: Secondary | ICD-10-CM | POA: Diagnosis not present

## 2020-01-06 DIAGNOSIS — E1151 Type 2 diabetes mellitus with diabetic peripheral angiopathy without gangrene: Secondary | ICD-10-CM | POA: Diagnosis not present

## 2020-01-06 DIAGNOSIS — E785 Hyperlipidemia, unspecified: Secondary | ICD-10-CM | POA: Diagnosis not present

## 2020-01-21 DIAGNOSIS — E785 Hyperlipidemia, unspecified: Secondary | ICD-10-CM | POA: Diagnosis not present

## 2020-01-21 DIAGNOSIS — I10 Essential (primary) hypertension: Secondary | ICD-10-CM | POA: Diagnosis not present

## 2020-01-21 DIAGNOSIS — Z8673 Personal history of transient ischemic attack (TIA), and cerebral infarction without residual deficits: Secondary | ICD-10-CM | POA: Diagnosis not present

## 2020-01-21 DIAGNOSIS — J069 Acute upper respiratory infection, unspecified: Secondary | ICD-10-CM | POA: Diagnosis not present

## 2020-01-21 DIAGNOSIS — E1151 Type 2 diabetes mellitus with diabetic peripheral angiopathy without gangrene: Secondary | ICD-10-CM | POA: Diagnosis not present

## 2020-02-03 DIAGNOSIS — E785 Hyperlipidemia, unspecified: Secondary | ICD-10-CM | POA: Diagnosis not present

## 2020-02-03 DIAGNOSIS — I10 Essential (primary) hypertension: Secondary | ICD-10-CM | POA: Diagnosis not present

## 2020-02-03 DIAGNOSIS — E1151 Type 2 diabetes mellitus with diabetic peripheral angiopathy without gangrene: Secondary | ICD-10-CM | POA: Diagnosis not present

## 2020-02-04 ENCOUNTER — Encounter: Payer: Self-pay | Admitting: Nurse Practitioner

## 2020-02-04 ENCOUNTER — Other Ambulatory Visit: Payer: Self-pay

## 2020-02-04 ENCOUNTER — Ambulatory Visit (INDEPENDENT_AMBULATORY_CARE_PROVIDER_SITE_OTHER): Payer: Medicare Other | Admitting: Nurse Practitioner

## 2020-02-04 VITALS — BP 145/85 | HR 108 | Temp 96.9°F | Ht 64.0 in | Wt 183.4 lb

## 2020-02-04 DIAGNOSIS — K219 Gastro-esophageal reflux disease without esophagitis: Secondary | ICD-10-CM | POA: Diagnosis not present

## 2020-02-04 DIAGNOSIS — K5901 Slow transit constipation: Secondary | ICD-10-CM | POA: Diagnosis not present

## 2020-02-04 NOTE — Progress Notes (Signed)
Referring Provider: [name], MD Primary Care Physician:  [name], MD Primary GI:  Dr. Abbey Chatters  Chief Complaint  Patient presents with  . Gastroesophageal Reflux    f/u. doing okay  . Constipation    BM's about 3 times per week    HPI:   [name] is a 53 y.o. female who presents for follow-up on GERD. The patient was last seen in our office 12/01/2019 for GERD, constipation, hemorrhoids. Noted chronic history of GERD, constipation, hemorrhoids. Previously tried and failed Prilosec switched to Protonix 40 mg twice daily. Previously constipation managed with Amitiza and MiraLAX at was not very effective, Linzess 290 mcg was too much. She was switched to low-dose Linzess 72 mcg for which she called our office and indicated it was working well and prescription was sent to her pharmacy.  At her last visit noted frequent GERD symptoms despite Protonix that began worsening a month prior without obvious lifestyle changes. Denies NSAIDs. Has classic symptoms including reflux/bitter taste, chest burning. Tries to avoid triggers. Symptoms are random and not necessarily postprandial. Daytime symptoms typically worse. Notes rectal burning after bowel movement thinks it is related to hemorrhoids with a bowel movement every other day sometimes Bristol 4, sometimes Bristol 1-2. Does have straining if she has harder stools. At that time she was not currently on anything for constipation. Admits she drinks minimal water but does eat significant fiber. She stopped Linzess because it was causing diarrhea again. Uses MiraLAX as needed. No other overt GI complaints. Recommended stop Protonix and start Dexilant 60 mg daily, stop Linzess, start Colace 100 mg daily and MiraLAX once a day recommend increase to twice a day as needed, follow-up in 2 months.  Today she states she is doing okay overall. GERD doing well on current medications. Not having overt constipation/hard stools, but has issue  with incomplete bowel movement. Eats Moderate amount of fiber. Denies abdominal pain, N/V, hematochezia, melena, fever, chills, unintentional weight loss. Denies URI or flu-like symptoms. Denies loss of sense of taste or smell. The patient has received COVID-19 vaccination(s). She did have her COVID--19 booster. Denies chest pain, dyspnea, dizziness, lightheadedness, syncope, near syncope. Denies any other upper or lower GI symptoms.  Past Medical History:  Diagnosis Date  . Abnormal uterine bleeding (AUB) 11/26/2012  . 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 Asthma   . BV (bacterial vaginosis) 11/26/2012  . Constipation   . CVA (cerebral vascular accident) ([place]) 02/09/2018  . Diabetes mellitus   . Fatigue 12/24/2012  . Fibroids 01/01/2013  . GERD (gastroesophageal reflux disease)   . Helicobacter pylori gastritis JAN 2017   EGD Bx  . Hematuria 04/05/2015  . Hemorrhoid 01/01/2013  . Hemorrhoids 04/05/2015  . Hypertension   . IBS (irritable bowel syndrome)   . Neuropathy due to secondary diabetes ([place])   . Other and unspecified ovarian cyst 01/01/2013  . Papanicolaou smear of cervix with positive high risk human papilloma virus (HPV) test 04/22/2018   Repeat in 1 year  . Sarcoidosis   . Stroke ([place]) 01/2018  . Vaginal discharge 02/12/2013   +yeast  . Vaginal itching 02/14/2015  . Vaginal odor 04/05/2015    Past Surgical History:  Procedure Laterality Date  . BACTERIAL OVERGROWTH TEST N/A 10/21/2015   Procedure: BACTERIAL OVERGROWTH TEST;  Surgeon: Danie Binder, MD;  [facility];  Service: Endoscopy;  Laterality: N/A;  0800  . CHOLECYSTECTOMY    . COLONOSCOPY N/A 08/20/2013  Dr. Oneida Alar: normal mucosa in terminal ileum, mild diverticulosis in ascending colon, moderate sized hemorrhoids s/p banding  . ENDOMETRIAL ABLATION    . ESOPHAGOGASTRODUODENOSCOPY N/A 04/13/2015   SLF: 1. FEDA due ot erosive gastriitis/ rectal bleeding   . FLEXIBLE SIGMOIDOSCOPY N/A 04/13/2015   SLF: 1.  Rectal bleeding due to moderate sized internal hemorrhoids  . HEMORRHOID BANDING  08/20/2013   Procedure: HEMORRHOID BANDING;  Surgeon: Danie Binder, MD;  [facility];  Service: Endoscopy;;  . HEMORRHOID BANDING  04/13/2015   Procedure: Thayer Jew;  Surgeon: Danie Binder, MD;  [facility];  Service: Endoscopy;;  . HEMORRHOID SURGERY     banding  . HEMORRHOID SURGERY N/A 12/25/2013   Procedure: EXTENSIVE HEMORRHOIDECTOMY;  Surgeon: Jamesetta So, MD;  Location: [facility];  Service: General;  Laterality: N/A;  . SMART PILL PROCEDURE N/A 09/19/2015   Procedure: SMART PILL PROCEDURE;  Surgeon: Danie Binder, MD;  [facility];  Service: Endoscopy;  Laterality: N/A;  0800  . TUBAL LIGATION      Current Outpatient Medications  Medication Sig Dispense Refill  . albuterol (VENTOLIN HFA) 108 (90 Base) MCG/ACT inhaler Inhale 1-2 puffs into the lungs every 6 (six) hours as needed for wheezing or shortness of breath.    Marland Kitchen amLODipine (NORVASC) 5 MG tablet Take 5 mg by mouth daily.    Marland Kitchen aspirin EC 81 MG tablet Take 81 mg by mouth daily.    Marland Kitchen atorvastatin (LIPITOR) 40 MG tablet Take 40 mg by mouth at bedtime.    . clopidogrel (PLAVIX) 75 MG tablet Take 75 mg by mouth daily.    Marland Kitchen dexlansoprazole (DEXILANT) 60 MG capsule Take 1 capsule (60 mg total) by mouth daily. 90 capsule 3  . DULoxetine (CYMBALTA) 60 MG capsule Take 60 mg by mouth daily.    . fenofibrate 160 MG tablet Take 160 mg by mouth daily.    Marland Kitchen gabapentin (NEURONTIN) 300 MG capsule Take 300 mg by mouth daily.    . hydrocortisone (ANUSOL-HC) 2.5 % rectal cream Place 1 application rectally 2 (two) times daily as needed for hemorrhoids (rectal burning). 30 g 1  . metFORMIN (GLUCOPHAGE) 1000 MG tablet Take 1,000 mg by mouth 2 (two) times daily with a meal.    . polyethylene glycol (MIRALAX) 17 g packet Take 17 g by mouth daily as needed. 14 each 0  . Tiotropium Bromide Monohydrate (SPIRIVA RESPIMAT) 2.5  MCG/ACT AERS Inhale 2 puffs into the lungs daily.    Marland Kitchen tiZANidine (ZANAFLEX) 2 MG tablet Take 2 mg by mouth daily.     No current facility-administered medications for this visit.    Allergies as of 02/04/2020 - Review Complete 02/04/2020  Allergen Reaction Noted  . Pineapple Anaphylaxis and Other (See Comments) 01/20/2016  . Shellfish allergy Anaphylaxis 12/15/2011  . Other Itching 07/14/2015  . Tramadol  06/18/2019    Family History  Problem Relation Age of Onset  . Diabetes Father   . Cancer Mother        throat  . Cancer Brother        lung  . Breast cancer Cousin   . Cancer Maternal Aunt   . Diabetes Maternal Aunt   . Colon cancer Neg Hx     Social History   Socioeconomic History  . Marital status: Single    Spouse name: Not on file  . Number of children: 2  . Years of education: Not on file  .  Highest education level: Not on file  Occupational History    Employer: UNEMPLOYED  Tobacco Use  . Smoking status: Former Smoker    Packs/day: 1.00    Years: 6.00    Pack years: 6.00    Types: Cigarettes    Quit date: 12/22/2003    Years since quitting: 16.1  . Smokeless tobacco: Never Used  . Tobacco comment: Quit  5 years  Vaping Use  . Vaping Use: Never used  Substance and Sexual Activity  . Alcohol use: Yes    Comment: "once in a while"  . Drug use: No  . Sexual activity: Not Currently    Birth control/protection: Surgical    Comment: tubal and ablation  Other Topics Concern  . Not on file  Social History Narrative  . Not on file   Social Determinants of Health   Financial Resource Strain:   . Difficulty of Paying Living Expenses: Not on file  Food Insecurity:   . Worried About Charity fundraiser in the Last Year: Not on file  . Ran Out of Food in the Last Year: Not on file  Transportation Needs:   . Lack of Transportation (Medical): Not on file  . Lack of Transportation (Non-Medical): Not on file  Physical Activity:   . Days of Exercise per  Week: Not on file  . Minutes of Exercise per Session: Not on file  Stress:   . Feeling of Stress : Not on file  Social Connections:   . Frequency of Communication with Friends and Family: Not on file  . Frequency of Social Gatherings with Friends and Family: Not on file  . Attends Religious Services: Not on file  . Active Member of Clubs or Organizations: Not on file  . Attends Archivist Meetings: Not on file  . Marital Status: Not on file    Subjective: Review of Systems  Constitutional: Negative for chills, fever, malaise/fatigue and weight loss.  HENT: Negative for congestion and sore throat.   Respiratory: Negative for cough and shortness of breath.   Cardiovascular: Negative for chest pain and palpitations.  Gastrointestinal: Positive for constipation. Negative for abdominal pain, blood in stool, diarrhea, heartburn, melena, nausea and vomiting.  Musculoskeletal: Negative for joint pain and myalgias.  Skin: Negative for rash.  Neurological: Negative for dizziness and weakness.  Endo/Heme/Allergies: Does not bruise/bleed easily.  Psychiatric/Behavioral: Negative for depression. The patient is not nervous/anxious.   All other systems reviewed and are negative.    Objective: BP (!) 145/85   Pulse (!) 108   Temp (!) 96.9 F (36.1 C)   Ht 5\' 4"  (1.626 m)   Wt 183 lb 6.4 oz (83.2 kg)   BMI 31.48 kg/m  Physical Exam Vitals and nursing note reviewed.  Constitutional:      General: She is not in acute distress.    Appearance: Normal appearance. She is well-developed. She is obese. She is not ill-appearing, toxic-appearing or diaphoretic.  HENT:     Head: Normocephalic and atraumatic.     Nose: No congestion or rhinorrhea.  Eyes:     General: No scleral icterus. Cardiovascular:     Rate and Rhythm: Normal rate and regular rhythm.     Heart sounds: Normal heart sounds.  Pulmonary:     Effort: Pulmonary effort is normal. No respiratory distress.     Breath  sounds: Normal breath sounds.  Abdominal:     General: Bowel sounds are normal.     Palpations: Abdomen is  soft. There is no hepatomegaly, splenomegaly or mass.     Tenderness: There is no abdominal tenderness. There is no guarding or rebound.     Hernia: No hernia is present.  Skin:    General: Skin is warm and dry.     Coloration: Skin is not jaundiced.     Findings: No rash.  Neurological:     General: No focal deficit present.     Mental Status: She is alert and oriented to person, place, and time.  Psychiatric:        Attention and Perception: Attention normal.        Mood and Affect: Mood normal.        Speech: Speech normal.        Behavior: Behavior normal.        Thought Content: Thought content normal.        Cognition and Memory: Cognition and memory normal.      Assessment:  Very pleasant 54 year old female presents for follow-up on GERD and constipation.  Overall she is clinically improved.  No new concerns today.  GERD: Doing significantly better on Dexilant compared to Protonix.  No breakthrough symptoms.  Recommend she continue Dexilant and call us for any worsening or severe symptoms  Constipation: She is not generally having hard stools and typical constipation, but she does have incomplete emptying and difficulty finishing a bowel movement.  She does not eat enough fiber in a day, admittedly.  I recommend she start taking a fiber supplement once a day.  If this helps then she can continue this.  If she needs more she can increase to twice a day after 2 weeks.  She can stop taking fiber if it causes any intolerable symptoms.  Otherwise continue the remainder of her bowel regimen.  Of note, Linzess caused diarrhea.   Plan: 1. Continue current medications 2. Start taking fiber once daily, can increase to twice daily if needed after 2 weeks 3. Call for any worsening or severe symptoms 4. Follow-up in 6 months    Thank you for allowing [place] to participate in the  care of Erica Shannon, DNP, AGNP-C Adult & Gerontological Nurse Practitioner [facility]   02/04/2020 10:52 AM   Disclaimer: This note was dictated with voice recognition software. Similar sounding words can inadvertently be transcribed and may not be corrected upon review.

## 2020-02-04 NOTE — Patient Instructions (Signed)
Your health issues we discussed today were:   GERD (reflux/heartburn): 1. Glad you are doing better on Dexilant (dexlansoprazole) 2. Continue to take Dexilant daily as you have been 3. Call us for any worsening or severe symptoms  Constipation: 1. I am glad your symptoms are somewhat improved 2. Because you are having difficulty having complete bowel movement and completely emptying, start taking a fiber supplement once a day. 3. There are multiple options for fiber supplements.  I would see was available in the fiber I will of the pharmacy 4. If you need to do, after 2 weeks of a daily fiber supplement, you can increase the fiber to twice a day 5. Call us for any worsening or severe symptoms 6. If you have any intolerable side effects from fiber you can stop taking it and notify our office  Overall I recommend:  1. Continue your other current medications 2. Return for follow-up in 6 months 3. Call us for any questions or concerns   ---------------------------------------------------------------  I am glad you have gotten your COVID-19 vaccination!  Even though you are fully vaccinated you should continue to follow CDC and state/local guidelines.  ---------------------------------------------------------------   At [facility] we value your feedback. You may receive a survey about your visit today. Please share your experience as we strive to create trusting relationships with our patients to provide genuine, compassionate, quality care.  We appreciate your understanding and patience as we review any laboratory studies, imaging, and other diagnostic tests that are ordered as we care for you. Our office policy is 5 business days for review of these results, and any emergent or urgent results are addressed in a timely manner for your best interest. If you do not hear from our office in 1 week, please contact us.   We also encourage the use of MyChart, which contains your  medical information for your review as well. If you are not enrolled in this feature, an access code is on this after visit summary for your convenience. Thank you for allowing [place] to be involved in your care.  It was great to see you today!  I hope you have a Happy Thanksgiving!!

## 2020-03-04 DIAGNOSIS — I10 Essential (primary) hypertension: Secondary | ICD-10-CM | POA: Diagnosis not present

## 2020-03-04 DIAGNOSIS — E1151 Type 2 diabetes mellitus with diabetic peripheral angiopathy without gangrene: Secondary | ICD-10-CM | POA: Diagnosis not present

## 2020-03-04 DIAGNOSIS — E785 Hyperlipidemia, unspecified: Secondary | ICD-10-CM | POA: Diagnosis not present

## 2020-03-22 ENCOUNTER — Other Ambulatory Visit (HOSPITAL_COMMUNITY): Payer: Self-pay | Admitting: Internal Medicine

## 2020-03-22 DIAGNOSIS — Z1231 Encounter for screening mammogram for malignant neoplasm of breast: Secondary | ICD-10-CM

## 2020-04-01 DIAGNOSIS — I10 Essential (primary) hypertension: Secondary | ICD-10-CM | POA: Diagnosis not present

## 2020-04-01 DIAGNOSIS — E785 Hyperlipidemia, unspecified: Secondary | ICD-10-CM | POA: Diagnosis not present

## 2020-04-01 DIAGNOSIS — E1151 Type 2 diabetes mellitus with diabetic peripheral angiopathy without gangrene: Secondary | ICD-10-CM | POA: Diagnosis not present

## 2020-04-01 DIAGNOSIS — Z8673 Personal history of transient ischemic attack (TIA), and cerebral infarction without residual deficits: Secondary | ICD-10-CM | POA: Diagnosis not present

## 2020-04-21 ENCOUNTER — Ambulatory Visit (HOSPITAL_COMMUNITY): Payer: Medicare Other

## 2020-05-04 ENCOUNTER — Ambulatory Visit (HOSPITAL_COMMUNITY)
Admission: RE | Admit: 2020-05-04 | Discharge: 2020-05-04 | Disposition: A | Discharge status: Home / Self Care | Payer: Medicare Other | Source: Ambulatory Visit | Attending: Internal Medicine | Admitting: Internal Medicine

## 2020-05-04 ENCOUNTER — Other Ambulatory Visit: Payer: Self-pay

## 2020-05-04 DIAGNOSIS — Z1231 Encounter for screening mammogram for malignant neoplasm of breast: Secondary | ICD-10-CM

## 2020-05-05 ENCOUNTER — Other Ambulatory Visit (HOSPITAL_COMMUNITY): Payer: Self-pay | Admitting: Internal Medicine

## 2020-05-05 DIAGNOSIS — R928 Other abnormal and inconclusive findings on diagnostic imaging of breast: Secondary | ICD-10-CM

## 2020-05-24 ENCOUNTER — Other Ambulatory Visit (HOSPITAL_COMMUNITY): Payer: Self-pay | Admitting: Internal Medicine

## 2020-05-24 ENCOUNTER — Ambulatory Visit (HOSPITAL_COMMUNITY)
Admission: RE | Admit: 2020-05-24 | Discharge: 2020-05-24 | Disposition: A | Discharge status: Home / Self Care | Payer: Medicare Other | Source: Ambulatory Visit | Attending: Internal Medicine | Admitting: Internal Medicine

## 2020-05-24 ENCOUNTER — Other Ambulatory Visit: Payer: Self-pay

## 2020-05-24 DIAGNOSIS — R928 Other abnormal and inconclusive findings on diagnostic imaging of breast: Secondary | ICD-10-CM

## 2020-05-24 DIAGNOSIS — R922 Inconclusive mammogram: Secondary | ICD-10-CM | POA: Diagnosis not present

## 2020-05-26 DIAGNOSIS — M810 Age-related osteoporosis without current pathological fracture: Secondary | ICD-10-CM | POA: Diagnosis not present

## 2020-05-26 DIAGNOSIS — Z780 Asymptomatic menopausal state: Secondary | ICD-10-CM | POA: Diagnosis not present

## 2020-05-27 ENCOUNTER — Other Ambulatory Visit (HOSPITAL_COMMUNITY): Payer: Self-pay | Admitting: Internal Medicine

## 2020-05-27 ENCOUNTER — Ambulatory Visit (HOSPITAL_COMMUNITY)
Admission: RE | Admit: 2020-05-27 | Discharge: 2020-05-27 | Disposition: A | Discharge status: Home / Self Care | Payer: Medicare Other | Source: Ambulatory Visit | Attending: Internal Medicine | Admitting: Internal Medicine

## 2020-05-27 ENCOUNTER — Other Ambulatory Visit: Payer: Self-pay

## 2020-05-27 DIAGNOSIS — R928 Other abnormal and inconclusive findings on diagnostic imaging of breast: Secondary | ICD-10-CM | POA: Diagnosis not present

## 2020-05-27 DIAGNOSIS — N6012 Diffuse cystic mastopathy of left breast: Secondary | ICD-10-CM | POA: Diagnosis not present

## 2020-05-27 DIAGNOSIS — N6082 Other benign mammary dysplasias of left breast: Secondary | ICD-10-CM | POA: Diagnosis not present

## 2020-05-27 DIAGNOSIS — N6325 Unspecified lump in the left breast, overlapping quadrants: Secondary | ICD-10-CM | POA: Diagnosis not present

## 2020-05-27 MED ORDER — LIDOCAINE HCL (PF) 2 % IJ SOLN
INTRAMUSCULAR | Status: AC
  Filled 2020-05-27: qty 10

## 2020-05-27 MED ORDER — LIDOCAINE-EPINEPHRINE (PF) 1 %-1:200000 IJ SOLN
INTRAMUSCULAR | Status: AC
  Filled 2020-05-27: qty 30

## 2020-05-30 DIAGNOSIS — E119 Type 2 diabetes mellitus without complications: Secondary | ICD-10-CM | POA: Diagnosis not present

## 2020-05-30 DIAGNOSIS — E7849 Other hyperlipidemia: Secondary | ICD-10-CM | POA: Diagnosis not present

## 2020-05-30 DIAGNOSIS — I10 Essential (primary) hypertension: Secondary | ICD-10-CM | POA: Diagnosis not present

## 2020-05-30 LAB — SURGICAL PATHOLOGY

## 2020-06-09 DIAGNOSIS — M7551 Bursitis of right shoulder: Secondary | ICD-10-CM | POA: Diagnosis not present

## 2020-06-22 DIAGNOSIS — R0600 Dyspnea, unspecified: Secondary | ICD-10-CM | POA: Diagnosis not present

## 2020-06-22 DIAGNOSIS — J411 Mucopurulent chronic bronchitis: Secondary | ICD-10-CM | POA: Diagnosis not present

## 2020-06-22 DIAGNOSIS — J301 Allergic rhinitis due to pollen: Secondary | ICD-10-CM | POA: Diagnosis not present

## 2020-06-22 DIAGNOSIS — D869 Sarcoidosis, unspecified: Secondary | ICD-10-CM | POA: Diagnosis not present

## 2020-06-29 DIAGNOSIS — E1143 Type 2 diabetes mellitus with diabetic autonomic (poly)neuropathy: Secondary | ICD-10-CM | POA: Diagnosis not present

## 2020-06-29 DIAGNOSIS — I10 Essential (primary) hypertension: Secondary | ICD-10-CM | POA: Diagnosis not present

## 2020-07-06 DIAGNOSIS — Z8673 Personal history of transient ischemic attack (TIA), and cerebral infarction without residual deficits: Secondary | ICD-10-CM | POA: Diagnosis not present

## 2020-07-06 DIAGNOSIS — E1143 Type 2 diabetes mellitus with diabetic autonomic (poly)neuropathy: Secondary | ICD-10-CM | POA: Diagnosis not present

## 2020-07-06 DIAGNOSIS — Z0000 Encounter for general adult medical examination without abnormal findings: Secondary | ICD-10-CM | POA: Diagnosis not present

## 2020-07-06 DIAGNOSIS — I10 Essential (primary) hypertension: Secondary | ICD-10-CM | POA: Diagnosis not present

## 2020-07-11 ENCOUNTER — Encounter: Payer: Self-pay | Admitting: Internal Medicine

## 2020-08-09 ENCOUNTER — Ambulatory Visit: Payer: Medicare Other | Admitting: Nurse Practitioner

## 2020-08-16 ENCOUNTER — Other Ambulatory Visit: Payer: Self-pay

## 2020-08-16 ENCOUNTER — Ambulatory Visit (INDEPENDENT_AMBULATORY_CARE_PROVIDER_SITE_OTHER): Payer: Medicare Other | Admitting: Obstetrics & Gynecology

## 2020-08-16 ENCOUNTER — Encounter: Payer: Self-pay | Admitting: Obstetrics & Gynecology

## 2020-08-16 VITALS — BP 156/88 | HR 100 | Ht 66.0 in | Wt 178.0 lb

## 2020-08-16 DIAGNOSIS — B3731 Acute candidiasis of vulva and vagina: Secondary | ICD-10-CM

## 2020-08-16 DIAGNOSIS — B373 Candidiasis of vulva and vagina: Secondary | ICD-10-CM | POA: Diagnosis not present

## 2020-08-16 MED ORDER — TERCONAZOLE 0.4 % VA CREA: 1.0000 | TOPICAL_CREAM | Freq: Every day | VAGINAL | 2 refills | Status: DC

## 2020-08-16 MED ORDER — FLUCONAZOLE 100 MG PO TABS: 100.0000 mg | ORAL_TABLET | Freq: Every day | ORAL | 0 refills | Status: DC

## 2020-08-16 NOTE — Progress Notes (Signed)
Chief Complaint  Patient presents with  . Vaginal Itching      55 y.o. [id number] No LMP recorded. Patient has had an ablation. The current method of family planning is post menopausal status.  Outpatient Encounter Medications as of 08/16/2020  Medication Sig  . albuterol (VENTOLIN HFA) 108 (90 Base) MCG/ACT inhaler Inhale 1-2 puffs into the lungs every 6 (six) hours as needed for wheezing or shortness of breath.  Marland Kitchen amLODipine (NORVASC) 5 MG tablet Take 5 mg by mouth daily.  Marland Kitchen aspirin EC 81 MG tablet Take 81 mg by mouth daily.  Marland Kitchen atorvastatin (LIPITOR) 40 MG tablet Take 40 mg by mouth at bedtime.  . clopidogrel (PLAVIX) 75 MG tablet Take 75 mg by mouth daily.  Marland Kitchen dexlansoprazole (DEXILANT) 60 MG capsule Take 1 capsule (60 mg total) by mouth daily.  . DULoxetine (CYMBALTA) 60 MG capsule Take 60 mg by mouth daily.  Marland Kitchen FARXIGA 5 MG TABS tablet Take 5 mg by mouth daily.  . fenofibrate 160 MG tablet Take 160 mg by mouth daily.  . fluconazole (DIFLUCAN) 100 MG tablet Take 1 tablet (100 mg total) by mouth daily.  Marland Kitchen gabapentin (NEURONTIN) 300 MG capsule Take 300 mg by mouth daily.  . hydrocortisone (ANUSOL-HC) 2.5 % rectal cream Place 1 application rectally 2 (two) times daily as needed for hemorrhoids (rectal burning).  Marland Kitchen levocetirizine (XYZAL) 5 MG tablet Take 5 mg by mouth daily.  . metFORMIN (GLUCOPHAGE) 1000 MG tablet Take 1,000 mg by mouth 2 (two) times daily with a meal.  . polyethylene glycol (MIRALAX) 17 g packet Take 17 g by mouth daily as needed.  Marland Kitchen terconazole (TERAZOL 7) 0.4 % vaginal cream Place 1 applicator vaginally at bedtime.  . Tiotropium Bromide Monohydrate (SPIRIVA RESPIMAT) 2.5 MCG/ACT AERS Inhale 2 puffs into the lungs daily.  Marland Kitchen tiZANidine (ZANAFLEX) 2 MG tablet Take 2 mg by mouth daily.  . traZODone (DESYREL) 50 MG tablet TAKE 1/2 (ONE-HALF) TABLET BY MOUTH EVERY DAY AT BEDTIME  . TRELEGY ELLIPTA 100-62.5-25 MCG/INH AEPB SMARTSIG:1 Puff(s) Via Inhaler Every Morning    No facility-administered encounter medications on file as of 08/16/2020.    Subjective Pt with 1 week of itching externally Around the clitoris and hood Using alcohol topically No recent antibiotics Diabetic most recent A1C 6.2 2 years ago No recent STI sympoms No discharge  No odor Past Medical History:  Diagnosis Date  . Abnormal uterine bleeding (AUB) 11/26/2012  . 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 Asthma   . BV (bacterial vaginosis) 11/26/2012  . Constipation   . CVA (cerebral vascular accident) ([place]) 02/09/2018  . Diabetes mellitus   . Fatigue 12/24/2012  . Fibroids 01/01/2013  . GERD (gastroesophageal reflux disease)   . Helicobacter pylori gastritis JAN 2017   EGD Bx  . Hematuria 04/05/2015  . Hemorrhoid 01/01/2013  . Hemorrhoids 04/05/2015  . Hypertension   . IBS (irritable bowel syndrome)   . Neuropathy due to secondary diabetes ([place])   . Other and unspecified ovarian cyst 01/01/2013  . Papanicolaou smear of cervix with positive high risk human papilloma virus (HPV) test 04/22/2018   Repeat in 1 year  . Sarcoidosis   . Stroke ([place]) 01/2018  . Vaginal discharge 02/12/2013   +yeast  . Vaginal itching 02/14/2015  . Vaginal odor 04/05/2015    Past Surgical History:  Procedure Laterality Date  . BACTERIAL OVERGROWTH TEST N/A 10/21/2015   Procedure: BACTERIAL OVERGROWTH TEST;  Surgeon: Danie Binder,  MD;  [facility];  Service: Endoscopy;  Laterality: N/A;  0800  . CHOLECYSTECTOMY    . COLONOSCOPY N/A 08/20/2013   Dr. Oneida Alar: normal mucosa in terminal ileum, mild diverticulosis in ascending colon, moderate sized hemorrhoids s/p banding  . ENDOMETRIAL ABLATION    . ESOPHAGOGASTRODUODENOSCOPY N/A 04/13/2015   SLF: 1. FEDA due ot erosive gastriitis/ rectal bleeding   . FLEXIBLE SIGMOIDOSCOPY N/A 04/13/2015   SLF: 1. Rectal bleeding due to moderate sized internal hemorrhoids  . HEMORRHOID BANDING  08/20/2013   Procedure: HEMORRHOID BANDING;  Surgeon:  Danie Binder, MD;  [facility];  Service: Endoscopy;;  . HEMORRHOID BANDING  04/13/2015   Procedure: Thayer Jew;  Surgeon: Danie Binder, MD;  [facility];  Service: Endoscopy;;  . HEMORRHOID SURGERY     banding  . HEMORRHOID SURGERY N/A 12/25/2013   Procedure: EXTENSIVE HEMORRHOIDECTOMY;  Surgeon: Jamesetta So, MD;  Location: [facility];  Service: General;  Laterality: N/A;  . SMART PILL PROCEDURE N/A 09/19/2015   Procedure: SMART PILL PROCEDURE;  Surgeon: Danie Binder, MD;  [facility];  Service: Endoscopy;  Laterality: N/A;  0800  . TUBAL LIGATION      OB History    Gravida  6   Para  2   Term      Preterm      AB  4   Living  2     SAB  2   IAB      Ectopic      Multiple      Live Births  2           Allergies  Allergen Reactions  . Pineapple Anaphylaxis and Other (See Comments)    Mouth itches and burns.   . Shellfish Allergy Anaphylaxis  . Other Itching    pinapple  . Tramadol     itching    Social History   Socioeconomic History  . Marital status: Single    Spouse name: Not on file  . Number of children: 2  . Years of education: Not on file  . Highest education level: Not on file  Occupational History    Employer: UNEMPLOYED  Tobacco Use  . Smoking status: Former Smoker    Packs/day: 1.00    Years: 6.00    Pack years: 6.00    Types: Cigarettes    Quit date: 12/22/2003    Years since quitting: 16.6  . Smokeless tobacco: Never Used  . Tobacco comment: Quit  5 years  Vaping Use  . Vaping Use: Never used  Substance and Sexual Activity  . Alcohol use: Yes    Comment: "once in a while"  . Drug use: No  . Sexual activity: Not Currently    Birth control/protection: Surgical    Comment: tubal and ablation  Other Topics Concern  . Not on file  Social History Narrative  . Not on file   Social Determinants of Health   Financial Resource Strain: Not on file  Food Insecurity: Not on file   Transportation Needs: Not on file  Physical Activity: Not on file  Stress: Not on file  Social Connections: Not on file    Family History  Problem Relation Age of Onset  . Diabetes Father   . Cancer Mother        throat  . Cancer Brother        lung  . Breast cancer Cousin   . Cancer  Maternal Aunt   . Diabetes Maternal Aunt   . Colon cancer Neg Hx     Medications:       Current Outpatient Medications:  .  albuterol (VENTOLIN HFA) 108 (90 Base) MCG/ACT inhaler, Inhale 1-2 puffs into the lungs every 6 (six) hours as needed for wheezing or shortness of breath., Disp: , Rfl:  .  amLODipine (NORVASC) 5 MG tablet, Take 5 mg by mouth daily., Disp: , Rfl:  .  aspirin EC 81 MG tablet, Take 81 mg by mouth daily., Disp: , Rfl:  .  atorvastatin (LIPITOR) 40 MG tablet, Take 40 mg by mouth at bedtime., Disp: , Rfl:  .  clopidogrel (PLAVIX) 75 MG tablet, Take 75 mg by mouth daily., Disp: , Rfl:  .  dexlansoprazole (DEXILANT) 60 MG capsule, Take 1 capsule (60 mg total) by mouth daily., Disp: 90 capsule, Rfl: 3 .  DULoxetine (CYMBALTA) 60 MG capsule, Take 60 mg by mouth daily., Disp: , Rfl:  .  FARXIGA 5 MG TABS tablet, Take 5 mg by mouth daily., Disp: , Rfl:  .  fenofibrate 160 MG tablet, Take 160 mg by mouth daily., Disp: , Rfl:  .  fluconazole (DIFLUCAN) 100 MG tablet, Take 1 tablet (100 mg total) by mouth daily., Disp: 7 tablet, Rfl: 0 .  gabapentin (NEURONTIN) 300 MG capsule, Take 300 mg by mouth daily., Disp: , Rfl:  .  hydrocortisone (ANUSOL-HC) 2.5 % rectal cream, Place 1 application rectally 2 (two) times daily as needed for hemorrhoids (rectal burning)., Disp: 30 g, Rfl: 1 .  levocetirizine (XYZAL) 5 MG tablet, Take 5 mg by mouth daily., Disp: , Rfl:  .  metFORMIN (GLUCOPHAGE) 1000 MG tablet, Take 1,000 mg by mouth 2 (two) times daily with a meal., Disp: , Rfl:  .  polyethylene glycol (MIRALAX) 17 g packet, Take 17 g by mouth daily as needed., Disp: 14 each, Rfl: 0 .  terconazole  (TERAZOL 7) 0.4 % vaginal cream, Place 1 applicator vaginally at bedtime., Disp: 45 g, Rfl: 2 .  Tiotropium Bromide Monohydrate (SPIRIVA RESPIMAT) 2.5 MCG/ACT AERS, Inhale 2 puffs into the lungs daily., Disp: , Rfl:  .  tiZANidine (ZANAFLEX) 2 MG tablet, Take 2 mg by mouth daily., Disp: , Rfl:  .  traZODone (DESYREL) 50 MG tablet, TAKE 1/2 (ONE-HALF) TABLET BY MOUTH EVERY DAY AT BEDTIME, Disp: , Rfl:  .  TRELEGY ELLIPTA 100-62.5-25 MCG/INH AEPB, SMARTSIG:1 Puff(s) Via Inhaler Every Morning, Disp: , Rfl:   Objective Blood pressure (!) 156/88, pulse 100, height 5\' 6"  (1.676 m), weight 178 lb (80.7 kg).  General WDWN female NAD Vulva:  normal appearing vulva with no masses, tenderness or lesions, shiny skin around the clitoris consistent with candida Vagina:  normal mucosa, no discharge Painted perineum vagina vulva with gentian violet    Pertinent ROS No burning with urination, frequency or urgency No nausea, vomiting or diarrhea Nor fever chills or other constitutional symptoms   Labs or studies     Impression Diagnoses this Encounter::   ICD-10-CM   1. Candidal vulvovaginitis  B37.3     Established relevant diagnosis(es):   Plan/Recommendations: Meds ordered this encounter  Medications  . fluconazole (DIFLUCAN) 100 MG tablet    Sig: Take 1 tablet (100 mg total) by mouth daily.    Dispense:  7 tablet    Refill:  0  . terconazole (TERAZOL 7) 0.4 % vaginal cream    Sig: Place 1 applicator vaginally at bedtime.    Dispense:  45 g    Refill:  2    Labs or Scans Ordered: No orders of the defined types were placed in this encounter.   Management:: Topical gentian violet used Use terazol topically as well Diflucan for 1 week  Follow up Return if symptoms worsen or fail to improve.        All questions were answered.

## 2020-08-19 DIAGNOSIS — R202 Paresthesia of skin: Secondary | ICD-10-CM | POA: Diagnosis not present

## 2020-08-20 NOTE — Progress Notes (Signed)
Patient was a no show to her telephone visit.

## 2020-08-22 ENCOUNTER — Ambulatory Visit (INDEPENDENT_AMBULATORY_CARE_PROVIDER_SITE_OTHER): Payer: Medicare Other | Admitting: Gastroenterology

## 2020-08-22 ENCOUNTER — Telehealth: Payer: Self-pay | Admitting: *Deleted

## 2020-08-22 DIAGNOSIS — K5901 Slow transit constipation: Secondary | ICD-10-CM

## 2020-08-22 DIAGNOSIS — K219 Gastro-esophageal reflux disease without esophagitis: Secondary | ICD-10-CM

## 2020-08-22 NOTE — Telephone Encounter (Signed)
Pt consented to a telephone visit. °

## 2020-08-22 NOTE — Telephone Encounter (Signed)
[name], you are scheduled for a virtual visit with your provider today.  Just as we do with appointments in the office, we must obtain your consent to participate.  Your consent will be active for this visit and any virtual visit you may have with one of our providers in the next 365 days.  If you have a MyChart account, I can also send a copy of this consent to you electronically.  All virtual visits are billed to your insurance company just like a traditional visit in the office.  As this is a virtual visit, video technology does not allow for your provider to perform a traditional examination.  This may limit your provider's ability to fully assess your condition.  If your provider identifies any concerns that need to be evaluated in person or the need to arrange testing such as labs, EKG, etc, we will make arrangements to do so.  Although advances in technology are sophisticated, we cannot ensure that it will always work on either your end or our end.  If the connection with a video visit is poor, we may have to switch to a telephone visit.  With either a video or telephone visit, we are not always able to ensure that we have a secure connection.   I need to obtain your verbal consent now.   Are you willing to proceed with your visit today?

## 2020-09-29 DIAGNOSIS — E1143 Type 2 diabetes mellitus with diabetic autonomic (poly)neuropathy: Secondary | ICD-10-CM | POA: Diagnosis not present

## 2020-09-29 DIAGNOSIS — I10 Essential (primary) hypertension: Secondary | ICD-10-CM | POA: Diagnosis not present

## 2020-10-12 DIAGNOSIS — Z8673 Personal history of transient ischemic attack (TIA), and cerebral infarction without residual deficits: Secondary | ICD-10-CM | POA: Diagnosis not present

## 2020-10-12 DIAGNOSIS — E1143 Type 2 diabetes mellitus with diabetic autonomic (poly)neuropathy: Secondary | ICD-10-CM | POA: Diagnosis not present

## 2020-10-12 DIAGNOSIS — Z0000 Encounter for general adult medical examination without abnormal findings: Secondary | ICD-10-CM | POA: Diagnosis not present

## 2020-10-12 DIAGNOSIS — E785 Hyperlipidemia, unspecified: Secondary | ICD-10-CM | POA: Diagnosis not present

## 2020-10-12 DIAGNOSIS — I10 Essential (primary) hypertension: Secondary | ICD-10-CM | POA: Diagnosis not present

## 2020-10-30 DIAGNOSIS — E1143 Type 2 diabetes mellitus with diabetic autonomic (poly)neuropathy: Secondary | ICD-10-CM | POA: Diagnosis not present

## 2020-10-30 DIAGNOSIS — I10 Essential (primary) hypertension: Secondary | ICD-10-CM | POA: Diagnosis not present

## 2020-11-08 ENCOUNTER — Other Ambulatory Visit: Payer: Self-pay

## 2020-11-08 DIAGNOSIS — K219 Gastro-esophageal reflux disease without esophagitis: Secondary | ICD-10-CM

## 2020-11-08 MED ORDER — DEXLANSOPRAZOLE 60 MG PO CPDR: 60.0000 mg | DELAYED_RELEASE_CAPSULE | Freq: Every day | ORAL | 5 refills | Status: DC

## 2020-11-18 DIAGNOSIS — Z20822 Contact with and (suspected) exposure to covid-19: Secondary | ICD-10-CM | POA: Diagnosis not present

## 2020-11-27 DIAGNOSIS — E1169 Type 2 diabetes mellitus with other specified complication: Secondary | ICD-10-CM | POA: Diagnosis not present

## 2020-11-27 DIAGNOSIS — I10 Essential (primary) hypertension: Secondary | ICD-10-CM | POA: Diagnosis not present

## 2020-11-28 DIAGNOSIS — I10 Essential (primary) hypertension: Secondary | ICD-10-CM | POA: Diagnosis not present

## 2020-11-28 DIAGNOSIS — E1143 Type 2 diabetes mellitus with diabetic autonomic (poly)neuropathy: Secondary | ICD-10-CM | POA: Diagnosis not present

## 2020-11-29 DIAGNOSIS — E1143 Type 2 diabetes mellitus with diabetic autonomic (poly)neuropathy: Secondary | ICD-10-CM | POA: Diagnosis not present

## 2020-11-29 DIAGNOSIS — I10 Essential (primary) hypertension: Secondary | ICD-10-CM | POA: Diagnosis not present

## 2020-11-30 DIAGNOSIS — E1143 Type 2 diabetes mellitus with diabetic autonomic (poly)neuropathy: Secondary | ICD-10-CM | POA: Diagnosis not present

## 2020-11-30 DIAGNOSIS — I10 Essential (primary) hypertension: Secondary | ICD-10-CM | POA: Diagnosis not present

## 2020-11-30 DIAGNOSIS — M79645 Pain in left finger(s): Secondary | ICD-10-CM | POA: Diagnosis not present

## 2020-11-30 DIAGNOSIS — Z8673 Personal history of transient ischemic attack (TIA), and cerebral infarction without residual deficits: Secondary | ICD-10-CM | POA: Diagnosis not present

## 2020-12-26 DIAGNOSIS — J40 Bronchitis, not specified as acute or chronic: Secondary | ICD-10-CM | POA: Diagnosis not present

## 2020-12-29 DIAGNOSIS — K659 Peritonitis, unspecified: Secondary | ICD-10-CM | POA: Diagnosis not present

## 2020-12-29 DIAGNOSIS — K6389 Other specified diseases of intestine: Secondary | ICD-10-CM | POA: Diagnosis not present

## 2020-12-29 DIAGNOSIS — Z9049 Acquired absence of other specified parts of digestive tract: Secondary | ICD-10-CM | POA: Diagnosis not present

## 2020-12-29 DIAGNOSIS — I700 Atherosclerosis of aorta: Secondary | ICD-10-CM | POA: Diagnosis not present

## 2020-12-29 DIAGNOSIS — K5730 Diverticulosis of large intestine without perforation or abscess without bleeding: Secondary | ICD-10-CM | POA: Diagnosis not present

## 2020-12-29 DIAGNOSIS — R1032 Left lower quadrant pain: Secondary | ICD-10-CM | POA: Diagnosis not present

## 2021-01-02 DIAGNOSIS — K659 Peritonitis, unspecified: Secondary | ICD-10-CM | POA: Diagnosis not present

## 2021-01-02 DIAGNOSIS — J40 Bronchitis, not specified as acute or chronic: Secondary | ICD-10-CM | POA: Diagnosis not present

## 2021-01-11 DIAGNOSIS — Z23 Encounter for immunization: Secondary | ICD-10-CM | POA: Diagnosis not present

## 2021-01-11 DIAGNOSIS — D869 Sarcoidosis, unspecified: Secondary | ICD-10-CM | POA: Diagnosis not present

## 2021-01-11 DIAGNOSIS — Z122 Encounter for screening for malignant neoplasm of respiratory organs: Secondary | ICD-10-CM | POA: Diagnosis not present

## 2021-01-11 DIAGNOSIS — J302 Other seasonal allergic rhinitis: Secondary | ICD-10-CM | POA: Diagnosis not present

## 2021-01-11 DIAGNOSIS — J449 Chronic obstructive pulmonary disease, unspecified: Secondary | ICD-10-CM | POA: Diagnosis not present

## 2021-01-11 DIAGNOSIS — Z87891 Personal history of nicotine dependence: Secondary | ICD-10-CM | POA: Diagnosis not present

## 2021-01-11 DIAGNOSIS — R0600 Dyspnea, unspecified: Secondary | ICD-10-CM | POA: Diagnosis not present

## 2021-01-30 DIAGNOSIS — I10 Essential (primary) hypertension: Secondary | ICD-10-CM | POA: Diagnosis not present

## 2021-01-30 DIAGNOSIS — E1143 Type 2 diabetes mellitus with diabetic autonomic (poly)neuropathy: Secondary | ICD-10-CM | POA: Diagnosis not present

## 2021-02-15 DIAGNOSIS — I10 Essential (primary) hypertension: Secondary | ICD-10-CM | POA: Diagnosis not present

## 2021-02-15 DIAGNOSIS — E1169 Type 2 diabetes mellitus with other specified complication: Secondary | ICD-10-CM | POA: Diagnosis not present

## 2021-02-15 DIAGNOSIS — Z8673 Personal history of transient ischemic attack (TIA), and cerebral infarction without residual deficits: Secondary | ICD-10-CM | POA: Diagnosis not present

## 2021-02-15 DIAGNOSIS — Z0000 Encounter for general adult medical examination without abnormal findings: Secondary | ICD-10-CM | POA: Diagnosis not present

## 2021-03-31 DIAGNOSIS — E1169 Type 2 diabetes mellitus with other specified complication: Secondary | ICD-10-CM | POA: Diagnosis not present

## 2021-03-31 DIAGNOSIS — I10 Essential (primary) hypertension: Secondary | ICD-10-CM | POA: Diagnosis not present

## 2021-04-17 ENCOUNTER — Other Ambulatory Visit: Payer: Self-pay | Admitting: Gastroenterology

## 2021-04-17 DIAGNOSIS — K219 Gastro-esophageal reflux disease without esophagitis: Secondary | ICD-10-CM

## 2021-05-24 DIAGNOSIS — E1169 Type 2 diabetes mellitus with other specified complication: Secondary | ICD-10-CM | POA: Diagnosis not present

## 2021-05-24 DIAGNOSIS — I10 Essential (primary) hypertension: Secondary | ICD-10-CM | POA: Diagnosis not present

## 2021-05-24 DIAGNOSIS — Z8673 Personal history of transient ischemic attack (TIA), and cerebral infarction without residual deficits: Secondary | ICD-10-CM | POA: Diagnosis not present

## 2021-05-24 DIAGNOSIS — Z0000 Encounter for general adult medical examination without abnormal findings: Secondary | ICD-10-CM | POA: Diagnosis not present

## 2021-05-30 DIAGNOSIS — E1169 Type 2 diabetes mellitus with other specified complication: Secondary | ICD-10-CM | POA: Diagnosis not present

## 2021-05-30 DIAGNOSIS — I10 Essential (primary) hypertension: Secondary | ICD-10-CM | POA: Diagnosis not present

## 2021-06-16 DIAGNOSIS — Z122 Encounter for screening for malignant neoplasm of respiratory organs: Secondary | ICD-10-CM | POA: Diagnosis not present

## 2021-06-16 DIAGNOSIS — Z87891 Personal history of nicotine dependence: Secondary | ICD-10-CM | POA: Diagnosis not present

## 2021-06-29 DIAGNOSIS — I209 Angina pectoris, unspecified: Secondary | ICD-10-CM | POA: Diagnosis not present

## 2021-06-29 DIAGNOSIS — J449 Chronic obstructive pulmonary disease, unspecified: Secondary | ICD-10-CM | POA: Diagnosis not present

## 2021-06-29 DIAGNOSIS — Z122 Encounter for screening for malignant neoplasm of respiratory organs: Secondary | ICD-10-CM | POA: Diagnosis not present

## 2021-06-29 DIAGNOSIS — D869 Sarcoidosis, unspecified: Secondary | ICD-10-CM | POA: Diagnosis not present

## 2021-06-29 DIAGNOSIS — R0600 Dyspnea, unspecified: Secondary | ICD-10-CM | POA: Diagnosis not present

## 2021-07-03 ENCOUNTER — Telehealth: Payer: Self-pay

## 2021-07-03 NOTE — Telephone Encounter (Signed)
NOTES SCANNED TO REFERRAL 

## 2021-07-28 DIAGNOSIS — E1169 Type 2 diabetes mellitus with other specified complication: Secondary | ICD-10-CM | POA: Diagnosis not present

## 2021-07-28 DIAGNOSIS — I10 Essential (primary) hypertension: Secondary | ICD-10-CM | POA: Diagnosis not present

## 2021-07-28 DIAGNOSIS — E785 Hyperlipidemia, unspecified: Secondary | ICD-10-CM | POA: Diagnosis not present

## 2021-08-07 ENCOUNTER — Ambulatory Visit: Payer: Medicare Other | Admitting: Cardiology

## 2021-08-09 ENCOUNTER — Ambulatory Visit (INDEPENDENT_AMBULATORY_CARE_PROVIDER_SITE_OTHER): Payer: Medicare Other | Admitting: Cardiology

## 2021-08-09 ENCOUNTER — Encounter: Payer: Self-pay | Admitting: Cardiology

## 2021-08-09 VITALS — BP 142/90 | HR 104 | Ht 66.0 in | Wt 173.6 lb

## 2021-08-09 DIAGNOSIS — R079 Chest pain, unspecified: Secondary | ICD-10-CM | POA: Diagnosis not present

## 2021-08-09 DIAGNOSIS — R0609 Other forms of dyspnea: Secondary | ICD-10-CM | POA: Diagnosis not present

## 2021-08-09 MED ORDER — AMLODIPINE BESYLATE 10 MG PO TABS: 10.0000 mg | ORAL_TABLET | Freq: Every day | ORAL | 3 refills | Status: DC

## 2021-08-09 NOTE — Addendum Note (Signed)
Addended by: Christella Scheuermann C on: 08/09/2021 04:07 PM ? ? Modules accepted: Orders ? ?

## 2021-08-09 NOTE — Patient Instructions (Signed)
Medication Instructions:  ?Increase Norvasc to 10 mg tablets daily ? ?Labwork: ?None ? ?Testing/Procedures: ?Your physician has requested that you have en exercise stress myoview. For further information please visit [email or web address]. Please follow instruction sheet, as given. ? ? ?Follow-Up: ?Follow up with Dr. Harl Bowie: PENDING  ? ?Any Other Special Instructions Will Be Listed Below (If Applicable). ? ? ? ? ?If you need a refill on your cardiac medications before your next appointment, please call your pharmacy. ? ?

## 2021-08-09 NOTE — Progress Notes (Signed)
? ? ? ?Clinical Summary ?[name] is a 56 y.o.female seen as new consult, referedd by Dr Linzie Collin for the following medical problems. ? ?1.Chest pain/SOB ?- symptoms roughly 6 months ?- sharp pain midchest, 8/10 in severity. Occurs with activity. +SOB. Pains last a few seconds. Not positional. Symptoms occur about once a month ?- DOE with house work. ?- no recent edema ? ?CAD risk factors: DM2, former smoker x 10 years, HTN, HL ? ? ? ? ? ?2. COPD ?- compliant with inhalers ? ? ?3. History of CVA ? ? ?4. Sarcoidosis ? ?5,. HTN ?- compliant with meds ? ?Past Medical History:  ?Diagnosis Date  ? Abnormal uterine bleeding (AUB) 11/26/2012  ? 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 Asthma   ? BV (bacterial vaginosis) 11/26/2012  ? Constipation   ? CVA (cerebral vascular accident) ([place]) 02/09/2018  ? Diabetes mellitus   ? Fatigue 12/24/2012  ? Fibroids 01/01/2013  ? GERD (gastroesophageal reflux disease)   ? Helicobacter pylori gastritis JAN 2017  ? EGD Bx  ? Hematuria 04/05/2015  ? Hemorrhoid 01/01/2013  ? Hemorrhoids 04/05/2015  ? Hypertension   ? IBS (irritable bowel syndrome)   ? Neuropathy due to secondary diabetes ([place])   ? Other and unspecified ovarian cyst 01/01/2013  ? Papanicolaou smear of cervix with positive high risk human papilloma virus (HPV) test 04/22/2018  ? Repeat in 1 year  ? Sarcoidosis   ? Stroke [facility]) 01/2018  ? Vaginal discharge 02/12/2013  ? +yeast  ? Vaginal itching 02/14/2015  ? Vaginal odor 04/05/2015  ? ? ? ?Allergies  ?Allergen Reactions  ? Pineapple Anaphylaxis and Other (See Comments)  ?  Mouth itches and burns.   ? Shellfish Allergy Anaphylaxis  ? Other Itching  ?  pinapple  ? Tramadol   ?  itching  ? ? ? ?Current Outpatient Medications  ?Medication Sig Dispense Refill  ? albuterol (VENTOLIN HFA) 108 (90 Base) MCG/ACT inhaler Inhale 1-2 puffs into the lungs every 6 (six) hours as needed for wheezing or shortness of breath.    ? amLODipine (NORVASC) 5 MG tablet Take 5 mg by mouth daily.    ?  aspirin EC 81 MG tablet Take 81 mg by mouth daily.    ? atorvastatin (LIPITOR) 40 MG tablet Take 40 mg by mouth at bedtime.    ? clopidogrel (PLAVIX) 75 MG tablet Take 75 mg by mouth daily.    ? DEXILANT 60 MG capsule TAKE 1 CAPSULE BY MOUTH  DAILY 90 capsule 3  ? DULoxetine (CYMBALTA) 60 MG capsule Take 60 mg by mouth daily.    ? FARXIGA 5 MG TABS tablet Take 5 mg by mouth daily.    ? fenofibrate 160 MG tablet Take 160 mg by mouth daily.    ? fluconazole (DIFLUCAN) 100 MG tablet Take 1 tablet (100 mg total) by mouth daily. 7 tablet 0  ? gabapentin (NEURONTIN) 300 MG capsule Take 300 mg by mouth daily.    ? hydrocortisone (ANUSOL-HC) 2.5 % rectal cream Place 1 application rectally 2 (two) times daily as needed for hemorrhoids (rectal burning). 30 g 1  ? levocetirizine (XYZAL) 5 MG tablet Take 5 mg by mouth daily.    ? metFORMIN (GLUCOPHAGE) 1000 MG tablet Take 1,000 mg by mouth 2 (two) times daily with a meal.    ? polyethylene glycol (MIRALAX) 17 g packet Take 17 g by mouth daily as needed. 14 each 0  ? terconazole (TERAZOL 7) 0.4 % vaginal cream  Place 1 applicator vaginally at bedtime. 45 g 2  ? Tiotropium Bromide Monohydrate (SPIRIVA RESPIMAT) 2.5 MCG/ACT AERS Inhale 2 puffs into the lungs daily.    ? tiZANidine (ZANAFLEX) 2 MG tablet Take 2 mg by mouth daily.    ? traZODone (DESYREL) 50 MG tablet TAKE 1/2 (ONE-HALF) TABLET BY MOUTH EVERY DAY AT BEDTIME    ? TRELEGY ELLIPTA 100-62.5-25 MCG/INH AEPB SMARTSIG:1 Puff(s) Via Inhaler Every Morning    ? ?No current facility-administered medications for this visit.  ? ? ? ?Past Surgical History:  ?Procedure Laterality Date  ? BACTERIAL OVERGROWTH TEST N/A 10/21/2015  ? Procedure: BACTERIAL OVERGROWTH TEST;  Surgeon: Danie Binder, MD;  [facility];  Service: Endoscopy;  Laterality: N/A;  0800  ? CHOLECYSTECTOMY    ? COLONOSCOPY N/A 08/20/2013  ? Dr. Oneida Alar: normal mucosa in terminal ileum, mild diverticulosis in ascending colon, moderate sized hemorrhoids s/p  banding  ? ENDOMETRIAL ABLATION    ? ESOPHAGOGASTRODUODENOSCOPY N/A 04/13/2015  ? SLF: 1. FEDA due ot erosive gastriitis/ rectal bleeding   ? FLEXIBLE SIGMOIDOSCOPY N/A 04/13/2015  ? SLF: 1. Rectal bleeding due to moderate sized internal hemorrhoids  ? HEMORRHOID BANDING  08/20/2013  ? Procedure: HEMORRHOID BANDING;  Surgeon: Danie Binder, MD;  [facility];  Service: Endoscopy;;  ? HEMORRHOID BANDING  04/13/2015  ? Procedure: HEMORRHOID BANDING;  Surgeon: Danie Binder, MD;  [facility];  Service: Endoscopy;;  ? HEMORRHOID SURGERY    ? banding  ? HEMORRHOID SURGERY N/A 12/25/2013  ? Procedure: EXTENSIVE HEMORRHOIDECTOMY;  Surgeon: Jamesetta So, MD;  Location: [facility];  Service: General;  Laterality: N/A;  ? SMART PILL PROCEDURE N/A 09/19/2015  ? Procedure: SMART PILL PROCEDURE;  Surgeon: Danie Binder, MD;  [facility];  Service: Endoscopy;  Laterality: N/A;  0800  ? TUBAL LIGATION    ? ? ? ?Allergies  ?Allergen Reactions  ? Pineapple Anaphylaxis and Other (See Comments)  ?  Mouth itches and burns.   ? Shellfish Allergy Anaphylaxis  ? Other Itching  ?  pinapple  ? Tramadol   ?  itching  ? ? ? ? ?Family History  ?Problem Relation Age of Onset  ? Diabetes Father   ? Cancer Mother   ?     throat  ? Cancer Brother   ?     lung  ? Breast cancer Cousin   ? Cancer Maternal Aunt   ? Diabetes Maternal Aunt   ? Colon cancer Neg Hx   ? ? ? ?Social History ?[name] reports that she quit smoking about 17 years ago. Her smoking use included cigarettes. She has a 6.00 pack-year smoking history. She has never used smokeless tobacco. ?[name] reports current alcohol use. ? ? ?Review of Systems ?CONSTITUTIONAL: No weight loss, fever, chills, weakness or fatigue.  ?HEENT: Eyes: No visual loss, blurred vision, double vision or yellow sclerae.No hearing loss, sneezing, congestion, runny nose or sore throat.  ?SKIN: No rash or itching.  ?CARDIOVASCULAR: per hpi ?RESPIRATORY: per  hpi ?GASTROINTESTINAL: No anorexia, nausea, vomiting or diarrhea. No abdominal pain or blood.  ?GENITOURINARY: No burning on urination, no polyuria ?NEUROLOGICAL: No headache, dizziness, syncope, paralysis, ataxia, numbness or tingling in the extremities. No change in bowel or bladder control.  ?MUSCULOSKELETAL: No muscle, back pain, joint pain or stiffness.  ?LYMPHATICS: No enlarged nodes. No history of splenectomy.  ?PSYCHIATRIC: No history of depression or anxiety.  ?ENDOCRINOLOGIC: No reports of sweating,  cold or heat intolerance. No polyuria or polydipsia.  ?. ? ? ?Physical Examination ?Today's Vitals  ? 08/09/21 1327  ?BP: (!) 142/90  ?Pulse: (!) 104  ?SpO2: 96%  ?Weight: 173 lb 9.6 oz (78.7 kg)  ?Height: '5\' 6"'$  (1.676 m)  ? ?Body mass index is 28.02 kg/m?. ? ?Gen: resting comfortably, no acute distress ?HEENT: no scleral icterus, pupils equal round and reactive, no palptable cervical adenopathy,  ?CV: RRR, no m/r/g no jvd ?Resp: Clear to auscultation bilaterally ?GI: abdomen is soft, non-tender, non-distended, normal bowel sounds, no hepatosplenomegaly ?MSK: extremities are warm, no edema.  ?Skin: warm, no rash ?Neuro:  no focal deficits ?Psych: appropriate affect ? ? ?Diagnostic Studies ? ? ? ? ?Assessment and Plan  ?1.Chest pain/DOE ?- exertional chest pain and DOE in patient with with multiple CAD risk factors ?- plan for exercise nuclear stress test to further evaluate ? ?2. HTN ?- above goal, increase norvasc to '10mg'$  daily. ? ? ? ? ? ?Arnoldo Lenis, M.D.. ?

## 2021-08-15 ENCOUNTER — Encounter (HOSPITAL_COMMUNITY): Payer: Self-pay

## 2021-08-15 ENCOUNTER — Ambulatory Visit (HOSPITAL_COMMUNITY)
Admission: RE | Admit: 2021-08-15 | Discharge: 2021-08-15 | Disposition: A | Discharge status: Home / Self Care | Payer: Medicare Other | Source: Ambulatory Visit | Attending: Cardiology | Admitting: Cardiology

## 2021-08-15 ENCOUNTER — Encounter (HOSPITAL_COMMUNITY)
Admission: RE | Admit: 2021-08-15 | Discharge: 2021-08-15 | Disposition: A | Discharge status: Home / Self Care | Payer: Medicare Other | Source: Ambulatory Visit | Attending: Cardiology | Admitting: Cardiology

## 2021-08-15 DIAGNOSIS — R079 Chest pain, unspecified: Secondary | ICD-10-CM | POA: Diagnosis not present

## 2021-08-15 LAB — NM MYOCAR MULTI W/SPECT W/WALL MOTION / EF
Angina Index: 0
Duke Treadmill Score: 6
Estimated workload: 7
Exercise duration (min): 5 min
Exercise duration (sec): 43 s
LV dias vol: 91 mL (ref 46–106)
LV sys vol: 39 mL
MPHR: 163 {beats}/min
Nuc Stress EF: 57 %
Peak HR: 148 {beats}/min
Percent HR: 90 %
RATE: 0.4
RPE: 15
Rest HR: 60 {beats}/min
Rest Nuclear Isotope Dose: 10.6 mCi
SDS: 6
SRS: 0
SSS: 6
ST Depression (mm): 0 mm
Stress Nuclear Isotope Dose: 31 mCi
TID: 1.01

## 2021-08-15 MED ORDER — TECHNETIUM TC 99M TETROFOSMIN IV KIT
10.0000 | PACK | Freq: Once | INTRAVENOUS | Status: AC | PRN
  Administered 2021-08-15: 10.6 via INTRAVENOUS

## 2021-08-15 MED ORDER — SODIUM CHLORIDE FLUSH 0.9 % IV SOLN
INTRAVENOUS | Status: AC
  Administered 2021-08-15: 10 mL
  Filled 2021-08-15: qty 10

## 2021-08-15 MED ORDER — REGADENOSON 0.4 MG/5ML IV SOLN
INTRAVENOUS | Status: AC
  Filled 2021-08-15: qty 5

## 2021-08-15 MED ORDER — TECHNETIUM TC 99M TETROFOSMIN IV KIT
30.0000 | PACK | Freq: Once | INTRAVENOUS | Status: AC | PRN
  Administered 2021-08-15: 31 via INTRAVENOUS

## 2021-08-18 ENCOUNTER — Telehealth: Payer: Self-pay

## 2021-08-18 NOTE — Telephone Encounter (Signed)
Pt notified and verbalized understanding. Pt will f/u with PCP. Pt scheduled for 48mf/u w/provider.

## 2021-08-18 NOTE — Telephone Encounter (Signed)
-----   Message from Arnoldo Lenis, MD sent at 08/17/2021  4:31 PM EDT ----- Normal stress test, no evidence of any blockages as the cause of her symptoms. Needs to f/u with pcp to consider noncardiac causes, can f/u with me or PA 3 months    Zandra Abts MD

## 2021-08-23 DIAGNOSIS — I10 Essential (primary) hypertension: Secondary | ICD-10-CM | POA: Diagnosis not present

## 2021-08-23 DIAGNOSIS — Z0000 Encounter for general adult medical examination without abnormal findings: Secondary | ICD-10-CM | POA: Diagnosis not present

## 2021-08-23 DIAGNOSIS — Z8673 Personal history of transient ischemic attack (TIA), and cerebral infarction without residual deficits: Secondary | ICD-10-CM | POA: Diagnosis not present

## 2021-08-23 DIAGNOSIS — E1165 Type 2 diabetes mellitus with hyperglycemia: Secondary | ICD-10-CM | POA: Diagnosis not present

## 2021-08-23 DIAGNOSIS — E1169 Type 2 diabetes mellitus with other specified complication: Secondary | ICD-10-CM | POA: Diagnosis not present

## 2021-08-23 DIAGNOSIS — E785 Hyperlipidemia, unspecified: Secondary | ICD-10-CM | POA: Diagnosis not present

## 2021-08-30 DIAGNOSIS — I10 Essential (primary) hypertension: Secondary | ICD-10-CM | POA: Diagnosis not present

## 2021-08-30 DIAGNOSIS — E785 Hyperlipidemia, unspecified: Secondary | ICD-10-CM | POA: Diagnosis not present

## 2021-08-30 DIAGNOSIS — E1169 Type 2 diabetes mellitus with other specified complication: Secondary | ICD-10-CM | POA: Diagnosis not present

## 2021-09-13 ENCOUNTER — Telehealth: Payer: Self-pay | Admitting: Internal Medicine

## 2021-09-13 NOTE — Telephone Encounter (Signed)
Patient called asking to be seen today.  She stated she took linzess and it didn't work and she was very sick.  I offered to make her an appointment and told her that if the issues were that bad she needed to go to the ER where we have gastric coverage   she said ok

## 2021-09-13 NOTE — Telephone Encounter (Signed)
We haven't seen her since 2021. Difficult to give any advice since it has been since Nov 2021. She wasn't taking Linzess at that visit in Nov 2021.   She needs to be seen prior to [place] providing any further prescriptions. I recommend she take OTC Miralax, as this has no taste and dissolves in water. Can take daily to BID. If she is in significant pain, vomiting, distended, needs to go to the ED.   Needs an office visit.

## 2021-09-13 NOTE — Telephone Encounter (Signed)
Phoned and spoke with the pt and advised of her needing an appt (pt states she will call back, transfer to front was offered). Advised of recommendations regarding the Miralax along with the instructions. Advised of signs for ED visit. Pt expressed understanding and will go to the ED if worsening.

## 2021-09-13 NOTE — Telephone Encounter (Signed)
Returned the pt's call and was advised by her that she is very constipated and hurting. She took a Linzess yesterday and it still didn't help. So I asked her is she taking them everyday and she replied no. It had been over a month before yesterday. She has a bottle that expired in Feb 2022. I asked her was she taking her Miralax and she replied no, she doesn't like the taste of it. I asked her what about trying a enema and she replied "I don't like them either", I asked her did Marzetta Board advised the ED if she is that backed up and she said I don't want to go and be there all day. I simply asked her well what is it you want [place] to do then if she is refusing everything that she has to take. Pt wants a new Rx for Linzess and wants to know what you recommend. Please advise.

## 2021-09-29 DIAGNOSIS — I10 Essential (primary) hypertension: Secondary | ICD-10-CM | POA: Diagnosis not present

## 2021-09-29 DIAGNOSIS — E1169 Type 2 diabetes mellitus with other specified complication: Secondary | ICD-10-CM | POA: Diagnosis not present

## 2021-09-29 DIAGNOSIS — E785 Hyperlipidemia, unspecified: Secondary | ICD-10-CM | POA: Diagnosis not present

## 2021-10-30 DIAGNOSIS — E785 Hyperlipidemia, unspecified: Secondary | ICD-10-CM | POA: Diagnosis not present

## 2021-10-30 DIAGNOSIS — E1169 Type 2 diabetes mellitus with other specified complication: Secondary | ICD-10-CM | POA: Diagnosis not present

## 2021-10-30 DIAGNOSIS — I10 Essential (primary) hypertension: Secondary | ICD-10-CM | POA: Diagnosis not present

## 2021-11-27 ENCOUNTER — Ambulatory Visit: Payer: Medicare Other | Attending: Cardiology | Admitting: Cardiology

## 2021-11-27 ENCOUNTER — Encounter: Payer: Self-pay | Admitting: Cardiology

## 2021-11-27 VITALS — BP 140/90 | HR 110 | Ht 66.0 in | Wt 169.2 lb

## 2021-11-27 DIAGNOSIS — R0789 Other chest pain: Secondary | ICD-10-CM

## 2021-11-27 DIAGNOSIS — I10 Essential (primary) hypertension: Secondary | ICD-10-CM

## 2021-11-27 NOTE — Patient Instructions (Signed)
Medication Instructions:  Your physician recommends that you continue on your current medications as directed. Please refer to the Current Medication list given to you today.   Labwork: none  Testing/Procedures: none  Follow-Up:  Your physician recommends that you schedule a follow-up appointment in: 1 year   Any Other Special Instructions Will Be Listed Below (If Applicable).  You will receive a call in about 10 months reminding you to schedule your appointment. If you do not receive this call, please contact our office.  If you need a refill on your cardiac medications before your next appointment, please call your pharmacy.  

## 2021-11-27 NOTE — Progress Notes (Signed)
Clinical Summary [name] is a 56 y.o.female  1.Chest pain/SOB - symptoms roughly 6 months - sharp pain midchest, 8/10 in severity. Occurs with activity. +SOB. Pains last a few seconds. Not positional. Symptoms occur about once a month - DOE with house work. - no recent edema   CAD risk factors: DM2, former smoker x 10 years, HTN, HL    07/2021 exerciser nuclear stress: Duke score 6 low risk, no perfusion defects  - some pains after eating at times, better with antacid - breathing stable, compliant with inhalers.        2. COPD - compliant with inhalers     3. History of CVA - she is on ASA/plavix.      4. Sarcoidosis   5,. 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   Asthma    BV (bacterial vaginosis) 11/26/2012   Constipation    CVA (cerebral vascular accident) ([place]) 02/09/2018   Diabetes mellitus    Fatigue 12/24/2012   Fibroids 01/01/2013   GERD (gastroesophageal reflux disease)    Helicobacter pylori gastritis JAN 2017   EGD Bx   Hematuria 04/05/2015   Hemorrhoid 01/01/2013   Hemorrhoids 04/05/2015   Hypertension    IBS (irritable bowel syndrome)    Neuropathy due to secondary diabetes ([place])    Other and unspecified ovarian cyst 01/01/2013   Papanicolaou smear of cervix with positive high risk human papilloma virus (HPV) test 04/22/2018   Repeat in 1 year   Sarcoidosis    Stroke ([place]) 01/2018   Vaginal discharge 02/12/2013   +yeast   Vaginal itching 02/14/2015   Vaginal odor 04/05/2015     Allergies  Allergen Reactions   Pineapple Anaphylaxis and Other (See Comments)    Mouth itches and burns.    Shellfish Allergy Anaphylaxis   Other Itching    pinapple   Tramadol     itching     Current Outpatient Medications  Medication Sig Dispense Refill   albuterol (VENTOLIN HFA) 108 (90 Base) MCG/ACT inhaler Inhale 1-2 puffs into the  lungs every 6 (six) hours as needed for wheezing or shortness of breath.     amLODipine (NORVASC) 10 MG tablet Take 1 tablet (10 mg total) by mouth daily. 90 tablet 3   aspirin EC 81 MG tablet Take 81 mg by mouth daily.     atorvastatin (LIPITOR) 40 MG tablet Take 40 mg by mouth at bedtime.     BREZTRI AEROSPHERE 160-9-4.8 MCG/ACT AERO SMARTSIG:2 Puff(s) By Mouth Twice Daily     clopidogrel (PLAVIX) 75 MG tablet Take 75 mg by mouth daily.     DEXILANT 60 MG capsule TAKE 1 CAPSULE BY MOUTH  DAILY 90 capsule 3   DULoxetine (CYMBALTA) 60 MG capsule Take 60 mg by mouth daily.     FARXIGA 5 MG TABS tablet Take 5 mg by mouth daily.     fenofibrate 160 MG tablet Take 160 mg by mouth daily.     gabapentin (NEURONTIN) 300 MG capsule Take 300 mg by mouth daily.     levocetirizine (XYZAL) 5 MG tablet Take 5 mg by mouth daily.     metFORMIN (GLUCOPHAGE) 1000 MG tablet Take 1,000 mg by mouth 2 (two) times daily with a meal.     polyethylene glycol (MIRALAX) 17 g packet Take 17 g by mouth daily as needed. 14 each  0   Tiotropium Bromide Monohydrate (SPIRIVA RESPIMAT) 2.5 MCG/ACT AERS Inhale 2 puffs into the lungs daily.     traZODone (DESYREL) 50 MG tablet TAKE 1/2 (ONE-HALF) TABLET BY MOUTH EVERY DAY AT BEDTIME     No current facility-administered medications for this visit.     Past Surgical History:  Procedure Laterality Date   BACTERIAL OVERGROWTH TEST N/A 10/21/2015   Procedure: BACTERIAL OVERGROWTH TEST;  Surgeon: Danie Binder, MD;  [facility];  Service: Endoscopy;  Laterality: N/A;  0800   CHOLECYSTECTOMY     COLONOSCOPY N/A 08/20/2013   Dr. Oneida Alar: normal mucosa in terminal ileum, mild diverticulosis in ascending colon, moderate sized hemorrhoids s/p banding   ENDOMETRIAL ABLATION     ESOPHAGOGASTRODUODENOSCOPY N/A 04/13/2015   SLF: 1. FEDA due ot erosive gastriitis/ rectal bleeding    FLEXIBLE SIGMOIDOSCOPY N/A 04/13/2015   SLF: 1. Rectal bleeding due to moderate sized internal  hemorrhoids   HEMORRHOID BANDING  08/20/2013   Procedure: HEMORRHOID BANDING;  Surgeon: Danie Binder, MD;  [facility];  Service: Endoscopy;;   HEMORRHOID BANDING  04/13/2015   Procedure: Thayer Jew;  Surgeon: Danie Binder, MD;  [facility];  Service: Endoscopy;;   HEMORRHOID SURGERY     banding   HEMORRHOID SURGERY N/A 12/25/2013   Procedure: EXTENSIVE HEMORRHOIDECTOMY;  Surgeon: Jamesetta So, MD;  Location: [facility];  Service: General;  Laterality: N/A;   SMART PILL PROCEDURE N/A 09/19/2015   Procedure: SMART PILL PROCEDURE;  Surgeon: Danie Binder, MD;  [facility];  Service: Endoscopy;  Laterality: N/A;  0800   TUBAL LIGATION       Allergies  Allergen Reactions   Pineapple Anaphylaxis and Other (See Comments)    Mouth itches and burns.    Shellfish Allergy Anaphylaxis   Other Itching    pinapple   Tramadol     itching      Family History  Problem Relation Age of Onset   Diabetes Father    Cancer Mother        throat   Cancer Brother        lung   Breast cancer Cousin    Cancer Maternal Aunt    Diabetes Maternal Aunt    Colon cancer Neg Hx      Social History [name] reports that she quit smoking about 17 years ago. Her smoking use included cigarettes. She has a 6.00 pack-year smoking history. She has never used smokeless tobacco. [name] reports current alcohol use.   Review of Systems CONSTITUTIONAL: No weight loss, fever, chills, weakness or fatigue.  HEENT: Eyes: No visual loss, blurred vision, double vision or yellow sclerae.No hearing loss, sneezing, congestion, runny nose or sore throat.  SKIN: No rash or itching.  CARDIOVASCULAR: per hpi RESPIRATORY: No shortness of breath, cough or sputum.  GASTROINTESTINAL: No anorexia, nausea, vomiting or diarrhea. No abdominal pain or blood.  GENITOURINARY: No burning on urination, no polyuria NEUROLOGICAL: No headache, dizziness, syncope, paralysis, ataxia,  numbness or tingling in the extremities. No change in bowel or bladder control.  MUSCULOSKELETAL: No muscle, back pain, joint pain or stiffness.  LYMPHATICS: No enlarged nodes. No history of splenectomy.  PSYCHIATRIC: No history of depression or anxiety.  ENDOCRINOLOGIC: No reports of sweating, cold or heat intolerance. No polyuria or polydipsia.  Marland Kitchen   Physical Examination Today's Vitals   11/27/21 1253  BP: (!) 136/90  Pulse: (!) 123  SpO2: 94%  Weight: 169 lb 3.2 oz (76.7 kg)  Height: '5\' 6"'$  (1.676 m)   Body mass index is 27.31 kg/m.  Gen: resting comfortably, no acute distress HEENT: no scleral icterus, pupils equal round and reactive, no palptable cervical adenopathy,  CV: RRR, no m/r/g no jvd Resp: Clear to auscultation bilaterally GI: abdomen is soft, non-tender, non-distended, normal bowel sounds, no hepatosplenomegaly MSK: extremities are warm, no edema.  Skin: warm, no rash Neuro:  no focal deficits Psych: appropriate affect   Diagnostic Studies 07/2021 nuclear stress  The study is normal. The study is low risk. Duke treadmill score of 6 supports low risk for major cardiac events.   No ST deviation was noted.   LV perfusion is normal.   Left ventricular function is normal. Nuclear stress EF: 57 %. The left ventricular ejection fraction is normal (55-65%). End diastolic cavity size is normal.    Assessment and Plan   1.Chest pain/DOE -negative stress test, continue risk factor modification - no recent exertoinal pains.    2. HTN - bp above goal here, will udpate [place] with home bp's on Friday - would start losartan if additional agent needed    F/u 1 year. Request labs from pcp  Arnoldo Lenis, M.D.

## 2021-11-30 DIAGNOSIS — Z8673 Personal history of transient ischemic attack (TIA), and cerebral infarction without residual deficits: Secondary | ICD-10-CM | POA: Diagnosis not present

## 2021-11-30 DIAGNOSIS — E1169 Type 2 diabetes mellitus with other specified complication: Secondary | ICD-10-CM | POA: Diagnosis not present

## 2021-11-30 DIAGNOSIS — I10 Essential (primary) hypertension: Secondary | ICD-10-CM | POA: Diagnosis not present

## 2021-11-30 DIAGNOSIS — E785 Hyperlipidemia, unspecified: Secondary | ICD-10-CM | POA: Diagnosis not present

## 2022-01-08 DIAGNOSIS — Z122 Encounter for screening for malignant neoplasm of respiratory organs: Secondary | ICD-10-CM | POA: Diagnosis not present

## 2022-01-08 DIAGNOSIS — F17200 Nicotine dependence, unspecified, uncomplicated: Secondary | ICD-10-CM | POA: Diagnosis not present

## 2022-01-08 DIAGNOSIS — D869 Sarcoidosis, unspecified: Secondary | ICD-10-CM | POA: Diagnosis not present

## 2022-01-08 DIAGNOSIS — Z87891 Personal history of nicotine dependence: Secondary | ICD-10-CM | POA: Diagnosis not present

## 2022-01-08 DIAGNOSIS — J449 Chronic obstructive pulmonary disease, unspecified: Secondary | ICD-10-CM | POA: Diagnosis not present

## 2022-01-08 DIAGNOSIS — Z23 Encounter for immunization: Secondary | ICD-10-CM | POA: Diagnosis not present

## 2022-01-08 DIAGNOSIS — F17210 Nicotine dependence, cigarettes, uncomplicated: Secondary | ICD-10-CM | POA: Diagnosis not present

## 2022-03-02 DIAGNOSIS — R112 Nausea with vomiting, unspecified: Secondary | ICD-10-CM | POA: Diagnosis not present

## 2022-03-13 DIAGNOSIS — Z8673 Personal history of transient ischemic attack (TIA), and cerebral infarction without residual deficits: Secondary | ICD-10-CM | POA: Diagnosis not present

## 2022-03-13 DIAGNOSIS — I10 Essential (primary) hypertension: Secondary | ICD-10-CM | POA: Diagnosis not present

## 2022-03-13 DIAGNOSIS — E1169 Type 2 diabetes mellitus with other specified complication: Secondary | ICD-10-CM | POA: Diagnosis not present

## 2022-03-13 DIAGNOSIS — E785 Hyperlipidemia, unspecified: Secondary | ICD-10-CM | POA: Diagnosis not present

## 2022-04-17 DIAGNOSIS — Z885 Allergy status to narcotic agent status: Secondary | ICD-10-CM | POA: Diagnosis not present

## 2022-04-17 DIAGNOSIS — Z91013 Allergy to seafood: Secondary | ICD-10-CM | POA: Diagnosis not present

## 2022-04-17 DIAGNOSIS — E119 Type 2 diabetes mellitus without complications: Secondary | ICD-10-CM | POA: Diagnosis not present

## 2022-04-17 DIAGNOSIS — Z8673 Personal history of transient ischemic attack (TIA), and cerebral infarction without residual deficits: Secondary | ICD-10-CM | POA: Diagnosis not present

## 2022-04-17 DIAGNOSIS — Z87891 Personal history of nicotine dependence: Secondary | ICD-10-CM | POA: Diagnosis not present

## 2022-04-17 DIAGNOSIS — R519 Headache, unspecified: Secondary | ICD-10-CM | POA: Diagnosis not present

## 2022-04-17 DIAGNOSIS — I10 Essential (primary) hypertension: Secondary | ICD-10-CM | POA: Diagnosis not present

## 2022-04-17 DIAGNOSIS — Z91018 Allergy to other foods: Secondary | ICD-10-CM | POA: Diagnosis not present

## 2022-04-17 DIAGNOSIS — E785 Hyperlipidemia, unspecified: Secondary | ICD-10-CM | POA: Diagnosis not present

## 2022-04-19 DIAGNOSIS — I10 Essential (primary) hypertension: Secondary | ICD-10-CM | POA: Diagnosis not present

## 2022-06-12 DIAGNOSIS — K644 Residual hemorrhoidal skin tags: Secondary | ICD-10-CM | POA: Diagnosis not present

## 2022-06-12 DIAGNOSIS — E1169 Type 2 diabetes mellitus with other specified complication: Secondary | ICD-10-CM | POA: Diagnosis not present

## 2022-06-12 DIAGNOSIS — K2100 Gastro-esophageal reflux disease with esophagitis, without bleeding: Secondary | ICD-10-CM | POA: Diagnosis not present

## 2022-06-12 DIAGNOSIS — Z8673 Personal history of transient ischemic attack (TIA), and cerebral infarction without residual deficits: Secondary | ICD-10-CM | POA: Diagnosis not present

## 2022-06-12 DIAGNOSIS — E785 Hyperlipidemia, unspecified: Secondary | ICD-10-CM | POA: Diagnosis not present

## 2022-06-12 DIAGNOSIS — Z0000 Encounter for general adult medical examination without abnormal findings: Secondary | ICD-10-CM | POA: Diagnosis not present

## 2022-06-12 DIAGNOSIS — L03317 Cellulitis of buttock: Secondary | ICD-10-CM | POA: Diagnosis not present

## 2022-06-12 DIAGNOSIS — I10 Essential (primary) hypertension: Secondary | ICD-10-CM | POA: Diagnosis not present

## 2022-06-19 DIAGNOSIS — Z87891 Personal history of nicotine dependence: Secondary | ICD-10-CM | POA: Diagnosis not present

## 2022-06-19 DIAGNOSIS — J439 Emphysema, unspecified: Secondary | ICD-10-CM | POA: Diagnosis not present

## 2022-06-19 DIAGNOSIS — I700 Atherosclerosis of aorta: Secondary | ICD-10-CM | POA: Diagnosis not present

## 2022-06-19 DIAGNOSIS — F17210 Nicotine dependence, cigarettes, uncomplicated: Secondary | ICD-10-CM | POA: Diagnosis not present

## 2022-07-01 DIAGNOSIS — K2100 Gastro-esophageal reflux disease with esophagitis, without bleeding: Secondary | ICD-10-CM | POA: Diagnosis not present

## 2022-07-01 DIAGNOSIS — E1169 Type 2 diabetes mellitus with other specified complication: Secondary | ICD-10-CM | POA: Diagnosis not present

## 2022-07-01 DIAGNOSIS — I10 Essential (primary) hypertension: Secondary | ICD-10-CM | POA: Diagnosis not present

## 2022-07-01 DIAGNOSIS — E785 Hyperlipidemia, unspecified: Secondary | ICD-10-CM | POA: Diagnosis not present

## 2022-07-10 DIAGNOSIS — Z122 Encounter for screening for malignant neoplasm of respiratory organs: Secondary | ICD-10-CM | POA: Diagnosis not present

## 2022-07-10 DIAGNOSIS — B370 Candidal stomatitis: Secondary | ICD-10-CM | POA: Diagnosis not present

## 2022-07-10 DIAGNOSIS — D869 Sarcoidosis, unspecified: Secondary | ICD-10-CM | POA: Diagnosis not present

## 2022-07-10 DIAGNOSIS — J449 Chronic obstructive pulmonary disease, unspecified: Secondary | ICD-10-CM | POA: Diagnosis not present

## 2022-07-10 DIAGNOSIS — F17201 Nicotine dependence, unspecified, in remission: Secondary | ICD-10-CM | POA: Diagnosis not present

## 2022-07-12 ENCOUNTER — Ambulatory Visit (INDEPENDENT_AMBULATORY_CARE_PROVIDER_SITE_OTHER): Payer: 59 | Admitting: General Surgery

## 2022-07-12 ENCOUNTER — Encounter: Payer: Self-pay | Admitting: General Surgery

## 2022-07-12 VITALS — BP 107/70 | HR 79 | Temp 98.0°F | Resp 12 | Ht 66.0 in | Wt 173.0 lb

## 2022-07-12 DIAGNOSIS — K644 Residual hemorrhoidal skin tags: Secondary | ICD-10-CM | POA: Diagnosis not present

## 2022-07-13 NOTE — Progress Notes (Signed)
[name]; [id number]; [birth year]   HPI Patient is a 57 year old black female who was referred back to my care for evaluation treatment of hemorrhoidal disease.  She also states she wanted me to evaluate a lump on her right buttock.  She is status post extensive hemorrhoidectomy in the remote past by myself.  She denies any blood per rectum.  She has multiple medical problems.  She is followed by gastroenterology.  She states she currently denies any rectal pain with bowel movements.  Her bowel movements are somewhat irregular.  She thinks her hemorrhoidal disease has resolved. 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   Asthma    BV (bacterial vaginosis) 11/26/2012   Constipation    CVA (cerebral vascular accident) 02/09/2018   Diabetes mellitus    Fatigue 12/24/2012   Fibroids 01/01/2013   GERD (gastroesophageal reflux disease)    Helicobacter pylori gastritis JAN 2017   EGD Bx   Hematuria 04/05/2015   Hemorrhoid 01/01/2013   Hemorrhoids 04/05/2015   Hypertension    IBS (irritable bowel syndrome)    Neuropathy due to secondary diabetes    Other and unspecified ovarian cyst 01/01/2013   Papanicolaou smear of cervix with positive high risk human papilloma virus (HPV) test 04/22/2018   Repeat in 1 year   Sarcoidosis    Stroke 01/2018   Vaginal discharge 02/12/2013   +yeast   Vaginal itching 02/14/2015   Vaginal odor 04/05/2015    Past Surgical History:  Procedure Laterality Date   BACTERIAL OVERGROWTH TEST N/A 10/21/2015   Procedure: BACTERIAL OVERGROWTH TEST;  Surgeon: West Bali, MD;  Location: [facility];  Service: Endoscopy;  Laterality: N/A;  0800   CHOLECYSTECTOMY     COLONOSCOPY N/A 08/20/2013   Dr. Darrick Penna: normal mucosa in terminal ileum, mild diverticulosis in ascending colon, moderate sized hemorrhoids s/p banding   ENDOMETRIAL ABLATION     ESOPHAGOGASTRODUODENOSCOPY N/A 04/13/2015    SLF: 1. FEDA due ot erosive gastriitis/ rectal bleeding    FLEXIBLE SIGMOIDOSCOPY N/A 04/13/2015   SLF: 1. Rectal bleeding due to moderate sized internal hemorrhoids   HEMORRHOID BANDING  08/20/2013   Procedure: HEMORRHOID BANDING;  Surgeon: West Bali, MD;  Location: [facility];  Service: Endoscopy;;   HEMORRHOID BANDING  04/13/2015   Procedure: [place];  Surgeon: West Bali, MD;  Location: [facility];  Service: Endoscopy;;   HEMORRHOID SURGERY     banding   HEMORRHOID SURGERY N/A 12/25/2013   Procedure: EXTENSIVE HEMORRHOIDECTOMY;  Surgeon: Dalia Heading, MD;  Location: [facility];  Service: General;  Laterality: N/A;   SMART PILL PROCEDURE N/A 09/19/2015   Procedure: SMART PILL PROCEDURE;  Surgeon: West Bali, MD;  Location: [facility];  Service: Endoscopy;  Laterality: N/A;  0800   TUBAL LIGATION      Family History  Problem Relation Age of Onset   Diabetes Father    Cancer Mother        throat   Cancer Brother        lung   Breast cancer Cousin    Cancer Maternal Aunt    Diabetes Maternal Aunt    Colon cancer Neg Hx     Current Outpatient Medications on File Prior to Visit  Medication Sig Dispense Refill   albuterol (VENTOLIN HFA) 108 (90 Base) MCG/ACT inhaler Inhale 1-2 puffs into the lungs every 6 (six) hours as needed for  wheezing or shortness of breath.     amLODipine (NORVASC) 10 MG tablet Take 1 tablet (10 mg total) by mouth daily. 90 tablet 3   aspirin EC 81 MG tablet Take 81 mg by mouth daily.     atorvastatin (LIPITOR) 40 MG tablet Take 40 mg by mouth at bedtime.     BREZTRI AEROSPHERE 160-9-4.8 MCG/ACT AERO SMARTSIG:2 Puff(s) By Mouth Twice Daily     clopidogrel (PLAVIX) 75 MG tablet Take 75 mg by mouth daily.     DEXILANT 60 MG capsule TAKE 1 CAPSULE BY MOUTH  DAILY 90 capsule 3   DULoxetine (CYMBALTA) 60 MG capsule Take 60 mg by mouth daily.     fenofibrate 160 MG tablet Take 160 mg by mouth daily.     gabapentin (NEURONTIN) 300 MG  capsule Take 300 mg by mouth daily.     levocetirizine (XYZAL) 5 MG tablet Take 5 mg by mouth daily.     metFORMIN (GLUCOPHAGE) 1000 MG tablet Take 1,000 mg by mouth 2 (two) times daily with a meal.     polyethylene glycol (MIRALAX) 17 g packet Take 17 g by mouth daily as needed. 14 each 0   traZODone (DESYREL) 50 MG tablet TAKE 1/2 (ONE-HALF) TABLET BY MOUTH EVERY DAY AT BEDTIME     FARXIGA 5 MG TABS tablet Take 5 mg by mouth daily. (Patient not taking: Reported on 07/12/2022)     Tiotropium Bromide Monohydrate (SPIRIVA RESPIMAT) 2.5 MCG/ACT AERS Inhale 2 puffs into the lungs daily. (Patient not taking: Reported on 07/12/2022)     No current facility-administered medications on file prior to visit.    Allergies  Allergen Reactions   Pineapple Anaphylaxis and Other (See Comments)    Mouth itches and burns.    Shellfish Allergy Anaphylaxis   Other Itching    pinapple   Tramadol     itching    Social History   Substance and Sexual Activity  Alcohol Use Yes   Comment: "once in a while"    Social History   Tobacco Use  Smoking Status Former   Packs/day: 1.00   Years: 6.00   Additional pack years: 0.00   Total pack years: 6.00   Types: Cigarettes   Quit date: 12/22/2003   Years since quitting: 18.5  Smokeless Tobacco Never  Tobacco Comments   Quit  5 years    Review of Systems  Constitutional: Negative.   HENT: Negative.    Eyes:  Positive for blurred vision.  Respiratory:  Positive for wheezing.   Gastrointestinal:  Positive for abdominal pain, heartburn and nausea.  Genitourinary: Negative.   Musculoskeletal:  Positive for back pain, joint pain and neck pain.  Skin: Negative.   Neurological: Negative.   Endo/Heme/Allergies: Negative.   Psychiatric/Behavioral: Negative.      Objective   Vitals:   07/12/22 0955  BP: 107/70  Pulse: 79  Resp: 12  Temp: 98 F (36.7 C)  SpO2: 99%    Physical Exam Vitals reviewed.  Constitutional:      Appearance: Normal  appearance. She is normal weight. She is not ill-appearing.  HENT:     Head: Normocephalic and atraumatic.  Cardiovascular:     Rate and Rhythm: Normal rate and regular rhythm.     Heart sounds: Normal heart sounds. No murmur heard.    No friction rub. No gallop.  Pulmonary:     Effort: Pulmonary effort is normal. No respiratory distress.     Breath sounds: Normal breath sounds.  No stridor. No wheezing, rhonchi or rales.  Genitourinary:    Comments: Rectal examination reveals no external hemorrhoids present.  She does have a residual hemorrhoidal skin tag at the 12 o'clock position.  No significant internal hemorrhoids noted.  No prolapsing hemorrhoids noted.  Sphincter tone fair.  No blood per rectum noted. Skin:    General: Skin is warm and dry.     Comments: No significant right buttock lesions noted.  Scarring is present which may have been a ruptured cyst.  Neurological:     Mental Status: She is alert and oriented to person, place, and time.     Assessment  External hemorrhoidal skin tag, asymptomatic Plan  No need for further surgical invention at the present time.  Patient was instructed to call me should any hemorrhoidal symptoms arise and return to my care.  She understands and agrees.  Follow-up as needed.

## 2022-08-02 ENCOUNTER — Other Ambulatory Visit (HOSPITAL_COMMUNITY)
Admission: RE | Admit: 2022-08-02 | Discharge: 2022-08-02 | Disposition: A | Discharge status: Home / Self Care | Payer: 59 | Source: Ambulatory Visit | Attending: Adult Health | Admitting: Adult Health

## 2022-08-02 ENCOUNTER — Encounter: Payer: Self-pay | Admitting: Adult Health

## 2022-08-02 ENCOUNTER — Ambulatory Visit (INDEPENDENT_AMBULATORY_CARE_PROVIDER_SITE_OTHER): Payer: 59 | Admitting: Adult Health

## 2022-08-02 VITALS — BP 119/68 | HR 96 | Ht 65.5 in | Wt 173.0 lb

## 2022-08-02 DIAGNOSIS — Z1151 Encounter for screening for human papillomavirus (HPV): Secondary | ICD-10-CM | POA: Diagnosis not present

## 2022-08-02 DIAGNOSIS — Z01419 Encounter for gynecological examination (general) (routine) without abnormal findings: Secondary | ICD-10-CM | POA: Insufficient documentation

## 2022-08-02 DIAGNOSIS — Z1231 Encounter for screening mammogram for malignant neoplasm of breast: Secondary | ICD-10-CM

## 2022-08-02 NOTE — Progress Notes (Signed)
Patient ID: [name], female   DOB: Jan 11, 1966, 57 y.o.   MRN: [id number] History of Present Illness: [name] is a 57 year old black female, single, PM, in for a well woman gyn exam and pap.  PCP is Dr Olena Leatherwood   Current Medications, Allergies, Past Medical History, Past Surgical History, Family History and Social History were reviewed in [employer] electronic medical record.     Review of Systems: Patient denies any headaches, hearing loss, fatigue, blurred vision, shortness of breath, chest pain, abdominal pain, problems with bowel movements, urination, or intercourse(not active). No joint pain or mood swings.  Denies any vaginal bleeding   Physical Exam:BP 119/68 (BP Location: Left Arm, Patient Position: Sitting, Cuff Size: Normal)   Pulse 96   Ht 5' 5.5" (1.664 m)   Wt 173 lb (78.5 kg)   BMI 28.35 kg/m   General:  Well developed, well nourished, no acute distress Skin:  Warm and dry Neck:  Midline trachea, normal thyroid, good ROM, no lymphadenopathy Lungs; Clear to auscultation bilaterally Breast:  No dominant palpable mass, retraction, or nipple discharge Cardiovascular: Regular rate and rhythm Abdomen:  Soft, non tender, no hepatosplenomegaly Pelvic:  External genitalia is normal in appearance, no lesions.  The vagina is pale. Urethra has no lesions or masses. The cervix is smooth, pap with GC/CHL and  HR HPV genotyping performed.  Uterus is felt to be normal size, shape, and contour.  No adnexal masses or tenderness noted.Bladder is non tender, no masses felt. Rectal: Deferred, she had exam with Dr Lovell Sheehan 07/12/22. Extremities/musculoskeletal:  No swelling or varicosities noted, no clubbing or cyanosis Psych:  No mood changes, alert and cooperative,seems happy AA is 0 Fall risk is low    08/02/2022   10:28 AM 04/16/2018    1:53 PM 06/11/2017    3:33 PM  Depression screen PHQ 2/9  Decreased Interest 1 0 1  Down, Depressed, Hopeless 0 0 1  PHQ - 2 Score 1 0 2   Altered sleeping 1  3  Tired, decreased energy 1  3  Change in appetite 0  3  Feeling bad or failure about yourself  1  0  Trouble concentrating 0  0  Moving slowly or fidgety/restless 0  0  Suicidal thoughts 0  0  PHQ-9 Score 4  11       08/02/2022   10:28 AM  GAD 7 : Generalized Anxiety Score  Nervous, Anxious, on Edge 1  Control/stop worrying 1  Worry too much - different things 1  Trouble relaxing 0  Restless 0  Easily annoyed or irritable 0  Afraid - awful might happen 1  Total GAD 7 Score 4      Upstream - 08/02/22 1043       Pregnancy Intention Screening   Does the patient want to become pregnant in the next year? No    Does the patient's partner want to become pregnant in the next year? No    Would the patient like to discuss contraceptive options today? No      Contraception Wrap Up   Current Method Female Sterilization    End Method Female Sterilization    Contraception Counseling Provided No            Examination chaperoned by Malachy Mood LPN   Impression and Plan: 1. Encounter for gynecological examination with Papanicolaou smear of cervix Pap sent Pap in 3 years if normal Physical in 1 year Labs with PCP Colonoscopy per GI  -  Cytology - PAP( [facility])  2. Screening mammogram for breast cancer Mammogram scheduled for her 08/22/22 at [facility] for 10:45 am  - MM 3D SCREENING MAMMOGRAM BILATERAL BREAST; Future

## 2022-08-06 LAB — CYTOLOGY - PAP
Adequacy: ABSENT
Chlamydia: NEGATIVE
Comment: NEGATIVE
Comment: NEGATIVE
Comment: NORMAL
Diagnosis: NEGATIVE
High risk HPV: NEGATIVE
Neisseria Gonorrhea: NEGATIVE

## 2022-08-22 ENCOUNTER — Ambulatory Visit (HOSPITAL_COMMUNITY)
Admission: RE | Admit: 2022-08-22 | Discharge: 2022-08-22 | Disposition: A | Discharge status: Home / Self Care | Payer: 59 | Source: Ambulatory Visit | Attending: Adult Health | Admitting: Adult Health

## 2022-08-22 DIAGNOSIS — Z1231 Encounter for screening mammogram for malignant neoplasm of breast: Secondary | ICD-10-CM | POA: Insufficient documentation

## 2022-08-23 ENCOUNTER — Other Ambulatory Visit (HOSPITAL_COMMUNITY): Payer: Self-pay | Admitting: Adult Health

## 2022-08-23 DIAGNOSIS — R928 Other abnormal and inconclusive findings on diagnostic imaging of breast: Secondary | ICD-10-CM

## 2022-08-28 ENCOUNTER — Ambulatory Visit (HOSPITAL_COMMUNITY)
Admission: RE | Admit: 2022-08-28 | Discharge: 2022-08-28 | Disposition: A | Discharge status: Home / Self Care | Payer: 59 | Source: Ambulatory Visit | Attending: Adult Health | Admitting: Adult Health

## 2022-08-28 ENCOUNTER — Encounter (HOSPITAL_COMMUNITY): Payer: Self-pay

## 2022-08-28 DIAGNOSIS — R928 Other abnormal and inconclusive findings on diagnostic imaging of breast: Secondary | ICD-10-CM

## 2022-08-28 DIAGNOSIS — R922 Inconclusive mammogram: Secondary | ICD-10-CM | POA: Diagnosis not present

## 2022-08-28 DIAGNOSIS — N6002 Solitary cyst of left breast: Secondary | ICD-10-CM | POA: Diagnosis not present

## 2022-09-08 ENCOUNTER — Other Ambulatory Visit: Payer: Self-pay | Admitting: Cardiology

## 2022-09-11 DIAGNOSIS — L03317 Cellulitis of buttock: Secondary | ICD-10-CM | POA: Diagnosis not present

## 2022-09-11 DIAGNOSIS — I10 Essential (primary) hypertension: Secondary | ICD-10-CM | POA: Diagnosis not present

## 2022-09-11 DIAGNOSIS — E1169 Type 2 diabetes mellitus with other specified complication: Secondary | ICD-10-CM | POA: Diagnosis not present

## 2022-09-11 DIAGNOSIS — I700 Atherosclerosis of aorta: Secondary | ICD-10-CM | POA: Diagnosis not present

## 2022-09-11 DIAGNOSIS — K2100 Gastro-esophageal reflux disease with esophagitis, without bleeding: Secondary | ICD-10-CM | POA: Diagnosis not present

## 2022-09-11 DIAGNOSIS — Z0000 Encounter for general adult medical examination without abnormal findings: Secondary | ICD-10-CM | POA: Diagnosis not present

## 2022-09-11 DIAGNOSIS — Z8673 Personal history of transient ischemic attack (TIA), and cerebral infarction without residual deficits: Secondary | ICD-10-CM | POA: Diagnosis not present

## 2022-09-11 DIAGNOSIS — E785 Hyperlipidemia, unspecified: Secondary | ICD-10-CM | POA: Diagnosis not present

## 2022-10-13 DIAGNOSIS — J449 Chronic obstructive pulmonary disease, unspecified: Secondary | ICD-10-CM | POA: Diagnosis not present

## 2022-10-13 DIAGNOSIS — E119 Type 2 diabetes mellitus without complications: Secondary | ICD-10-CM | POA: Diagnosis not present

## 2022-10-13 DIAGNOSIS — S39012A Strain of muscle, fascia and tendon of lower back, initial encounter: Secondary | ICD-10-CM | POA: Diagnosis not present

## 2022-10-13 DIAGNOSIS — M549 Dorsalgia, unspecified: Secondary | ICD-10-CM | POA: Diagnosis not present

## 2022-10-13 DIAGNOSIS — M546 Pain in thoracic spine: Secondary | ICD-10-CM | POA: Diagnosis not present

## 2022-10-13 DIAGNOSIS — I10 Essential (primary) hypertension: Secondary | ICD-10-CM | POA: Diagnosis not present

## 2022-10-13 DIAGNOSIS — S29012A Strain of muscle and tendon of back wall of thorax, initial encounter: Secondary | ICD-10-CM | POA: Diagnosis not present

## 2022-10-13 DIAGNOSIS — Z87891 Personal history of nicotine dependence: Secondary | ICD-10-CM | POA: Diagnosis not present

## 2022-10-13 DIAGNOSIS — X58XXXA Exposure to other specified factors, initial encounter: Secondary | ICD-10-CM | POA: Diagnosis not present

## 2022-10-24 DIAGNOSIS — M62830 Muscle spasm of back: Secondary | ICD-10-CM | POA: Diagnosis not present

## 2022-10-24 DIAGNOSIS — I10 Essential (primary) hypertension: Secondary | ICD-10-CM | POA: Diagnosis not present

## 2022-12-10 ENCOUNTER — Other Ambulatory Visit: Payer: Self-pay | Admitting: Cardiology

## 2022-12-18 DIAGNOSIS — I700 Atherosclerosis of aorta: Secondary | ICD-10-CM | POA: Diagnosis not present

## 2022-12-18 DIAGNOSIS — N1831 Chronic kidney disease, stage 3a: Secondary | ICD-10-CM | POA: Diagnosis not present

## 2022-12-18 DIAGNOSIS — I10 Essential (primary) hypertension: Secondary | ICD-10-CM | POA: Diagnosis not present

## 2022-12-18 DIAGNOSIS — M62830 Muscle spasm of back: Secondary | ICD-10-CM | POA: Diagnosis not present

## 2022-12-18 DIAGNOSIS — E1169 Type 2 diabetes mellitus with other specified complication: Secondary | ICD-10-CM | POA: Diagnosis not present

## 2022-12-18 DIAGNOSIS — K2100 Gastro-esophageal reflux disease with esophagitis, without bleeding: Secondary | ICD-10-CM | POA: Diagnosis not present

## 2022-12-18 DIAGNOSIS — Z8673 Personal history of transient ischemic attack (TIA), and cerebral infarction without residual deficits: Secondary | ICD-10-CM | POA: Diagnosis not present

## 2022-12-18 DIAGNOSIS — E785 Hyperlipidemia, unspecified: Secondary | ICD-10-CM | POA: Diagnosis not present

## 2023-01-22 DIAGNOSIS — Z23 Encounter for immunization: Secondary | ICD-10-CM | POA: Diagnosis not present

## 2023-01-22 DIAGNOSIS — R1013 Epigastric pain: Secondary | ICD-10-CM | POA: Diagnosis not present

## 2023-01-22 DIAGNOSIS — Z122 Encounter for screening for malignant neoplasm of respiratory organs: Secondary | ICD-10-CM | POA: Diagnosis not present

## 2023-01-22 DIAGNOSIS — J441 Chronic obstructive pulmonary disease with (acute) exacerbation: Secondary | ICD-10-CM | POA: Diagnosis not present

## 2023-01-28 ENCOUNTER — Encounter: Payer: Self-pay | Admitting: Nurse Practitioner

## 2023-01-28 ENCOUNTER — Ambulatory Visit: Payer: 59 | Attending: Nurse Practitioner | Admitting: Nurse Practitioner

## 2023-01-28 VITALS — BP 121/62 | HR 90 | Ht 64.0 in | Wt 162.8 lb

## 2023-01-28 DIAGNOSIS — E785 Hyperlipidemia, unspecified: Secondary | ICD-10-CM | POA: Diagnosis not present

## 2023-01-28 DIAGNOSIS — I10 Essential (primary) hypertension: Secondary | ICD-10-CM | POA: Diagnosis not present

## 2023-01-28 DIAGNOSIS — D869 Sarcoidosis, unspecified: Secondary | ICD-10-CM

## 2023-01-28 DIAGNOSIS — J449 Chronic obstructive pulmonary disease, unspecified: Secondary | ICD-10-CM | POA: Diagnosis not present

## 2023-01-28 MED ORDER — OMRON 3 SERIES BP MONITOR DEVI
1 refills | Status: AC
Start: 2023-01-28 — End: ?

## 2023-01-28 NOTE — Patient Instructions (Signed)

## 2023-01-28 NOTE — Progress Notes (Signed)
  Cardiology Office Note:  .   Date:  01/28/2023  ID:  Herbert Pun, DOB Aug 22, 1965, MRN [id number] PCP: Toma Deiters, MD  [facility] Providers Cardiologist:  Dina Rich, MD    History of Present Illness: [name] is a 57 y.o. female with a PMH of chest pain, shortness of breath, type 2 diabetes, former smoker, hypertension, hyperlipidemia, COPD, sarcoidosis, and past history of CVA, who presents today for overdue 1 year follow-up.   Negative stress test in 2023.  Last seen by Dr. Dina Rich on November 27, 2021.  Blood pressure was above goal, recommended to update clinic on home BPs later that week.  Recommended if BP was still not at goal to add losartan if additional agent was needed.  Today she presents for overdue 1 year follow-up.  She states she is doing well. Denies any acute cardiac complaints or issues. Denies any chest pain, shortness of breath, palpitations, syncope, presyncope, dizziness, orthopnea, PND, swelling or significant weight changes, acute bleeding, or claudication.  ROS: Negative. See HPI.   Studies Reviewed: Marland Kitchen    Lexiscan 07/2021:    The study is normal. The study is low risk. Duke treadmill score of 6 supports low risk for major cardiac events.   No ST deviation was noted.   LV perfusion is normal.   Left ventricular function is normal. Nuclear stress EF: 57 %. The left ventricular ejection fraction is normal (55-65%). End diastolic cavity size is normal.  Carotid duplex 04/2018:   IMPRESSION: Less than 50% stenosis in the right and left internal carotid arteries.   Physical Exam:   VS:  BP 121/62 (BP Location: Left Arm, Patient Position: Sitting, Cuff Size: Normal)   Pulse 90   Ht 5\' 4"  (1.626 m)   Wt 162 lb 12.8 oz (73.8 kg)   SpO2 100%   BMI 27.94 kg/m    Wt Readings from Last 3 Encounters:  01/28/23 162 lb 12.8 oz (73.8 kg)  08/02/22 173 lb (78.5 kg)  07/12/22 173 lb (78.5 kg)    GEN: Well nourished, well  developed in no acute distress NECK: No JVD; No carotid bruits CARDIAC: S1/S2, RRR, no murmurs, rubs, gallops RESPIRATORY:  Clear to auscultation with some inspiratory/expiratory wheezing noted to left upper lobe, otherwise WNL.  ABDOMEN: Soft, non-tender, non-distended EXTREMITIES:  No edema; No deformity   ASSESSMENT AND PLAN: .    HTN Blood pressure borderline elevated on arrival, repeat BP is at goal. Will send in Rx for Omron cuff. Discussed to monitor BP at home at least 2 hours after medications and sitting for 5-10 minutes.  Will continue to monitor at this time.  No medication changes at this time. Heart healthy diet and regular cardiovascular exercise encouraged.   HLD LDL June 2024 was 89.  Continue atorvastatin.  Currently being managed by PCP. Heart healthy diet and regular cardiovascular exercise encouraged.   COPD, sarcoidosis Some minimal inspiratory/expiratory wheezing noted on exam.  Etiology due to COPD and sarcoidosis.  Patient states she has not taken her medication this morning.  Continue follow-up with PCP as scheduled.   Dispo: Follow-up with Dr. Dina Rich or APP in 1 year or sooner if anything changes.  Signed, Sharlene Dory, NP

## 2023-03-21 ENCOUNTER — Other Ambulatory Visit: Payer: Self-pay | Admitting: Cardiology

## 2023-04-02 DIAGNOSIS — E1122 Type 2 diabetes mellitus with diabetic chronic kidney disease: Secondary | ICD-10-CM | POA: Diagnosis not present

## 2023-04-02 DIAGNOSIS — N1831 Chronic kidney disease, stage 3a: Secondary | ICD-10-CM | POA: Diagnosis not present

## 2023-04-02 DIAGNOSIS — K2100 Gastro-esophageal reflux disease with esophagitis, without bleeding: Secondary | ICD-10-CM | POA: Diagnosis not present

## 2023-04-02 DIAGNOSIS — I10 Essential (primary) hypertension: Secondary | ICD-10-CM | POA: Diagnosis not present

## 2023-04-02 DIAGNOSIS — E1169 Type 2 diabetes mellitus with other specified complication: Secondary | ICD-10-CM | POA: Diagnosis not present

## 2023-04-02 DIAGNOSIS — Z8673 Personal history of transient ischemic attack (TIA), and cerebral infarction without residual deficits: Secondary | ICD-10-CM | POA: Diagnosis not present

## 2023-04-02 DIAGNOSIS — I700 Atherosclerosis of aorta: Secondary | ICD-10-CM | POA: Diagnosis not present

## 2023-04-02 DIAGNOSIS — E785 Hyperlipidemia, unspecified: Secondary | ICD-10-CM | POA: Diagnosis not present

## 2023-04-05 DIAGNOSIS — R808 Other proteinuria: Secondary | ICD-10-CM | POA: Diagnosis not present

## 2023-07-02 DIAGNOSIS — E1122 Type 2 diabetes mellitus with diabetic chronic kidney disease: Secondary | ICD-10-CM | POA: Diagnosis not present

## 2023-07-02 DIAGNOSIS — Z0000 Encounter for general adult medical examination without abnormal findings: Secondary | ICD-10-CM | POA: Diagnosis not present

## 2023-07-02 DIAGNOSIS — Z8673 Personal history of transient ischemic attack (TIA), and cerebral infarction without residual deficits: Secondary | ICD-10-CM | POA: Diagnosis not present

## 2023-07-02 DIAGNOSIS — I700 Atherosclerosis of aorta: Secondary | ICD-10-CM | POA: Diagnosis not present

## 2023-07-02 DIAGNOSIS — K2100 Gastro-esophageal reflux disease with esophagitis, without bleeding: Secondary | ICD-10-CM | POA: Diagnosis not present

## 2023-07-02 DIAGNOSIS — N1832 Chronic kidney disease, stage 3b: Secondary | ICD-10-CM | POA: Diagnosis not present

## 2023-07-02 DIAGNOSIS — E7849 Other hyperlipidemia: Secondary | ICD-10-CM | POA: Diagnosis not present

## 2023-07-02 DIAGNOSIS — I10 Essential (primary) hypertension: Secondary | ICD-10-CM | POA: Diagnosis not present

## 2023-07-17 DIAGNOSIS — J449 Chronic obstructive pulmonary disease, unspecified: Secondary | ICD-10-CM | POA: Diagnosis not present

## 2023-07-17 DIAGNOSIS — D869 Sarcoidosis, unspecified: Secondary | ICD-10-CM | POA: Diagnosis not present

## 2023-07-17 DIAGNOSIS — N184 Chronic kidney disease, stage 4 (severe): Secondary | ICD-10-CM | POA: Diagnosis not present

## 2023-07-17 DIAGNOSIS — J441 Chronic obstructive pulmonary disease with (acute) exacerbation: Secondary | ICD-10-CM | POA: Diagnosis not present

## 2023-07-17 DIAGNOSIS — Z122 Encounter for screening for malignant neoplasm of respiratory organs: Secondary | ICD-10-CM | POA: Diagnosis not present

## 2023-08-05 DIAGNOSIS — K5909 Other constipation: Secondary | ICD-10-CM | POA: Diagnosis not present

## 2023-08-08 DIAGNOSIS — Z885 Allergy status to narcotic agent status: Secondary | ICD-10-CM | POA: Diagnosis not present

## 2023-08-08 DIAGNOSIS — R911 Solitary pulmonary nodule: Secondary | ICD-10-CM | POA: Diagnosis not present

## 2023-08-08 DIAGNOSIS — J449 Chronic obstructive pulmonary disease, unspecified: Secondary | ICD-10-CM | POA: Diagnosis not present

## 2023-08-08 DIAGNOSIS — J4A9 Chronic lung allograft dysfunction, unspecified: Secondary | ICD-10-CM | POA: Diagnosis not present

## 2023-08-08 DIAGNOSIS — Z91013 Allergy to seafood: Secondary | ICD-10-CM | POA: Diagnosis not present

## 2023-08-08 DIAGNOSIS — J90 Pleural effusion, not elsewhere classified: Secondary | ICD-10-CM | POA: Diagnosis not present

## 2023-08-08 DIAGNOSIS — J439 Emphysema, unspecified: Secondary | ICD-10-CM | POA: Diagnosis not present

## 2023-08-13 DIAGNOSIS — D869 Sarcoidosis, unspecified: Secondary | ICD-10-CM | POA: Diagnosis not present

## 2023-08-13 DIAGNOSIS — I10 Essential (primary) hypertension: Secondary | ICD-10-CM | POA: Diagnosis not present

## 2023-08-13 DIAGNOSIS — N1832 Chronic kidney disease, stage 3b: Secondary | ICD-10-CM | POA: Diagnosis not present

## 2023-08-13 DIAGNOSIS — E119 Type 2 diabetes mellitus without complications: Secondary | ICD-10-CM | POA: Diagnosis not present

## 2023-08-20 ENCOUNTER — Encounter: Payer: Self-pay | Admitting: Adult Health

## 2023-08-20 ENCOUNTER — Ambulatory Visit: Admitting: Adult Health

## 2023-08-20 VITALS — BP 95/52 | HR 103 | Ht 65.0 in | Wt 170.0 lb

## 2023-08-20 DIAGNOSIS — Z01419 Encounter for gynecological examination (general) (routine) without abnormal findings: Secondary | ICD-10-CM | POA: Insufficient documentation

## 2023-08-20 DIAGNOSIS — Z1231 Encounter for screening mammogram for malignant neoplasm of breast: Secondary | ICD-10-CM

## 2023-08-20 NOTE — Progress Notes (Signed)
 Patient ID: [name], female   DOB: 1965/06/13, 58 y.o.   MRN: [id number] History of Present Illness: [name] is a 58 year old black female,single, PM in for a well woman gyn exam.     Component Value Date/Time   DIAGPAP  08/02/2022 1045    - Negative for intraepithelial lesion or malignancy (NILM)   DIAGPAP  04/16/2018 0000    NEGATIVE FOR INTRAEPITHELIAL LESIONS OR MALIGNANCY. BENIGN REACTIVE/REPARATIVE CHANGES.   DIAGPAP  04/09/2016 0000    NEGATIVE FOR INTRAEPITHELIAL LESIONS OR MALIGNANCY.   HPVHIGH Negative 08/02/2022 1045   ADEQPAP  08/02/2022 1045    Satisfactory for evaluation; transformation zone component ABSENT.   ADEQPAP  04/16/2018 0000    Satisfactory for evaluation  endocervical/transformation zone component ABSENT.   ADEQPAP  04/09/2016 0000    Satisfactory for evaluation  endocervical/transformation zone component PRESENT.    PCP is Dr Lorraine Roses   Current Medications, Allergies, Past Medical History, Past Surgical History, Family History and Social History were reviewed in [employer] electronic medical record.     Review of Systems: Patient denies any headaches, hearing loss, fatigue, blurred vision, shortness of breath, chest pain, abdominal pain, problems with bowel movements(on linzess  now), urination, or intercourse. No joint pain or mood swings.     Physical Exam:BP (!) 95/52 (BP Location: Left Arm, Patient Position: Sitting, Cuff Size: Normal)   Pulse (!) 103   Ht 5\' 5"  (1.651 m)   Wt 170 lb (77.1 kg)   BMI 28.29 kg/m   General:  Well developed, well nourished, no acute distress Skin:  Warm and dry Neck:  Midline trachea, normal thyroid , good ROM, no lymphadenopathy Lungs; Clear to auscultation bilaterally Breast:  No dominant palpable mass, retraction, or nipple discharge Cardiovascular: Regular rate and rhythm Abdomen:  Soft, non tender, no hepatosplenomegaly Pelvic:  External genitalia is normal in appearance, no lesions.  The vagina  is normal in appearance. Urethra has no lesions or masses. The cervix is smooth.  Uterus is felt to be normal size, shape, and contour.  No adnexal masses or tenderness noted.Bladder is non tender, no masses felt. Rectal: Deferred at pt request Extremities/musculoskeletal:  No swelling or varicosities noted, no clubbing or cyanosis Psych:  No mood changes, alert and cooperative,seems happy AA is 0    08/20/2023   10:36 AM 08/02/2022   10:28 AM 04/16/2018    1:53 PM  Depression screen PHQ 2/9  Decreased Interest 2 1 0  Down, Depressed, Hopeless 0 0 0  PHQ - 2 Score 2 1 0  Altered sleeping 0 1   Tired, decreased energy 3 1   Change in appetite 0 0   Feeling bad or failure about yourself  0 1   Trouble concentrating 0 0   Moving slowly or fidgety/restless 0 0   Suicidal thoughts 0 0   PHQ-9 Score 5 4        08/20/2023   10:36 AM 08/02/2022   10:28 AM  GAD 7 : Generalized Anxiety Score  Nervous, Anxious, on Edge 0 1  Control/stop worrying 0 1  Worry too much - different things 0 1  Trouble relaxing 0 0  Restless 0 0  Easily annoyed or irritable 0 0  Afraid - awful might happen 0 1  Total GAD 7 Score 0 4    Upstream - 08/20/23 1035       Pregnancy Intention Screening   Does the patient want to become pregnant in the next year? No  Does the patient's partner want to become pregnant in the next year? No    Would the patient like to discuss contraceptive options today? No      Contraception Wrap Up   Current Method Female Sterilization    End Method Female Sterilization            Examination chaperoned by Wendell Halt RN     Impression and plan: 1. Women's annual routine gynecological examination (Primary) Physical in 1 year Labs with PCP Pap in 2027 Stay active   2. Screening mammogram for breast cancer Has mammogram scheduled in [place] for 08/27/23, send me a copy

## 2023-08-21 DIAGNOSIS — M8580 Other specified disorders of bone density and structure, unspecified site: Secondary | ICD-10-CM | POA: Diagnosis not present

## 2023-08-21 DIAGNOSIS — Z780 Asymptomatic menopausal state: Secondary | ICD-10-CM | POA: Diagnosis not present

## 2023-08-21 DIAGNOSIS — M810 Age-related osteoporosis without current pathological fracture: Secondary | ICD-10-CM | POA: Diagnosis not present

## 2023-08-27 DIAGNOSIS — Z1231 Encounter for screening mammogram for malignant neoplasm of breast: Secondary | ICD-10-CM | POA: Diagnosis not present

## 2023-08-30 DIAGNOSIS — Z885 Allergy status to narcotic agent status: Secondary | ICD-10-CM | POA: Diagnosis not present

## 2023-08-30 DIAGNOSIS — N281 Cyst of kidney, acquired: Secondary | ICD-10-CM | POA: Diagnosis not present

## 2023-08-30 DIAGNOSIS — Z91018 Allergy to other foods: Secondary | ICD-10-CM | POA: Diagnosis not present

## 2023-08-30 DIAGNOSIS — Z91013 Allergy to seafood: Secondary | ICD-10-CM | POA: Diagnosis not present

## 2023-08-30 DIAGNOSIS — N1832 Chronic kidney disease, stage 3b: Secondary | ICD-10-CM | POA: Diagnosis not present

## 2023-09-13 ENCOUNTER — Other Ambulatory Visit: Payer: Self-pay | Admitting: Cardiology

## 2023-09-16 DIAGNOSIS — E875 Hyperkalemia: Secondary | ICD-10-CM | POA: Diagnosis not present

## 2023-09-16 DIAGNOSIS — I10 Essential (primary) hypertension: Secondary | ICD-10-CM | POA: Diagnosis not present

## 2023-10-01 DIAGNOSIS — Z0000 Encounter for general adult medical examination without abnormal findings: Secondary | ICD-10-CM | POA: Diagnosis not present

## 2023-10-01 DIAGNOSIS — J449 Chronic obstructive pulmonary disease, unspecified: Secondary | ICD-10-CM | POA: Diagnosis not present

## 2023-10-01 DIAGNOSIS — I10 Essential (primary) hypertension: Secondary | ICD-10-CM | POA: Diagnosis not present

## 2023-10-01 DIAGNOSIS — Z8673 Personal history of transient ischemic attack (TIA), and cerebral infarction without residual deficits: Secondary | ICD-10-CM | POA: Diagnosis not present

## 2023-10-01 DIAGNOSIS — I700 Atherosclerosis of aorta: Secondary | ICD-10-CM | POA: Diagnosis not present

## 2023-10-01 DIAGNOSIS — J439 Emphysema, unspecified: Secondary | ICD-10-CM | POA: Diagnosis not present

## 2023-10-01 DIAGNOSIS — N1832 Chronic kidney disease, stage 3b: Secondary | ICD-10-CM | POA: Diagnosis not present

## 2023-10-01 DIAGNOSIS — K219 Gastro-esophageal reflux disease without esophagitis: Secondary | ICD-10-CM | POA: Diagnosis not present

## 2023-10-01 DIAGNOSIS — K5909 Other constipation: Secondary | ICD-10-CM | POA: Diagnosis not present

## 2023-10-01 DIAGNOSIS — E1122 Type 2 diabetes mellitus with diabetic chronic kidney disease: Secondary | ICD-10-CM | POA: Diagnosis not present

## 2023-10-01 DIAGNOSIS — G629 Polyneuropathy, unspecified: Secondary | ICD-10-CM | POA: Diagnosis not present

## 2023-11-28 DIAGNOSIS — J209 Acute bronchitis, unspecified: Secondary | ICD-10-CM | POA: Diagnosis not present

## 2023-11-28 DIAGNOSIS — Z8673 Personal history of transient ischemic attack (TIA), and cerebral infarction without residual deficits: Secondary | ICD-10-CM | POA: Diagnosis not present

## 2023-11-28 DIAGNOSIS — J8410 Pulmonary fibrosis, unspecified: Secondary | ICD-10-CM | POA: Diagnosis not present

## 2023-11-28 DIAGNOSIS — R5383 Other fatigue: Secondary | ICD-10-CM | POA: Diagnosis not present

## 2023-11-28 DIAGNOSIS — J439 Emphysema, unspecified: Secondary | ICD-10-CM | POA: Diagnosis not present

## 2023-11-28 DIAGNOSIS — E1122 Type 2 diabetes mellitus with diabetic chronic kidney disease: Secondary | ICD-10-CM | POA: Diagnosis not present

## 2023-11-28 DIAGNOSIS — J449 Chronic obstructive pulmonary disease, unspecified: Secondary | ICD-10-CM | POA: Diagnosis not present

## 2023-11-28 DIAGNOSIS — R0602 Shortness of breath: Secondary | ICD-10-CM | POA: Diagnosis not present

## 2023-11-28 DIAGNOSIS — I10 Essential (primary) hypertension: Secondary | ICD-10-CM | POA: Diagnosis not present

## 2023-11-28 DIAGNOSIS — J440 Chronic obstructive pulmonary disease with acute lower respiratory infection: Secondary | ICD-10-CM | POA: Diagnosis not present

## 2023-11-28 DIAGNOSIS — N1832 Chronic kidney disease, stage 3b: Secondary | ICD-10-CM | POA: Diagnosis not present

## 2023-11-28 DIAGNOSIS — R079 Chest pain, unspecified: Secondary | ICD-10-CM | POA: Diagnosis not present

## 2023-12-05 DIAGNOSIS — E872 Acidosis, unspecified: Secondary | ICD-10-CM | POA: Diagnosis not present

## 2023-12-05 DIAGNOSIS — N184 Chronic kidney disease, stage 4 (severe): Secondary | ICD-10-CM | POA: Diagnosis not present

## 2023-12-05 DIAGNOSIS — R5383 Other fatigue: Secondary | ICD-10-CM | POA: Diagnosis not present

## 2023-12-05 DIAGNOSIS — E875 Hyperkalemia: Secondary | ICD-10-CM | POA: Diagnosis not present

## 2023-12-05 DIAGNOSIS — D509 Iron deficiency anemia, unspecified: Secondary | ICD-10-CM | POA: Diagnosis not present

## 2023-12-05 DIAGNOSIS — N179 Acute kidney failure, unspecified: Secondary | ICD-10-CM | POA: Diagnosis not present

## 2023-12-10 DIAGNOSIS — N184 Chronic kidney disease, stage 4 (severe): Secondary | ICD-10-CM | POA: Diagnosis not present

## 2023-12-10 DIAGNOSIS — D509 Iron deficiency anemia, unspecified: Secondary | ICD-10-CM | POA: Diagnosis not present

## 2023-12-12 DIAGNOSIS — N1832 Chronic kidney disease, stage 3b: Secondary | ICD-10-CM | POA: Diagnosis not present

## 2023-12-12 DIAGNOSIS — I10 Essential (primary) hypertension: Secondary | ICD-10-CM | POA: Diagnosis not present

## 2023-12-12 DIAGNOSIS — N184 Chronic kidney disease, stage 4 (severe): Secondary | ICD-10-CM | POA: Diagnosis not present

## 2023-12-12 DIAGNOSIS — N179 Acute kidney failure, unspecified: Secondary | ICD-10-CM | POA: Diagnosis not present

## 2023-12-12 DIAGNOSIS — D509 Iron deficiency anemia, unspecified: Secondary | ICD-10-CM | POA: Diagnosis not present

## 2023-12-16 ENCOUNTER — Other Ambulatory Visit (HOSPITAL_COMMUNITY)
Admission: RE | Admit: 2023-12-16 | Discharge: 2023-12-16 | Disposition: A | Discharge status: Home / Self Care | Source: Ambulatory Visit | Attending: Internal Medicine | Admitting: Internal Medicine

## 2023-12-16 DIAGNOSIS — D509 Iron deficiency anemia, unspecified: Secondary | ICD-10-CM | POA: Diagnosis not present

## 2023-12-16 LAB — OCCULT BLOOD X 1 CARD TO LAB, STOOL
Fecal Occult Bld: NEGATIVE
Fecal Occult Bld: NEGATIVE
Fecal Occult Bld: NEGATIVE

## 2024-01-15 DIAGNOSIS — D869 Sarcoidosis, unspecified: Secondary | ICD-10-CM | POA: Diagnosis not present

## 2024-01-15 DIAGNOSIS — J449 Chronic obstructive pulmonary disease, unspecified: Secondary | ICD-10-CM | POA: Diagnosis not present

## 2024-01-15 DIAGNOSIS — J302 Other seasonal allergic rhinitis: Secondary | ICD-10-CM | POA: Diagnosis not present

## 2024-01-15 DIAGNOSIS — Z122 Encounter for screening for malignant neoplasm of respiratory organs: Secondary | ICD-10-CM | POA: Diagnosis not present

## 2024-01-21 DIAGNOSIS — N1832 Chronic kidney disease, stage 3b: Secondary | ICD-10-CM | POA: Diagnosis not present

## 2024-01-21 DIAGNOSIS — I10 Essential (primary) hypertension: Secondary | ICD-10-CM | POA: Diagnosis not present

## 2024-01-21 NOTE — Progress Notes (Signed)
 [facility] Office Visit  Patient Name: [name], Female Date of Birth:  11-03-1965, 58 y.o. Medical Record # [id number] Date:   01/20/2024  Assessment & Plan Acute kidney injury on top of CKD, acute kidney injury could be from prerenal/hypotension/overdiuresis.    Serum creatinine has improved.   GFR is higher now at 37 mL/min   -  she has stopped furosemide, olmesartan  -  her blood pressure is better and she feels better,  less fatigue,  eating better -  continue adequate fluids -   her blood pressure is in the 150/70 and heart rate is 112 regular I have advised her to stop amlodipine  and changed to metoprolol 12.5 mg twice daily -   BP monitoring twice a day -  avoid NSAIDs, avoid IV contrast, Bactrim -  early follow-up in 3 months   Hypertension -  patient's blood pressure is  is noted.   tachycardia is also noted.    Will discontinue amlodipine  and changed to metoprolol 12.5 mg twice daily   Iron deficiency anemia.  -    From blood work from PCP dated 11/28/2023 - patient has a hemoglobin of 10 and a TSAT of only 8%.  She is started on  ferrous sulfate 325 mg twice a day by PCP. -  Advised to monitor for GI bleeding -  stool for occult blood test is ordered -  I will repeat CBC today SPEP is pending -  PCP may consider gastroenterology referral   Hyperkalemia and metabolic acidosis -    this has improved with improvement of renal function    History of Present Illness [name] is a 58 y.o. female  is here for follow-up.  She is accompanied by her pastor.  She has been feeling much better since her blood pressure has improved.  Appetite has improved.  No nausea or vomiting no shortness of breath.  No presyncope.  No urine problems.  The following portions of the patient's chart were reviewed in this encounter and updated as appropriate:  Tobacco  Allergies  Meds  Fam Hx  Soc Hx     Past Medical History Past Medical History:  Diagnosis Date   . Chronic airway obstruction, not elsewhere classified (HCC)   . Chronic kidney disease   . Diabetes mellitus without mention of complication, type II or unspecified type, not stated as uncontrolled (HCC)   . Essential hypertension   . Long term use of nonsteroidal anti-inflammatories   . Sarcoidosis     Past Surgical History No past surgical history on file.  Family History Family History  Problem Relation Age of Onset  . Cancer Mother        throat  . Diabetes Father   . Hypertension Father   . Diabetes Brother   . Alcohol abuse Brother     Social History Social History   Tobacco Use  . Smoking status: Former    Current packs/day: 1.00    Types: Cigarettes  . Smokeless tobacco: Never  . Tobacco comments:    Quit smoking years ago  Substance Use Topics  . Alcohol use: Never    Review of Systems  Constitutional:  Negative for chills and fever.  HENT:  Negative for congestion, ear pain, hearing loss and sore throat.   Eyes:  Negative for pain and discharge.  Respiratory:  Negative for cough, shortness of breath and wheezing.   Cardiovascular:  Negative for chest pain, palpitations and leg swelling.  Gastrointestinal:  Negative for abdominal pain, blood in stool, constipation, diarrhea, nausea and vomiting.  Genitourinary:  Negative for dysuria, frequency, hematuria and urgency.  Musculoskeletal:  Negative for back pain, myalgias and neck pain.  Skin:  Negative for rash.  Neurological:  Negative for dizziness, tremors and headaches.  Endo/Heme/Allergies:  Negative for polydipsia. Does not bruise/bleed easily.    Medication List Current Outpatient Medications  Medication Sig Dispense Refill  . albuterol  (2.5 MG/3ML) 0.083% nebulizer solution Take 2.5 mg by nebulization every 6 (six) hours if needed    . atorvastatin (LIPITOR) 40 MG tablet Take 40 mg by mouth at bed time    . Breztri Aerosphere 160-9-4.8 MCG/ACT aerosol Inhale 2 puffs in the morning and 2 puffs  in the evening.    . clopidogrel (PLAVIX) 75 MG tablet Take 75 mg by mouth 1 (one) time each day    . co-enzyme Q-10 30 MG capsule Take 30 mg by mouth 1 (one) time each day    . Cyanocobalamin  5000 MCG capsule Take 5,000 mcg by mouth 1 (one) time each day    . Dapagliflozin Propanediol (Farxiga) 10 MG tablet Take 10 mg by mouth 1 (one) time each day    . DULoxetine (CYMBALTA) 60 MG DR capsule Take 60 mg by mouth 1 (one) time each day Do not crush or chew.    . escitalopram  (LEXAPRO ) 10 MG tablet Take 10 mg by mouth 1 (one) time each day    . fenofibrate (TRIGLIDE) 160 MG tablet Take 160 mg by mouth 1 (one) time each day    . ferrous sulfate 325 (65 Fe) MG EC tablet Take 325 mg by mouth in the morning and 325 mg in the evening. Do not crush, chew, or split.    SABRA gabapentin (NEURONTIN) 300 MG capsule Take 300 mg by mouth 1 (one) time each day (Patient taking differently: Take 300 mg by mouth in the morning and 300 mg in the evening and 300 mg before bedtime.)    . linaCLOtide  (Linzess ) 290 MCG capsule Take 1 capsule by mouth 1 (one) time each day    . omega-3 (FISH OIL) 1000 MG capsule Take 1,000 mg by mouth 1 (one) time each day    . sodium bicarbonate 650 MG tablet Take 650 mg by mouth 1 (one) time each day (Patient taking differently: Take 650 mg by mouth in the morning and 650 mg in the evening.)    . traZODone (DESYREL) 50 MG tablet Take 50 mg by mouth 1 (one) time each day if needed    . metoprolol tartrate 25 MG tablet Take 0.5 tablets (12.5 mg total) by mouth in the morning and 0.5 tablets (12.5 mg total) in the evening. 45 tablet 2   No current facility-administered medications for this visit.     Allergy List Allergies  Allergen Reactions  . Pineapple Anaphylaxis and Other (see comments)    Mouth itches and burns.   Mouth itches and burns.  . Shellfish Allergy Anaphylaxis  . Codeine  Other (see comments)    Other Reaction(s): other  . Other Itching    pinapple  . Tramadol       itching  Other Reaction(s): Not available  . Shellfish Protein-Containing Drug Products Other (see comments) and Rash    Physical Exam Vitals:   01/21/24 1536 01/21/24 1544  BP: 154/80 151/74  Pulse: 91 (!) 112  Temp: 97 F   Height: 5' 6 (1.676 m)     Vitals reviewed. Constitutional: She is  oriented to person, place, and time. She does not appear ill. No distress.  Eyes: Conjunctivae are normal. Right eye exhibits no discharge. Left eye exhibits no discharge. No scleral icterus.  Neck: No thyroid  mass and no thyromegaly present.  Cardiovascular:  Normal rate and regular rhythm.      Exam reveals no friction rub.      No murmur heard.She exhibits no edema.  Pulmonary/Chest: Effort normal and breath sounds normal. No respiratory distress. She has no wheezes. She has no rales.  Abdominal: Soft. There is no abdominal tenderness. No hernia.  Musculoskeletal: Normal range of motion. She exhibits no deformity.  Neurological: She is alert and oriented to person, place, and time.  Skin: Skin is warm and dry. No rash noted. She is not diaphoretic. No erythema.  Psychiatric: She has a normal mood and affect. Her behavior is normal. Judgment normal.    Labs Chemistry  Lab Units 12/12/23 1529 12/10/23 0948 09/16/23 1005 08/13/23 1144 04/02/23 0000 04/17/22 0525  CREATININE mg/dL 8.39*  --  8.04* 8.60*  --  1.04  BUN mg/dL 23  --  34* 27*  --  17  POTASSIUM mmol/L 4.1  --  4.7 5.6*  --  3.7  SODIUM mmol/L 141  --  141 138  --  139  CO2 mmol/L 27  --  24 22  --  27.2  CHLORIDE mmol/L 105  --  104 107  --  101  ALBUMIN g/dL 4.4 4.5 4.5 4.6  --  3.6  EGFR mL/min/1.11m2 37*  --  29* 44*  --  62  HEMOGLOBIN A1C   --   --   --   --  6.1  --   WBC AUTO Thousand/uL 7.1  --   --   --   --   --   HEMATOCRIT % 31.6*  --   --   --   --   --   HEMOGLOBIN g/dL 89.8*  --   --   --   --   --   PLATELETS AUTO Thousand/uL 282  --   --   --   --   --      Bone Mineral  Lab Units  12/12/23 1529 09/16/23 1005 08/13/23 1144 04/17/22 0525  CALCIUM mg/dL 9.6 9.7 89.9 9.7  PHOSPHORUS mg/dL 3.7 4.7* 3.7  --   ALK PHOS U/L  --   --   --  68     Urine  Lab Units 12/12/23 1529 12/05/23 1059 08/13/23 1144  PROT/CREAT RATIO UR mg/g creat  --  NOTE  NOTE  --   ALB MG/G CREAT UR mg/g creat 9  --  4     November 28, 2023 from PCP  BUN 43 creatinine 2.7 GFR 19  potassium 5.7 CO2 19 calcium 9.9  hemoglobin 10.4 hematocrit 33 iron sat 8% ferritin 48 B12 more than 2000 folic acid  6.3                No LOS data to display    Ida KATHEE Sprung, MD

## 2024-01-27 DIAGNOSIS — J449 Chronic obstructive pulmonary disease, unspecified: Secondary | ICD-10-CM | POA: Diagnosis not present

## 2024-01-27 DIAGNOSIS — I10 Essential (primary) hypertension: Secondary | ICD-10-CM | POA: Diagnosis not present

## 2024-01-27 DIAGNOSIS — R001 Bradycardia, unspecified: Secondary | ICD-10-CM | POA: Diagnosis not present

## 2024-01-27 DIAGNOSIS — E119 Type 2 diabetes mellitus without complications: Secondary | ICD-10-CM | POA: Diagnosis not present

## 2024-01-27 DIAGNOSIS — Z87891 Personal history of nicotine dependence: Secondary | ICD-10-CM | POA: Diagnosis not present

## 2024-01-27 DIAGNOSIS — Z79899 Other long term (current) drug therapy: Secondary | ICD-10-CM | POA: Diagnosis not present

## 2024-01-27 DIAGNOSIS — Z7982 Long term (current) use of aspirin: Secondary | ICD-10-CM | POA: Diagnosis not present

## 2024-01-27 DIAGNOSIS — Z8673 Personal history of transient ischemic attack (TIA), and cerebral infarction without residual deficits: Secondary | ICD-10-CM | POA: Diagnosis not present

## 2024-01-27 DIAGNOSIS — R918 Other nonspecific abnormal finding of lung field: Secondary | ICD-10-CM | POA: Diagnosis not present

## 2024-01-27 DIAGNOSIS — R55 Syncope and collapse: Secondary | ICD-10-CM | POA: Diagnosis not present

## 2024-01-27 NOTE — ED Provider Notes (Signed)
 ------------------------------------------------------------------------------- Attestation signed by Cherie Ardeen Hanger, MD at 01/28/24 0032 I was the attending physician on duty, and was available in Emergency Department for any consultations.  I assumed care of this patient at shift change.  Please see my progress note for more.  I had face-to-face evaluation of this patient. Signed by Ardeen SHAUNNA Cherie, MD January 28, 2024 at 12:32 AM  -------------------------------------------------------------------------------                                                                                     Emergency Department Provider Note    ED Clinical Impression   Final diagnoses:  Syncope, unspecified syncope type (Primary)    ED Assessment/Plan    Condition: Stable Disposition: Pending  This chart has been completed using Dragon Medical Dictation software, and while attempts have been made to ensure accuracy, certain words and phrases may not be transcribed as intended.   History   Chief Complaint  Patient presents with  . Near Syncope   HPI  [name] is a 58 y.o. female  who presents today to the  emergency department via ems.  She reports she was getting her hair done when she began to feel weak, she was assisted to the ground by her daughter and then began vomiting and had urinary incontinence.  She is not sure if there was syncope.   She reports she feels better now although still weak. She denies any chest pain, palpitations, headache or other symptoms.   She reports she did start a new medication this past Friday, farxiga 5mg , and metoprolol 12.5mg  daily at the same time. She does not appear to be in distress at this time.     Allergies: is allergic to codeine , tramadol , and shellfish containing products. Medications: has a current medication list which includes the following long-term medication(s): amlodipine , clopidogrel, gabapentin, metoprolol tartrate,  albuterol , aspirin, atorvastatin, and furosemide. PMHx:  has a past medical history of COPD (chronic obstructive pulmonary disease)    (CMS-HCC), Diabetes mellitus    (CMS-HCC), Hypertension, Sarcoidosis, and Stroke    (CMS-HCC). PSHx:  has a past surgical history that includes Tubal ligation; Cholecystectomy; and Breast biopsy (Left, 05/27/2020). SocHx:  reports that she quit smoking about 16 years ago. Her smoking use included cigarettes. She started smoking about 46 years ago. She has a 30 pack-year smoking history. She has never been exposed to tobacco smoke. She has never used smokeless tobacco. She reports that she does not drink alcohol and does not use drugs. Allergies, Medications, Medical, Surgical, and Social History were reviewed as documented above.   Social Drivers of Health with Concerns   Food Insecurity: Food Insecurity Present (08/20/2023)   Received from [facility]   Hunger Vital Sign   . Within the past 12 months, you worried that your food would run out before you got the money to buy more.: Sometimes true   . Within the past 12 months, the food you bought just didn't last and you didn't have money to get more.: Sometimes true  Tobacco Use: Medium Risk (01/21/2024)   Received from [facility]   Patient History   . Smoking Tobacco Use:  Former   . Smokeless Tobacco Use: Never   . Passive Exposure: Not on file  Alcohol Use: Not on file  Housing: Not on file  Physical Activity: Insufficiently Active (08/20/2023)   Received from [facility]   Exercise Vital Sign   . On average, how many days per week do you engage in moderate to strenuous exercise (like a brisk walk)?: 2 days   . On average, how many minutes do you engage in exercise at this level?: 30 min  Interpersonal Safety: Not on file  Substance Use: Not on file (02/06/2023)  Social Connections: Unknown (08/20/2023)   Received from [facility]   Social Connection and Isolation Panel   . In a typical week, how  many times do you talk on the phone with family, friends, or neighbors?: More than three times a week   . How often do you get together with friends or relatives?: More than three times a week   . How often do you attend church or religious services?: More than 4 times per year   . Do you belong to any clubs or organizations such as church groups, unions, fraternal or athletic groups, or school groups?: No   . How often do you attend meetings of the clubs or organizations you belong to?: Never   . Are you married, widowed, divorced, separated, never married, or living with a partner?: Patient declined  Physicist, Medical Strain: Medium Risk (08/20/2023)   Received from [facility]   Overall Financial Resource Strain (CARDIA)   . Difficulty of Paying Living Expenses: Somewhat hard  Health Literacy: Not on file  Internet Connectivity: Not on file     Review Of Systems  Review of Systems  Constitutional:  Negative for chills and fever.  Respiratory:  Negative for shortness of breath.   Cardiovascular:  Negative for chest pain.  Gastrointestinal:  Negative for abdominal pain, diarrhea, nausea and vomiting.  Genitourinary:  Negative for dysuria.  Musculoskeletal:  Negative for back pain.  Neurological:  Positive for syncope and weakness. Negative for dizziness and headaches.    Physical Exam   BP 134/62   Pulse 54   Temp 36.7 C (98.1 F) (Oral)   Resp 12   Ht 167.6 cm (5' 6)   Wt 80.3 kg (177 lb 0.1 oz)   SpO2 100%   BMI 28.57 kg/m   Physical Exam Vitals and nursing note reviewed.  Constitutional:      Appearance: Normal appearance. She is normal weight.  Cardiovascular:     Rate and Rhythm: Regular rhythm. Bradycardia present.     Heart sounds: Normal heart sounds.  Pulmonary:     Effort: Pulmonary effort is normal.     Breath sounds: Normal breath sounds.  Abdominal:     General: Bowel sounds are normal.     Palpations: Abdomen is soft.  Skin:    General: Skin is  warm and dry.     Capillary Refill: Capillary refill takes 2 to 3 seconds.  Neurological:     General: No focal deficit present.     Mental Status: She is alert and oriented to person, place, and time.  Psychiatric:        Mood and Affect: Mood normal.        Behavior: Behavior normal.     ED Course  Medical Decision Making Amount and/or Complexity of Data Reviewed Labs: ordered. Decision-making details documented in ED Course. Radiology: ordered. Decision-making details documented in ED Course. ECG/medicine tests:  ordered. Decision-making details documented in ED Course.    ED Course as of 01/27/24 2250  Mon Jan 27, 2024  2140 ECG 12 Lead Sinus bradycardia 46 BPM, no acute changes  2222 Heart rate was markedly slow on initial EKG, beta blocker is a possible cause of her symptoms.  2242 XR Chest Portable Images and report reviewed:  IMPRESSION:   Focal linear atelectasis or scarring in the right upper lung. No additional acute findings.      2246 CBC w/ Differential(!):   WBC 8.3  RBC 3.98  HGB 11.3(!)  HCT 35.6  MCV 89.4  MCH 28.4  MCHC 31.7(!)  RDW 12.1  MPV 11.6(!)  Platelet 280  Neutrophils % 54.7  Lymphocytes % 33.9  Monocytes % 9.7  Eosinophils % 0.8  Basophils % 0.5  Absolute Neutrophils 4.6  Absolute Lymphocytes 2.8  Absolute Monocytes  0.8  Absolute Eosinophils 0.1  Absolute Basophils  0.0  2250 Care transferred to Dr Cherie due to end of shift     Procedures   Encounter Date: 01/27/24  ECG 12 Lead  Result Value   EKG Systolic BP    EKG Diastolic BP    EKG Ventricular Rate 46   EKG Atrial Rate 46   EKG P-R Interval 188   EKG QRS Duration 90   EKG Q-T Interval 474   EKG QTC Calculation 414   EKG Calculated P Axis 54   EKG Calculated R Axis 60   EKG Calculated T Axis 66   QTC Fredericia 433     ED Results Results for orders placed or performed during the hospital encounter of 01/27/24  ECG 12 Lead  Result Value Ref Range   EKG  Systolic BP  mmHg   EKG Diastolic BP  mmHg   EKG Ventricular Rate 46 BPM   EKG Atrial Rate 46 BPM   EKG P-R Interval 188 ms   EKG QRS Duration 90 ms   EKG Q-T Interval 474 ms   EKG QTC Calculation 414 ms   EKG Calculated P Axis 54 degrees   EKG Calculated R Axis 60 degrees   EKG Calculated T Axis 66 degrees   QTC Fredericia 433 ms  CBC w/ Differential  Result Value Ref Range   WBC 8.3 4.0 - 10.5 10*9/L   RBC 3.98 3.80 - 5.10 10*12/L   HGB 11.3 (L) 11.5 - 15.0 g/dL   HCT 64.3 65.9 - 55.9 %   MCV 89.4 80.0 - 98.0 fL   MCH 28.4 27.0 - 34.0 pg   MCHC 31.7 (L) 32.0 - 36.0 g/dL   RDW 87.8 88.4 - 85.4 %   MPV 11.6 (H) 7.4 - 10.4 fL   Platelet 280 140 - 415 10*9/L   Neutrophils % 54.7 %   Lymphocytes % 33.9 %   Monocytes % 9.7 %   Eosinophils % 0.8 %   Basophils % 0.5 %   Absolute Neutrophils 4.6 1.8 - 7.8 10*9/L   Absolute Lymphocytes 2.8 0.7 - 4.5 10*9/L   Absolute Monocytes 0.8 0.1 - 1.0 10*9/L   Absolute Eosinophils 0.1 0.0 - 0.4 10*9/L   Absolute Basophils 0.0 0.0 - 0.2 10*9/L   XR Chest Portable Result Date: 01/27/2024 Exam:  Portable Chest  History:  Syncope    Technique: Single frontal view of the chest.  Comparison:  11/28/2023    Findings:  Focal linear atelectasis or scarring in the right upper lung. There is no pleural effusion or pneumothorax. Mediastinal contours are  unremarkable. Pulmonary vasculature appears normal.        Focal linear atelectasis or scarring in the right upper lung. No additional acute findings.      Signed (Electronic Signature): 01/27/2024 10:29 PM Signed By: Emeline KATHEE Milroy, MD   Medications Administered: Medications - No data to display  Discharge Medications (Medications Prescribed during this  ED visit and Patient's Home Medications) :    Your Medication List     STOP taking these medications    diclofenac sodium 1 % gel Commonly known as: VOLTAREN   naproxen 500 MG tablet Commonly known as: NAPROSYN       ASK your doctor about  these medications    albuterol  90 mcg/actuation inhaler Commonly known as: PROVENTIL  HFA;VENTOLIN  HFA inhale 2 puffs by mouth every 4 to 6 hours as needed   amlodipine  10 MG tablet Commonly known as: NORVASC  Take 1 tablet (10 mg total) by mouth daily.   aspirin 81 MG tablet Commonly known as: ECOTRIN Take 1 tablet (81 mg total) by mouth daily.   atorvastatin 40 MG tablet Commonly known as: LIPITOR Take 1 tablet (40 mg total) by mouth nightly.   clopidogrel 75 mg tablet Commonly known as: PLAVIX Take 1 tablet (75 mg total) by mouth daily.   cyanocobalamin  (vitamin B-12) 5,000 mcg Cap Take 5,000 mcg by mouth.   FARXIGA 5 mg Tab tablet Generic drug: dapagliflozin propanediol Take 1 tablet (5 mg total) by mouth every morning.   ferrous sulfate 325 (65 FE) MG tablet Take 1 tablet (325 mg total) by mouth.   furosemide 20 MG tablet Commonly known as: LASIX Take 1 tablet (20 mg total) by mouth daily.   gabapentin 300 MG capsule Commonly known as: NEURONTIN Take 1 capsule (300 mg total) by mouth Three (3) times a day.   LINZESS  290 mcg capsule Generic drug: linaclotide  Take 1 capsule (290 mcg total) by mouth daily.   metoPROLOL tartrate 25 MG tablet Commonly known as: Lopressor Take 0.5 tablets (12.5 mg total) by mouth.   sodium bicarbonate 650 mg tablet Take 1 tablet (650 mg total) by mouth two (2) times a day.          Gerome Rosaline Ruth, FNP 01/27/24 2250

## 2024-01-29 DIAGNOSIS — R55 Syncope and collapse: Secondary | ICD-10-CM | POA: Diagnosis not present

## 2024-01-29 DIAGNOSIS — J440 Chronic obstructive pulmonary disease with acute lower respiratory infection: Secondary | ICD-10-CM | POA: Diagnosis not present

## 2024-01-29 DIAGNOSIS — R5383 Other fatigue: Secondary | ICD-10-CM | POA: Diagnosis not present

## 2024-01-29 DIAGNOSIS — N184 Chronic kidney disease, stage 4 (severe): Secondary | ICD-10-CM | POA: Diagnosis not present

## 2024-01-29 DIAGNOSIS — E1122 Type 2 diabetes mellitus with diabetic chronic kidney disease: Secondary | ICD-10-CM | POA: Diagnosis not present
